# Patient Record
Sex: Male | Born: 1965 | Race: White | Hispanic: No | Marital: Single | Smoking: Current every day smoker
Health system: Southern US, Community
[De-identification: ages and names within clinical notes are randomized; demographics above are authoritative.]

## PROBLEM LIST (undated history)

## (undated) DIAGNOSIS — I619 Nontraumatic intracerebral hemorrhage, unspecified: Secondary | ICD-10-CM

## (undated) DIAGNOSIS — B1920 Unspecified viral hepatitis C without hepatic coma: Secondary | ICD-10-CM

## (undated) DIAGNOSIS — J45909 Unspecified asthma, uncomplicated: Secondary | ICD-10-CM

## (undated) DIAGNOSIS — Z21 Asymptomatic human immunodeficiency virus [HIV] infection status: Secondary | ICD-10-CM

## (undated) DIAGNOSIS — B20 Human immunodeficiency virus [HIV] disease: Secondary | ICD-10-CM

## (undated) DIAGNOSIS — D66 Hereditary factor VIII deficiency: Secondary | ICD-10-CM

## (undated) HISTORY — PX: HERNIA REPAIR: SHX51

## (undated) HISTORY — PX: OTHER SURGICAL HISTORY: SHX169

## (undated) HISTORY — PX: MANDIBLE SURGERY: SHX707

---

## 1983-12-05 DIAGNOSIS — B20 Human immunodeficiency virus [HIV] disease: Secondary | ICD-10-CM

## 1983-12-05 HISTORY — DX: Human immunodeficiency virus (HIV) disease: B20

## 1998-12-04 DIAGNOSIS — I619 Nontraumatic intracerebral hemorrhage, unspecified: Secondary | ICD-10-CM

## 1998-12-04 HISTORY — DX: Nontraumatic intracerebral hemorrhage, unspecified: I61.9

## 2013-07-06 ENCOUNTER — Emergency Department (HOSPITAL_COMMUNITY)
Admission: EM | Admit: 2013-07-06 | Discharge: 2013-07-06 | Disposition: A | Discharge status: Home / Self Care | Payer: Self-pay | Attending: Emergency Medicine | Admitting: Emergency Medicine

## 2013-07-06 ENCOUNTER — Encounter (HOSPITAL_COMMUNITY): Payer: Self-pay | Admitting: Emergency Medicine

## 2013-07-06 ENCOUNTER — Emergency Department (HOSPITAL_COMMUNITY): Payer: Self-pay

## 2013-07-06 DIAGNOSIS — Z760 Encounter for issue of repeat prescription: Secondary | ICD-10-CM | POA: Insufficient documentation

## 2013-07-06 DIAGNOSIS — F17200 Nicotine dependence, unspecified, uncomplicated: Secondary | ICD-10-CM | POA: Insufficient documentation

## 2013-07-06 DIAGNOSIS — S0990XA Unspecified injury of head, initial encounter: Secondary | ICD-10-CM | POA: Insufficient documentation

## 2013-07-06 HISTORY — DX: Unspecified viral hepatitis C without hepatic coma: B19.20

## 2013-07-06 HISTORY — DX: Nontraumatic intracerebral hemorrhage, unspecified: I61.9

## 2013-07-06 HISTORY — DX: Unspecified asthma, uncomplicated: J45.909

## 2013-07-06 HISTORY — DX: Human immunodeficiency virus (HIV) disease: B20

## 2013-07-06 HISTORY — DX: Asymptomatic human immunodeficiency virus (hiv) infection status: Z21

## 2013-07-06 MED ORDER — ALBUTEROL SULFATE HFA 108 (90 BASE) MCG/ACT IN AERS: 2.0000 | INHALATION_SPRAY | RESPIRATORY_TRACT | Status: DC | PRN

## 2013-07-06 MED ORDER — RITONAVIR 100 MG PO CAPS: 100.0000 mg | ORAL_CAPSULE | Freq: Two times a day (BID) | ORAL | Status: DC

## 2013-07-06 MED ORDER — TEMAZEPAM 7.5 MG PO CAPS: 15.0000 mg | ORAL_CAPSULE | Freq: Every evening | ORAL | Status: DC | PRN

## 2013-07-06 MED ORDER — CYCLOBENZAPRINE HCL 10 MG PO TABS: 5.0000 mg | ORAL_TABLET | Freq: Two times a day (BID) | ORAL | Status: DC | PRN

## 2013-07-06 MED ORDER — DARUNAVIR ETHANOLATE 400 MG PO TABS: 800.0000 mg | ORAL_TABLET | Freq: Every day | ORAL | Status: DC

## 2013-07-06 MED ORDER — EMTRICITABINE-TENOFOVIR DF 200-300 MG PO TABS
1.0000 | ORAL_TABLET | Freq: Every day | ORAL | Status: DC
Start: 2013-07-06 — End: 2013-07-10

## 2013-07-06 MED ORDER — SULFAMETHOXAZOLE-TRIMETHOPRIM 800-160 MG PO TABS: 1.0000 | ORAL_TABLET | Freq: Every day | ORAL | Status: DC

## 2013-07-06 MED ORDER — OXYCODONE-ACETAMINOPHEN 5-325 MG PO TABS: ORAL_TABLET | ORAL | Status: DC

## 2013-07-06 NOTE — ED Provider Notes (Signed)
Medical screening examination/treatment/procedure(s) were performed by non-physician practitioner and as supervising physician I was immediately available for consultation/collaboration.   Charles B. Sheldon, MD 07/06/13 1805 
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
## 2013-07-10 ENCOUNTER — Emergency Department (HOSPITAL_COMMUNITY)
Admission: EM | Admit: 2013-07-10 | Discharge: 2013-07-10 | Disposition: A | Discharge status: Home / Self Care | Payer: Self-pay | Attending: Emergency Medicine | Admitting: Emergency Medicine

## 2013-07-10 ENCOUNTER — Encounter (HOSPITAL_COMMUNITY): Payer: Self-pay | Admitting: Emergency Medicine

## 2013-07-10 DIAGNOSIS — J45909 Unspecified asthma, uncomplicated: Secondary | ICD-10-CM | POA: Insufficient documentation

## 2013-07-10 DIAGNOSIS — Z8619 Personal history of other infectious and parasitic diseases: Secondary | ICD-10-CM | POA: Insufficient documentation

## 2013-07-10 DIAGNOSIS — F411 Generalized anxiety disorder: Secondary | ICD-10-CM | POA: Insufficient documentation

## 2013-07-10 DIAGNOSIS — Z760 Encounter for issue of repeat prescription: Secondary | ICD-10-CM | POA: Insufficient documentation

## 2013-07-10 DIAGNOSIS — Z8679 Personal history of other diseases of the circulatory system: Secondary | ICD-10-CM | POA: Insufficient documentation

## 2013-07-10 DIAGNOSIS — F17200 Nicotine dependence, unspecified, uncomplicated: Secondary | ICD-10-CM | POA: Insufficient documentation

## 2013-07-10 DIAGNOSIS — R0602 Shortness of breath: Secondary | ICD-10-CM | POA: Insufficient documentation

## 2013-07-10 DIAGNOSIS — Z5900 Homelessness unspecified: Secondary | ICD-10-CM | POA: Insufficient documentation

## 2013-07-10 DIAGNOSIS — R4589 Other symptoms and signs involving emotional state: Secondary | ICD-10-CM

## 2013-07-10 DIAGNOSIS — B20 Human immunodeficiency virus [HIV] disease: Secondary | ICD-10-CM | POA: Insufficient documentation

## 2013-07-10 DIAGNOSIS — Z79899 Other long term (current) drug therapy: Secondary | ICD-10-CM | POA: Insufficient documentation

## 2013-07-10 MED ORDER — CYCLOBENZAPRINE HCL 10 MG PO TABS
5.0000 mg | ORAL_TABLET | Freq: Once | ORAL | Status: AC
  Administered 2013-07-10: 5 mg via ORAL
  Filled 2013-07-10: qty 1

## 2013-07-10 MED ORDER — OXYCODONE-ACETAMINOPHEN 5-325 MG PO TABS
2.0000 | ORAL_TABLET | Freq: Once | ORAL | Status: AC
  Administered 2013-07-10: 2 via ORAL
  Filled 2013-07-10: qty 2

## 2013-07-10 MED ORDER — ALBUTEROL SULFATE HFA 108 (90 BASE) MCG/ACT IN AERS
2.0000 | INHALATION_SPRAY | RESPIRATORY_TRACT | Status: DC | PRN
  Administered 2013-07-10: 2 via RESPIRATORY_TRACT
  Filled 2013-07-10: qty 6.7

## 2013-07-10 MED ORDER — SULFAMETHOXAZOLE-TMP DS 800-160 MG PO TABS
1.0000 | ORAL_TABLET | Freq: Once | ORAL | Status: AC
  Administered 2013-07-10: 1 via ORAL
  Filled 2013-07-10: qty 1
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
## 2013-07-11 NOTE — ED Provider Notes (Signed)
Medical screening examination/treatment/procedure(s) were performed by non-physician practitioner and as supervising physician I was immediately available for consultation/collaboration.   Shanna Cisco, MD 07/11/13 1135

## 2013-07-14 DIAGNOSIS — J45909 Unspecified asthma, uncomplicated: Secondary | ICD-10-CM | POA: Diagnosis present

## 2013-07-14 DIAGNOSIS — R7309 Other abnormal glucose: Secondary | ICD-10-CM | POA: Diagnosis present

## 2013-07-14 DIAGNOSIS — R42 Dizziness and giddiness: Secondary | ICD-10-CM | POA: Diagnosis present

## 2013-07-14 DIAGNOSIS — Z886 Allergy status to analgesic agent status: Secondary | ICD-10-CM

## 2013-07-14 DIAGNOSIS — B20 Human immunodeficiency virus [HIV] disease: Principal | ICD-10-CM | POA: Diagnosis present

## 2013-07-14 DIAGNOSIS — R05 Cough: Secondary | ICD-10-CM | POA: Diagnosis present

## 2013-07-14 DIAGNOSIS — Z8673 Personal history of transient ischemic attack (TIA), and cerebral infarction without residual deficits: Secondary | ICD-10-CM

## 2013-07-14 DIAGNOSIS — B1920 Unspecified viral hepatitis C without hepatic coma: Secondary | ICD-10-CM | POA: Diagnosis present

## 2013-07-14 DIAGNOSIS — R059 Cough, unspecified: Secondary | ICD-10-CM | POA: Diagnosis present

## 2013-07-14 DIAGNOSIS — Z8249 Family history of ischemic heart disease and other diseases of the circulatory system: Secondary | ICD-10-CM

## 2013-07-14 DIAGNOSIS — F17200 Nicotine dependence, unspecified, uncomplicated: Secondary | ICD-10-CM | POA: Diagnosis present

## 2013-07-14 DIAGNOSIS — IMO0002 Reserved for concepts with insufficient information to code with codable children: Secondary | ICD-10-CM

## 2013-07-14 DIAGNOSIS — R209 Unspecified disturbances of skin sensation: Secondary | ICD-10-CM | POA: Diagnosis present

## 2013-07-14 DIAGNOSIS — D61818 Other pancytopenia: Secondary | ICD-10-CM | POA: Diagnosis present

## 2013-07-15 ENCOUNTER — Emergency Department (HOSPITAL_COMMUNITY): Payer: Self-pay

## 2013-07-15 ENCOUNTER — Inpatient Hospital Stay (HOSPITAL_COMMUNITY)
Admission: EM | Admit: 2013-07-15 | Discharge: 2013-07-16 | DRG: 977 | Disposition: A | Discharge status: Home / Self Care | Payer: MEDICAID | Attending: Internal Medicine | Admitting: Internal Medicine

## 2013-07-15 ENCOUNTER — Observation Stay (HOSPITAL_COMMUNITY): Payer: Self-pay

## 2013-07-15 ENCOUNTER — Encounter (HOSPITAL_COMMUNITY): Payer: Self-pay | Admitting: Emergency Medicine

## 2013-07-15 DIAGNOSIS — R200 Anesthesia of skin: Secondary | ICD-10-CM | POA: Diagnosis present

## 2013-07-15 DIAGNOSIS — R209 Unspecified disturbances of skin sensation: Secondary | ICD-10-CM

## 2013-07-15 DIAGNOSIS — B20 Human immunodeficiency virus [HIV] disease: Secondary | ICD-10-CM

## 2013-07-15 DIAGNOSIS — Z21 Asymptomatic human immunodeficiency virus [HIV] infection status: Secondary | ICD-10-CM

## 2013-07-15 DIAGNOSIS — I6789 Other cerebrovascular disease: Secondary | ICD-10-CM

## 2013-07-15 LAB — LIPID PANEL
HDL: 30 mg/dL — ABNORMAL LOW (ref >39)
LDL Cholesterol: 44 mg/dL (ref 0–99)
Triglycerides: 288 mg/dL — ABNORMAL HIGH (ref <150)
VLDL: 58 mg/dL — ABNORMAL HIGH (ref 0–40)

## 2013-07-15 LAB — CBC
HCT: 33.4 % — ABNORMAL LOW (ref 39.0–52.0)
MCV: 94.6 fL (ref 78.0–100.0)
RBC: 3.53 MIL/uL — ABNORMAL LOW (ref 4.22–5.81)
WBC: 2.6 10*3/uL — ABNORMAL LOW (ref 4.0–10.5)

## 2013-07-15 LAB — COMPREHENSIVE METABOLIC PANEL
AST: 206 U/L — ABNORMAL HIGH (ref 0–37)
Albumin: 3.1 g/dL — ABNORMAL LOW (ref 3.5–5.2)
Calcium: 8.2 mg/dL — ABNORMAL LOW (ref 8.4–10.5)
Chloride: 100 mEq/L (ref 96–112)
Creatinine, Ser: 0.77 mg/dL (ref 0.50–1.35)
Total Bilirubin: 1.2 mg/dL (ref 0.3–1.2)

## 2013-07-15 LAB — CBC WITH DIFFERENTIAL/PLATELET
Basophils Relative: 3 % — ABNORMAL HIGH (ref 0–1)
Eosinophils Relative: 10 % — ABNORMAL HIGH (ref 0–5)
Hemoglobin: 12.6 g/dL — ABNORMAL LOW (ref 13.0–17.0)
Lymphs Abs: 0.7 10*3/uL (ref 0.7–4.0)
MCH: 34.8 pg — ABNORMAL HIGH (ref 26.0–34.0)
MCV: 95.9 fL (ref 78.0–100.0)
Monocytes Absolute: 0.4 10*3/uL (ref 0.1–1.0)
Neutro Abs: 0.6 10*3/uL — ABNORMAL LOW (ref 1.7–7.7)
RBC: 3.62 MIL/uL — ABNORMAL LOW (ref 4.22–5.81)

## 2013-07-15 LAB — POCT I-STAT, CHEM 8
BUN: 12 mg/dL (ref 6–23)
Calcium, Ion: 1.25 mmol/L — ABNORMAL HIGH (ref 1.12–1.23)
Chloride: 101 mEq/L (ref 96–112)
Glucose, Bld: 208 mg/dL — ABNORMAL HIGH (ref 70–99)
Potassium: 3.6 mEq/L (ref 3.5–5.1)

## 2013-07-15 MED ORDER — ENOXAPARIN SODIUM 40 MG/0.4ML ~~LOC~~ SOLN
40.0000 mg | Freq: Every day | SUBCUTANEOUS | Status: DC
  Administered 2013-07-15: 40 mg via SUBCUTANEOUS
  Filled 2013-07-15 (×2): qty 0.4

## 2013-07-15 MED ORDER — ADULT MULTIVITAMIN W/MINERALS CH
2.0000 | ORAL_TABLET | Freq: Every day | ORAL | Status: DC
  Administered 2013-07-15 – 2013-07-16 (×2): 2 via ORAL
  Filled 2013-07-15 (×2): qty 2

## 2013-07-15 MED ORDER — SULFAMETHOXAZOLE-TMP DS 800-160 MG PO TABS
1.0000 | ORAL_TABLET | Freq: Every day | ORAL | Status: DC
  Administered 2013-07-15 – 2013-07-16 (×2): 1 via ORAL
  Filled 2013-07-15 (×2): qty 1

## 2013-07-15 MED ORDER — HYPROMELLOSE (GONIOSCOPIC) 2.5 % OP SOLN: 1.0000 [drp] | OPHTHALMIC | Status: DC | PRN

## 2013-07-15 MED ORDER — SODIUM CHLORIDE 0.9 % IV SOLN
INTRAVENOUS | Status: AC
  Administered 2013-07-15: 05:00:00 via INTRAVENOUS

## 2013-07-15 MED ORDER — TEMAZEPAM 15 MG PO CAPS
15.0000 mg | ORAL_CAPSULE | Freq: Every evening | ORAL | Status: DC | PRN
  Administered 2013-07-15: 15 mg via ORAL
  Filled 2013-07-15: qty 1

## 2013-07-15 MED ORDER — SODIUM CHLORIDE 0.9 % IV SOLN
INTRAVENOUS | Status: DC
  Administered 2013-07-15: 02:00:00 via INTRAVENOUS

## 2013-07-15 MED ORDER — ALBUTEROL SULFATE HFA 108 (90 BASE) MCG/ACT IN AERS
2.0000 | INHALATION_SPRAY | RESPIRATORY_TRACT | Status: DC | PRN
Start: 2013-07-15 — End: 2013-07-16
  Administered 2013-07-16: 2 via RESPIRATORY_TRACT
  Filled 2013-07-15: qty 6.7

## 2013-07-15 MED ORDER — OXYCODONE-ACETAMINOPHEN 5-325 MG PO TABS
2.0000 | ORAL_TABLET | Freq: Four times a day (QID) | ORAL | Status: DC | PRN
  Administered 2013-07-15 – 2013-07-16 (×5): 2 via ORAL
  Filled 2013-07-15 (×5): qty 2

## 2013-07-15 MED ORDER — SENNOSIDES-DOCUSATE SODIUM 8.6-50 MG PO TABS
1.0000 | ORAL_TABLET | Freq: Every evening | ORAL | Status: DC | PRN
  Filled 2013-07-15: qty 1

## 2013-07-15 MED ORDER — SODIUM CHLORIDE 0.9 % IV SOLN: INTRAVENOUS | Status: DC

## 2013-07-15 MED ORDER — RITONAVIR 100 MG PO CAPS
100.0000 mg | ORAL_CAPSULE | Freq: Every day | ORAL | Status: DC
  Administered 2013-07-15 – 2013-07-16 (×2): 100 mg via ORAL
  Filled 2013-07-15 (×3): qty 1

## 2013-07-15 MED ORDER — DARUNAVIR ETHANOLATE 800 MG PO TABS
800.0000 mg | ORAL_TABLET | Freq: Every day | ORAL | Status: DC
  Administered 2013-07-15 – 2013-07-16 (×2): 800 mg via ORAL
  Filled 2013-07-15 (×3): qty 1

## 2013-07-15 MED ORDER — CYCLOBENZAPRINE HCL 5 MG PO TABS
5.0000 mg | ORAL_TABLET | Freq: Two times a day (BID) | ORAL | Status: DC | PRN
  Administered 2013-07-15 – 2013-07-16 (×3): 5 mg via ORAL
  Filled 2013-07-15 (×7): qty 1

## 2013-07-15 MED ORDER — EMTRICITABINE-TENOFOVIR DF 200-300 MG PO TABS
1.0000 | ORAL_TABLET | Freq: Every day | ORAL | Status: DC
  Administered 2013-07-15 – 2013-07-16 (×2): 1 via ORAL
  Filled 2013-07-15 (×2): qty 1
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
## 2013-07-15 NOTE — Consult Note (Signed)
Reason for Consult: Recurrent episodes of electrical shocklike sensation involving his left side.  HPI:                                                                                                                                          Steven [name] is an 47 y.o. male with HIV infection, hepatitis C, asthma and history of cerebral hemorrhage, presenting with recurrent spells of brief electric-like shocks involving his left side for about one week. Symptoms involved arm and leg initially but now also involved left side of his face. Duration is one second or less. He's had one episode of feeling dizzy prior to onset of electric shocklike sensation. CT scan of his head showed signs of mild cortical atrophy and white matter ischemic changes. No acute findings were noted. Patient has not been on antiplatelet therapy and is allergic to aspirin.  Past Medical History  Diagnosis Date  . HIV (human immunodeficiency virus infection)   . Hepatitis C   . Asthma   . AIDS   . Cerebral hemorrhage     Past Surgical History  Procedure Laterality Date  . Hernia repair    . Mandible surgery    . Laporotomy      Family History  Problem Relation Age of Onset  . CAD Mother   . Stroke Mother     Social History:  reports that he has been smoking.  He does not have any smokeless tobacco history on file. He reports that he does not drink alcohol or use illicit drugs.  Allergies  Allergen Reactions  . Aspirin     MEDICATIONS:                                                                                                                     I have reviewed the patient's current medications.   ROS:  History obtained from the patient  General ROS: negative for - chills, fatigue, fever, night sweats; positive for weight loss Psychological ROS: negative for -  behavioral disorder, hallucinations, memory difficulties, mood swings or suicidal ideation Ophthalmic ROS: negative for - blurry vision, double vision, eye pain or loss of vision ENT ROS: negative for - epistaxis, nasal discharge, oral lesions, sore throat, tinnitus or vertigo Allergy and Immunology ROS: negative for - hives or itchy/watery eyes Hematological and Lymphatic ROS: negative for - bleeding problems, bruising or swollen lymph nodes Endocrine ROS: negative for - galactorrhea, hair pattern changes, polydipsia/polyuria or temperature intolerance Respiratory ROS: negative for - cough, hemoptysis, shortness of breath or wheezing Cardiovascular ROS: negative for - chest pain, dyspnea on exertion, edema or irregular heartbeat Gastrointestinal ROS: negative for - abdominal pain, diarrhea, hematemesis, nausea/vomiting or stool incontinence Genito-Urinary ROS: negative for - dysuria, hematuria, incontinence or urinary frequency/urgency Musculoskeletal ROS: negative for - joint swelling or muscular weakness Neurological ROS: as noted in HPI Dermatological ROS: negative for rash and skin lesion changes   Blood pressure 124/80, pulse 67, temperature 97.6 F (36.4 C), temperature source Oral, resp. rate 18, height 5\' 11"  (1.803 m), weight 77.52 kg (170 lb 14.4 oz), SpO2 100.00%.   Neurologic Examination:                                                                                                      Mental Status: Alert, oriented, thought content appropriate.  Speech fluent without evidence of aphasia. Able to follow commands without difficulty. Cranial Nerves: II-Visual fields were normal. III/IV/VI-Pupils were equal and reacted. Extraocular movements were full and conjugate.    V/VII-minimal left lower facial numbness; no facial weakness. VIII-normal. X-normal speech and symmetrical palatal movement. Motor: 5/5 bilaterally with normal tone and bulk Sensory: Normal throughout. Deep  Tendon Reflexes: Trace only and symmetric. Plantars: Flexor bilaterally Cerebellar: Normal finger-to-nose testing.  No results found for this basename: cbc, bmp, coags, chol, tri, ldl, hga1c    Results for orders placed during the hospital encounter of 07/15/13 (from the past 48 hour(s))  POCT I-STAT, CHEM 8     Status: Abnormal   Collection Time    07/15/13  1:41 AM      Result Value Range   Sodium 140  135 - 145 mEq/L   Potassium 3.6  3.5 - 5.1 mEq/L   Chloride 101  96 - 112 mEq/L   BUN 12  6 - 23 mg/dL   Creatinine, Ser 4.09  0.50 - 1.35 mg/dL   Glucose, Bld 811 (*) 70 - 99 mg/dL   Calcium, Ion 9.14 (*) 1.12 - 1.23 mmol/L   TCO2 25  0 - 100 mmol/L   Hemoglobin 13.3  13.0 - 17.0 g/dL   HCT 78.2  95.6 - 21.3 %  CBC     Status: Abnormal   Collection Time    07/15/13  1:42 AM      Result Value Range   WBC 2.6 (*) 4.0 - 10.5 K/uL   RBC 3.53 (*) 4.22 - 5.81 MIL/uL   Hemoglobin  12.3 (*) 13.0 - 17.0 g/dL   HCT 29.5 (*) 28.4 - 13.2 %   MCV 94.6  78.0 - 100.0 fL   MCH 34.8 (*) 26.0 - 34.0 pg   MCHC 36.8 (*) 30.0 - 36.0 g/dL   RDW 44.0  10.2 - 72.5 %   Platelets 139 (*) 150 - 400 K/uL    Dg Chest 2 View  07/15/2013   *RADIOLOGY REPORT*  Clinical Data: Cough.  CHEST - 2 VIEW  Comparison: No priors.  Findings: Lung volumes are normal.  No consolidative airspace disease.  No pleural effusions.  No pneumothorax.  No pulmonary nodule or mass noted.  Pulmonary vasculature and the cardiomediastinal silhouette are within normal limits.  IMPRESSION: 1. No radiographic evidence of acute cardiopulmonary disease.   Original Report Authenticated By: Trudie Reed, M.D.   Ct Head Wo Contrast  07/15/2013   *RADIOLOGY REPORT*  Clinical Data: Unsteady gait, altered mental status  CT HEAD WITHOUT CONTRAST  Technique:  Contiguous axial images were obtained from the base of the skull through the vertex without contrast.  Comparison: 07/06/2013  Findings: Cortical volume loss noted with proportional  ventricular prominence.  Periventricular white matter hypodensity likely indicates small vessel ischemic change.  No acute hemorrhage, acute infarction, or mass lesion is identified.  No midline shift. Orbits and paranasal sinuses are intact.  IMPRESSION: No acute intracranial finding.  Chronic findings as above.   Original Report Authenticated By: Christiana Pellant, M.D.     Assessment/Plan: Etiology for presenting symptoms of brief positive sensory symptoms is unclear. New-onset focal seizure disorder will need . As well, recurrent TIAs as well as possible small right subcortical ischemic infarction will need to be ruled out.  Recommendations: 1. MRI of the brain to rule out possible acute stroke as well as indications of possible acute CNS infection. 2. EEG routine adult 3. Stroke workup if acute cerebral infarction is demonstrated on MRI 4. No anticonvulsant medication unless EEG shows indications of focal seizure activity, or increased risk for recurrent strokes.  C.R. Roseanne Reno, MD [facility] [phone]  07/15/2013, 5:55 AM
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
## 2013-07-15 NOTE — Progress Notes (Signed)
*  PRELIMINARY RESULTS* Vascular Ultrasound Carotid Duplex (Doppler) has been completed. Findings suggest 1-39% internal carotid artery stenosis bilaterally. Vertebral arteries are patent with antegrade flow.  07/15/2013 3:11 PM Gertie Fey, RVT, RDCS, RDMS

## 2013-07-15 NOTE — H&P (Signed)
[facility] History and Physical  [name] OZD:[id number] DOB: 1966/01/12 DOA: 07/15/2013  Referring physician: ER physician. PCP: No primary provider on file.  Specialists: Follows up with infectious disease clinic at [facility]  Chief Complaint: Left-sided numbness.  HPI: Steven [name] is a 47 y.o. male this history of HIV, hepatitis C and asthma presented to the ER because of left-sided numbness. Patient has been having recent facial numbness with tingling and numbness sensation in the left upper and lower extremities for last 4 days. Patient states that at times he has weakness of the extremities on the left side. Denies any headache visual changes difficulty speaking or swallowing. Patient states that he has had intracranial bleed in 2010. In the ER CT head was negative for any acute. Patient has been admitted for further management. Patient otherwise denies any chest pain shortness of breath nausea vomiting abdominal pain fever chills. Patient states that his last CD4 count done last month was less than 50.  Review of Systems: As presented in the history of presenting illness, rest negative.  Past Medical History  Diagnosis Date  . HIV (human immunodeficiency virus infection)   . Hepatitis C   . Asthma   . AIDS   . Cerebral hemorrhage    Past Surgical History  Procedure Laterality Date  . Hernia repair    . Mandible surgery    . Laporotomy     Social History:  reports that he has been smoking.  He does not have any smokeless tobacco history on file. He reports that he does not drink alcohol or use illicit drugs. Homeless. where does patient live-- Can do ADLs. Can patient participate in ADLs?  Allergies  Allergen Reactions  . Aspirin     Family History  Problem Relation Age of Onset  . CAD Mother   . Stroke Mother       Prior to Admission medications   Medication Sig Start Date End Date Taking? Authorizing Provider  albuterol (PROVENTIL  HFA;VENTOLIN HFA) 108 (90 BASE) MCG/ACT inhaler Inhale 2 puffs into the lungs every 2 (two) hours as needed for wheezing or shortness of breath (cough). 07/06/13  Yes Nicole Pisciotta, PA-C  cyclobenzaprine (FLEXERIL) 10 MG tablet Take 0.5 tablets (5 mg total) by mouth 2 (two) times daily as needed for muscle spasms. 07/06/13  Yes Nicole Pisciotta, PA-C  darunavir (PREZISTA) 400 MG tablet Take 2 tablets (800 mg total) by mouth daily with breakfast. 07/06/13  Yes Nicole Pisciotta, PA-C  emtricitabine-tenofovir (TRUVADA) 200-300 MG per tablet Take 1 tablet by mouth daily.   Yes Historical Provider, MD  hydroxypropyl methylcellulose (ISOPTO TEARS) 2.5 % ophthalmic solution Place 1 drop into both eyes as needed (for dryness).   Yes Historical Provider, MD  Multiple Vitamin (MULTIVITAMIN WITH MINERALS) TABS Take 2 tablets by mouth daily.   Yes Historical Provider, MD  oxyCODONE (OXY IR/ROXICODONE) 5 MG immediate release tablet Take 5 mg by mouth every 8 (eight) hours as needed for pain.   Yes Historical Provider, MD  oxyCODONE-acetaminophen (PERCOCET/ROXICET) 5-325 MG per tablet Take 2 tablets by mouth every 6 (six) hours as needed for pain.   Yes Historical Provider, MD  ritonavir (NORVIR) 100 MG capsule Take 100 mg by mouth daily.   Yes Historical Provider, MD  sulfamethoxazole-trimethoprim (SEPTRA DS) 800-160 MG per tablet Take 1 tablet by mouth daily. 07/06/13  Yes Nicole Pisciotta, PA-C  temazepam (RESTORIL) 7.5 MG capsule Take 2 capsules (15 mg total) by mouth at bedtime as needed for  sleep. 07/06/13  Yes Wynetta Emery, PA-C   Physical Exam: Filed Vitals:   07/15/13 0236 07/15/13 0300 07/15/13 0330 07/15/13 0345  BP:  120/89 131/80 145/86  Pulse:  59 53 69  Temp: 98.1 F (36.7 C)     TempSrc:      Resp:      SpO2:  98% 99% 98%     General:  Well-developed well-nourished.  Eyes: Anicteric no pallor.  ENT: No discharge from ears eyes nose mouth. Mild thrush seen.  Neck: No mass felt. No neck  rigidity.  Cardiovascular: S1-S2 heard.  Respiratory: No rhonchi or crepitations.  Abdomen: Soft nontender bowel sounds present.  Skin: No rash.  Musculoskeletal: No edema.  Psychiatric: Appears normal.  Neurologic: Alert awake oriented to time place and person. Moves all extremities.  Labs on Admission:  Basic Metabolic Panel:  Recent Labs Lab 07/15/13 0141  NA 140  K 3.6  CL 101  GLUCOSE 208*  BUN 12  CREATININE 0.80   Liver Function Tests: No results found for this basename: AST, ALT, ALKPHOS, BILITOT, PROT, ALBUMIN,  in the last 168 hours No results found for this basename: LIPASE, AMYLASE,  in the last 168 hours No results found for this basename: AMMONIA,  in the last 168 hours CBC:  Recent Labs Lab 07/15/13 0141 07/15/13 0142  WBC  --  2.6*  HGB 13.3 12.3*  HCT 39.0 33.4*  MCV  --  94.6  PLT  --  139*   Cardiac Enzymes: No results found for this basename: CKTOTAL, CKMB, CKMBINDEX, TROPONINI,  in the last 168 hours  BNP (last 3 results) No results found for this basename: PROBNP,  in the last 8760 hours CBG: No results found for this basename: GLUCAP,  in the last 168 hours  Radiological Exams on Admission: Dg Chest 2 View  07/15/2013   *RADIOLOGY REPORT*  Clinical Data: Cough.  CHEST - 2 VIEW  Comparison: No priors.  Findings: Lung volumes are normal.  No consolidative airspace disease.  No pleural effusions.  No pneumothorax.  No pulmonary nodule or mass noted.  Pulmonary vasculature and the cardiomediastinal silhouette are within normal limits.  IMPRESSION: 1. No radiographic evidence of acute cardiopulmonary disease.   Original Report Authenticated By: Trudie Reed, M.D.   Ct Head Wo Contrast  07/15/2013   *RADIOLOGY REPORT*  Clinical Data: Unsteady gait, altered mental status  CT HEAD WITHOUT CONTRAST  Technique:  Contiguous axial images were obtained from the base of the skull through the vertex without contrast.  Comparison: 07/06/2013   Findings: Cortical volume loss noted with proportional ventricular prominence.  Periventricular white matter hypodensity likely indicates small vessel ischemic change.  No acute hemorrhage, acute infarction, or mass lesion is identified.  No midline shift. Orbits and paranasal sinuses are intact.  IMPRESSION: No acute intracranial finding.  Chronic findings as above.   Original Report Authenticated By: Christiana Pellant, M.D.     Assessment/Plan Principal Problem:   Numbness on left side Active Problems:   HIV (human immunodeficiency virus infection)   1. Left-sided numbness - at this time patient has been admitted for ruling out any CVA or TIA and other neurological causes given the patient's history of HIV. Patient will be placed on neurochecks and swallow evaluation. MRI brain has been ordered. 2-D echo and carotid Doppler. Patient is allergic to aspirin. Neurology has been consulted we'll follow their recommendations. 2. History of HIV - per patient last CD4 count was less than 50. Continue present  anti-retroviral therapy. 3. Pancytopenia - probably secondary to HIV and also patient drinks alcohol. Patient states that he drinks alcohol only once or twice a week. 4. Tobacco abuse - advised to quit smoking. 5. History of asthma - presently not wheezing. 6. Hyperglycemia - check hemoglobin A1c.    Code Status: Full code.  Family Communication: None.  Disposition Plan: Admit to inpatient.    Knoah Nedeau N. [facility] Pager [phone].  If 7PM-7AM, please contact night-coverage [email or web address] Password [facility] 07/15/2013, 4:24 AM

## 2013-07-15 NOTE — Progress Notes (Signed)
Same day note  H/P from this AM reviewed. I agree with assessment and plan.  Pt presented with L sided weakness. Neurology consulted.  L sided weakness: - MRI pending - EEG per Neurology - carotids, 2D echo pending - PT/OT/SLP HIV: - Cont HIV meds Pancytopenia:  - Monitor for now Tobacco abuse Hx asthma

## 2013-07-15 NOTE — Progress Notes (Signed)
Chaplain Note:  Chaplain visited with pt who was resting in bed, awake, oriented, reading.  At pt's request, chaplain provided pt with inspirational literature and provided spiritual comfort, support, and prayer.  Pt expressed appreciation for chaplain support.  Chaplain will follow up as needed.  07/15/13 1000  Clinical Encounter Type  Visited With Patient  Visit Type Spiritual support  Referral From Patient  Spiritual Encounters  Spiritual Needs Literature;Prayer;Emotional  Stress Factors  Patient Stress Factors Health changes;Lack of caregivers;Major life changes  Family Stress Factors Not reviewed (No family present)  Verdie Shire, Chaplain [phone]
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
## 2013-07-15 NOTE — Progress Notes (Signed)
Echo Lab  2D Echocardiogram completed.  Louden Houseworth L Malaiya Paczkowski, RDCS 07/15/2013 2:49 PM

## 2013-07-15 NOTE — Progress Notes (Signed)
Utilization review completed.  

## 2013-07-16 ENCOUNTER — Ambulatory Visit (HOSPITAL_COMMUNITY): Payer: Self-pay

## 2013-07-16 MED ORDER — GABAPENTIN 100 MG PO CAPS: 100.0000 mg | ORAL_CAPSULE | Freq: Three times a day (TID) | ORAL | Status: DC

## 2013-07-16 MED ORDER — TEMAZEPAM 15 MG PO CAPS: 15.0000 mg | ORAL_CAPSULE | Freq: Every evening | ORAL | Status: DC | PRN
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
## 2013-07-16 NOTE — Progress Notes (Signed)
PT Cancellation Note  Patient Details Name: Steven [name] MRN: [id number] DOB: [birth year]   Cancelled Treatment:    Reason Eval/Treat Not Completed: PT screened, no needs identified, will sign off -- pt up in room no difficulties with balance or gait, no problems or concerns about mobility.  Dressed and ready to leave.   Narda Amber [facility] 07/16/2013, 11:38 AM

## 2013-07-16 NOTE — Progress Notes (Signed)
NEURO HOSPITALIST PROGRESS NOTE   SUBJECTIVE:                                                                                                                        Patient remains symptom free, would like to leave.   OBJECTIVE:                                                                                                                           Vital signs in last 24 hours: Temp:  [97.8 F (36.6 C)-97.9 F (36.6 C)] 97.8 F (36.6 C) (08/13 0400) Pulse Rate:  [50-57] 53 (08/13 0400) Resp:  [18] 18 (08/13 0400) BP: (145-156)/(80-94) 145/88 mmHg (08/13 0400) SpO2:  [99 %-100 %] 100 % (08/13 0400)  Intake/Output from previous day: 08/12 0701 - 08/13 0700 In: -  Out: 800 [Urine:800] Intake/Output this shift:   Nutritional status: Cardiac  Past Medical History  Diagnosis Date  . HIV (human immunodeficiency virus infection)   . Hepatitis C   . Asthma   . AIDS   . Cerebral hemorrhage     Neurologic Exam:  Mental Status: Alert, oriented, thought content appropriate.  Speech fluent without evidence of aphasia.  Able to follow 3 step commands without difficulty. Cranial Nerves: II: Discs flat bilaterally; Visual fields grossly normal, pupils equal, round, reactive to light and accommodation III,IV, VI: ptosis not present, extra-ocular motions intact bilaterally V,VII: smile symmetric, facial light touch sensation normal bilaterally VIII: hearing normal bilaterally IX,X: gag reflex present XI: bilateral shoulder shrug XII: midline tongue extension Motor: Right : Upper extremity   5/5    Left:     Upper extremity   5/5  Lower extremity   5/5     Lower extremity   5/5 Tone and bulk:normal tone throughout; no atrophy noted Sensory: Pinprick and light touch intact throughout, bilaterally Deep Tendon Reflexes:  1+ symmetric Plantars: Right: downgoing   Left: downgoing Cerebellar: normal finger-to-nose,  normal heel-to-shin test Gait:  normal CV: pulses palpable throughout    Lab Results: Lab Results  Component Value Date/Time   CHOL 132 07/15/2013  6:55 AM   Lipid Panel  Recent Labs  07/15/13 0655  CHOL 132  TRIG 288*  HDL 30*  CHOLHDL 4.4  VLDL 58*  LDLCALC 44    Studies/Results: Dg Chest 2 View  07/15/2013   *RADIOLOGY REPORT*  Clinical Data: Cough.  CHEST - 2 VIEW  Comparison: No priors.  Findings: Lung volumes are normal.  No consolidative airspace disease.  No pleural effusions.  No pneumothorax.  No pulmonary nodule or mass noted.  Pulmonary vasculature and the cardiomediastinal silhouette are within normal limits.  IMPRESSION: 1. No radiographic evidence of acute cardiopulmonary disease.   Original Report Authenticated By: Trudie Reed, M.D.   Ct Head Wo Contrast  07/15/2013   *RADIOLOGY REPORT*  Clinical Data: Unsteady gait, altered mental status  CT HEAD WITHOUT CONTRAST  Technique:  Contiguous axial images were obtained from the base of the skull through the vertex without contrast.  Comparison: 07/06/2013  Findings: Cortical volume loss noted with proportional ventricular prominence.  Periventricular white matter hypodensity likely indicates small vessel ischemic change.  No acute hemorrhage, acute infarction, or mass lesion is identified.  No midline shift. Orbits and paranasal sinuses are intact.  IMPRESSION: No acute intracranial finding.  Chronic findings as above.   Original Report Authenticated By: Christiana Pellant, M.D.   Mr Brain Wo Contrast  07/15/2013   *RADIOLOGY REPORT*  Clinical Data:  47 year old male with left side numbness.  HIV positive, hepatitis see.  Comparison: Head CTs without contrast 07/15/2013 and earlier.  MRI HEAD WITHOUT CONTRAST  Technique: Multiplanar, multiecho pulse sequences of the brain and surrounding structures were obtained according to standard protocol without intravenous contrast.  Findings: No restricted diffusion to suggest acute infarction.  No midline shift, mass  effect, evidence of mass lesion, ventriculomegaly, extra-axial collection or acute intracranial hemorrhage.  Cervicomedullary junction and pituitary are within normal limits.  Negative visualized cervical spine. Major intracranial vascular flow voids are preserved.  Generalized cerebral volume loss, age advanced and stable from comparison.  There is focal cortical encephalomalacia and associate abnormal white matter (mostly subcortical) T2 and FLAIR hyperintensity in the left temporal lobe (left inferior and superior temporal gyri), and inferior frontal lobes.  The latter is associated with chronic hemosiderin.  This is a subtle on the comparisons.  No other focal abnormal gray or white matter signal identified.  Normal bone marrow signal. Visualized orbit soft tissues are within normal limits.  Visualized paranasal sinuses and mastoids are clear.  Grossly normal visualized internal auditory structures. Negative scalp soft tissues.  IMPRESSION: 1. No acute intracranial abnormality. 2.  Age advanced generalized cerebral volume loss, favor HIV related. 3. Left temporal lobe and the inferior bifrontal encephalomalacia, the latter with hemosiderin.  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 4.  MRA findings are below.  MRA HEAD WITHOUT CONTRAST  Technique: Angiographic images of the Circle of Willis were obtained using MRA technique without  intravenous contrast.  Findings: Study is intermittently degraded by motion artifact despite repeated imaging attempts.  Antegrade flow in codominant appearing distal vertebral arteries. Patent vertebrobasilar junction.  PICA origins appear patent.  No basilar artery stenosis.  Incidental duplicated right SCA.  SCA and PCA origins are within normal limits.  Posterior communicating arteries are diminutive or absent.  Bilateral PCA branches are within normal limits.  Antegrade flow in both ICA siphons.  No definite ICA stenosis. Ophthalmic artery origins are within  normal limits.  Normal carotid termini, MCA and ACA origins.  Diminutive anterior communicating artery.  Visualized ACA branches are within normal limits.  Visualized bilateral MCA branches are within normal limits.  IMPRESSION: Negative intracranial  MRA, intermittently degraded by motion.   Original Report Authenticated By: Erskine Speed, M.D.   Mr Mra Head/brain Wo Cm  07/15/2013   *RADIOLOGY REPORT*  Clinical Data:  47 year old male with left side numbness.  HIV positive, hepatitis see.  Comparison: Head CTs without contrast 07/15/2013 and earlier.  MRI HEAD WITHOUT CONTRAST  Technique: Multiplanar, multiecho pulse sequences of the brain and surrounding structures were obtained according to standard protocol without intravenous contrast.  Findings: No restricted diffusion to suggest acute infarction.  No midline shift, mass effect, evidence of mass lesion, ventriculomegaly, extra-axial collection or acute intracranial hemorrhage.  Cervicomedullary junction and pituitary are within normal limits.  Negative visualized cervical spine. Major intracranial vascular flow voids are preserved.  Generalized cerebral volume loss, age advanced and stable from comparison.  There is focal cortical encephalomalacia and associate abnormal white matter (mostly subcortical) T2 and FLAIR hyperintensity in the left temporal lobe (left inferior and superior temporal gyri), and inferior frontal lobes.  The latter is associated with chronic hemosiderin.  This is a subtle on the comparisons.  No other focal abnormal gray or white matter signal identified.  Normal bone marrow signal. Visualized orbit soft tissues are within normal limits.  Visualized paranasal sinuses and mastoids are clear.  Grossly normal visualized internal auditory structures. Negative scalp soft tissues.  IMPRESSION: 1. No acute intracranial abnormality. 2.  Age advanced generalized cerebral volume loss, favor HIV related. 3. Left temporal lobe and the inferior  bifrontal encephalomalacia, the latter with hemosiderin.  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 4.  MRA findings are below.  MRA HEAD WITHOUT CONTRAST  Technique: Angiographic images of the Circle of Willis were obtained using MRA technique without  intravenous contrast.  Findings: Study is intermittently degraded by motion artifact despite repeated imaging attempts.  Antegrade flow in codominant appearing distal vertebral arteries. Patent vertebrobasilar junction.  PICA origins appear patent.  No basilar artery stenosis.  Incidental duplicated right SCA.  SCA and PCA origins are within normal limits.  Posterior communicating arteries are diminutive or absent.  Bilateral PCA branches are within normal limits.  Antegrade flow in both ICA siphons.  No definite ICA stenosis. Ophthalmic artery origins are within normal limits.  Normal carotid termini, MCA and ACA origins.  Diminutive anterior communicating artery.  Visualized ACA branches are within normal limits.  Visualized bilateral MCA branches are within normal limits.  IMPRESSION: Negative intracranial MRA, intermittently degraded by motion.   Original Report Authenticated By: Erskine Speed, M.D.   Vascular Ultrasound  Carotid Duplex (Doppler) has been completed.  Findings suggest 1-39% internal carotid artery stenosis bilaterally. Vertebral arteries are patent with antegrade flow.  2 d echo: Study Conclusions  Left ventricle: The cavity size was normal. Systolic function was normal. Wall motion was normal; there were no regional wall motion abnormalities. Transthoracic echocardiography. M-mode, complete 2D,     MEDICATIONS  Scheduled: . darunavir  800 mg Oral Q breakfast  . emtricitabine-tenofovir  1 tablet Oral Daily  . enoxaparin (LOVENOX) injection  40 mg Subcutaneous Daily  . multivitamin with minerals   2 tablet Oral Daily  . ritonavir  100 mg Oral Q breakfast  . sulfamethoxazole-trimethoprim  1 tablet Oral Daily    ASSESSMENT/PLAN:                                                                                                              Etiology for presenting symptoms of brief positive sensory symptoms is unclear. MRI and MRA normal, EEG pending.  Patient has had no further episodes.  If EEG normal no further recommendations.     Assessment and plan discussed with with attending physician and they are in agreement.    Felicie Morn PA-C [facility] [phone]  07/16/2013, 9:45 AM

## 2013-07-17 ENCOUNTER — Encounter (HOSPITAL_COMMUNITY): Payer: Self-pay | Admitting: Emergency Medicine

## 2013-07-17 DIAGNOSIS — Z79899 Other long term (current) drug therapy: Secondary | ICD-10-CM | POA: Insufficient documentation

## 2013-07-17 DIAGNOSIS — R52 Pain, unspecified: Secondary | ICD-10-CM | POA: Insufficient documentation

## 2013-07-17 DIAGNOSIS — Z8619 Personal history of other infectious and parasitic diseases: Secondary | ICD-10-CM | POA: Insufficient documentation

## 2013-07-17 DIAGNOSIS — Z23 Encounter for immunization: Secondary | ICD-10-CM | POA: Insufficient documentation

## 2013-07-17 DIAGNOSIS — B20 Human immunodeficiency virus [HIV] disease: Secondary | ICD-10-CM | POA: Insufficient documentation

## 2013-07-17 DIAGNOSIS — F1010 Alcohol abuse, uncomplicated: Secondary | ICD-10-CM | POA: Insufficient documentation

## 2013-07-17 DIAGNOSIS — R55 Syncope and collapse: Secondary | ICD-10-CM | POA: Insufficient documentation

## 2013-07-17 DIAGNOSIS — Z8679 Personal history of other diseases of the circulatory system: Secondary | ICD-10-CM | POA: Insufficient documentation

## 2013-07-17 DIAGNOSIS — F17200 Nicotine dependence, unspecified, uncomplicated: Secondary | ICD-10-CM | POA: Insufficient documentation

## 2013-07-17 DIAGNOSIS — J45909 Unspecified asthma, uncomplicated: Secondary | ICD-10-CM | POA: Insufficient documentation

## 2013-07-17 LAB — COMPREHENSIVE METABOLIC PANEL
AST: 237 U/L — ABNORMAL HIGH (ref 0–37)
Albumin: 3.4 g/dL — ABNORMAL LOW (ref 3.5–5.2)
BUN: 10 mg/dL (ref 6–23)
CO2: 23 mEq/L (ref 19–32)
Calcium: 8.3 mg/dL — ABNORMAL LOW (ref 8.4–10.5)
Chloride: 92 mEq/L — ABNORMAL LOW (ref 96–112)
Creatinine, Ser: 0.86 mg/dL (ref 0.50–1.35)
GFR calc non Af Amer: >90 mL/min (ref >90)
Total Bilirubin: 1.6 mg/dL — ABNORMAL HIGH (ref 0.3–1.2)

## 2013-07-17 LAB — CBC WITH DIFFERENTIAL/PLATELET
Basophils Absolute: 0 10*3/uL (ref 0.0–0.1)
Basophils Relative: 0 % (ref 0–1)
Eosinophils Relative: 4 % (ref 0–5)
HCT: 34.5 % — ABNORMAL LOW (ref 39.0–52.0)
Hemoglobin: 12.4 g/dL — ABNORMAL LOW (ref 13.0–17.0)
MCHC: 35.9 g/dL (ref 30.0–36.0)
MCV: 95.6 fL (ref 78.0–100.0)
Monocytes Absolute: 0.4 10*3/uL (ref 0.1–1.0)
Monocytes Relative: 11 % (ref 3–12)
Neutro Abs: 1.9 10*3/uL (ref 1.7–7.7)
RDW: 13.2 % (ref 11.5–15.5)

## 2013-07-17 LAB — POCT I-STAT TROPONIN I: Troponin i, poc: 0 ng/mL (ref 0.00–0.08)

## 2013-07-17 NOTE — ED Notes (Signed)
PT. REPORTS SYNCOPAL EPISODE " PASSED OUT" THIS EVENING WITH CHEST PAIN AND GENERALIZED BODY ACHES , ETOH INTAKE THIS EVENING  , PT. STATED HE IS HOMELESS .

## 2013-07-18 ENCOUNTER — Emergency Department (HOSPITAL_COMMUNITY): Payer: Self-pay

## 2013-07-18 ENCOUNTER — Encounter (HOSPITAL_COMMUNITY): Payer: Self-pay | Admitting: Radiology

## 2013-07-18 ENCOUNTER — Emergency Department (HOSPITAL_COMMUNITY)
Admission: EM | Admit: 2013-07-18 | Discharge: 2013-07-18 | Disposition: A | Discharge status: Home / Self Care | Payer: Self-pay | Attending: Emergency Medicine | Admitting: Emergency Medicine

## 2013-07-18 DIAGNOSIS — R200 Anesthesia of skin: Secondary | ICD-10-CM

## 2013-07-18 DIAGNOSIS — F10929 Alcohol use, unspecified with intoxication, unspecified: Secondary | ICD-10-CM

## 2013-07-18 LAB — TROPONIN I: Troponin I: <0.3 ng/mL (ref <0.30)

## 2013-07-18 MED ORDER — TETANUS-DIPHTH-ACELL PERTUSSIS 5-2.5-18.5 LF-MCG/0.5 IM SUSP
0.5000 mL | Freq: Once | INTRAMUSCULAR | Status: AC
  Administered 2013-07-18: 0.5 mL via INTRAMUSCULAR
  Filled 2013-07-18: qty 0.5

## 2013-07-18 NOTE — ED Notes (Signed)
Pt given fluids.  

## 2013-07-18 NOTE — ED Notes (Signed)
NURSE FIRST ROUNDS : NURSE EXPLAINED DELAY / WAIT TIME AND PROCESS TO PT. ALERT AND ORIENTED . DENIES PAIN .

## 2013-07-18 NOTE — ED Notes (Signed)
Pt is unable to perform orthostatic pulse and blood pressure. The tech report to RN in charge.

## 2013-07-18 NOTE — ED Notes (Signed)
Pt given bus pass to return home.

## 2013-07-18 NOTE — ED Notes (Signed)
Attempted to ambulate pt. Pt unsteady on feet when standing and taking a few steps.

## 2013-07-18 NOTE — ED Provider Notes (Signed)
CSN: [id number]     Arrival date & time 07/17/13  2231 History     First MD Initiated Contact with Patient 07/18/13 [phone]     Chief Complaint  Patient presents with  . Loss of Consciousness   (Consider location/radiation/quality/duration/timing/severity/associated sxs/prior Treatment) HPI Comments: Intoxicated male brought in by EMS complaining of pain everywhere after "passing out" today. He is an extremely poor historian. He smells of alcohol. He admits to drinking. He states he has "full-blown AIDS". States he hurts all over the place. Record Review shows he was discharged yesterday after being admitted for dizziness and facial numbness.  The history is provided by the patient and the EMS personnel. The history is limited by the condition of the patient.    Past Medical History  Diagnosis Date  . HIV (human immunodeficiency virus infection)   . Hepatitis C   . Asthma   . AIDS   . Cerebral hemorrhage    Past Surgical History  Procedure Laterality Date  . Hernia repair    . Mandible surgery    . Laporotomy     Family History  Problem Relation Age of Onset  . CAD Mother   . Stroke Mother    History  Substance Use Topics  . Smoking status: Current Every Day Smoker -- 2.00 packs/day for 38 years    Types: Cigarettes  . Smokeless tobacco: Never Used  . Alcohol Use: Yes     Comment: occasional    Review of Systems  Unable to perform ROS: Mental status change  Cardiovascular: Positive for syncope.    Allergies  Aspirin  Home Medications   Current Outpatient Rx  Name  Route  Sig  Dispense  Refill  . albuterol (PROVENTIL HFA;VENTOLIN HFA) 108 (90 BASE) MCG/ACT inhaler   Inhalation   Inhale 2 puffs into the lungs every 2 (two) hours as needed for wheezing or shortness of breath (cough).   1 Inhaler   0   . cyclobenzaprine (FLEXERIL) 10 MG tablet   Oral   Take 0.5 tablets (5 mg total) by mouth 2 (two) times daily as needed for muscle spasms.   15 tablet   0    . darunavir (PREZISTA) 400 MG tablet   Oral   Take 2 tablets (800 mg total) by mouth daily with breakfast.   30 tablet   0   . emtricitabine-tenofovir (TRUVADA) 200-300 MG per tablet   Oral   Take 1 tablet by mouth daily.         Marland Kitchen gabapentin (NEURONTIN) 100 MG capsule   Oral   Take 1 capsule (100 mg total) by mouth 3 (three) times daily.   10 capsule   0   . hydroxypropyl methylcellulose (ISOPTO TEARS) 2.5 % ophthalmic solution   Both Eyes   Place 1 drop into both eyes as needed (for dryness).         . Multiple Vitamin (MULTIVITAMIN WITH MINERALS) TABS   Oral   Take 2 tablets by mouth daily.         . ritonavir (NORVIR) 100 MG capsule   Oral   Take 100 mg by mouth daily.         Marland Kitchen sulfamethoxazole-trimethoprim (SEPTRA DS) 800-160 MG per tablet   Oral   Take 1 tablet by mouth daily.   30 tablet   0   . temazepam (RESTORIL) 15 MG capsule   Oral   Take 1 capsule (15 mg total) by mouth at bedtime as needed  for sleep.   3 capsule   0   . temazepam (RESTORIL) 7.5 MG capsule   Oral   Take 2 capsules (15 mg total) by mouth at bedtime as needed for sleep.   8 capsule   0    BP 135/85  Pulse 61  Temp(Src) 98.1 F (36.7 C) (Oral)  Resp 14  SpO2 98% Physical Exam  Constitutional: He appears well-developed and well-nourished. No distress.  Not cooperative with exam  HENT:  Head: Normocephalic and atraumatic.  Mouth/Throat: Oropharynx is clear and moist. No oropharyngeal exudate.  Eyes: Conjunctivae and EOM are normal. Pupils are equal, round, and reactive to light.  Neck: Normal range of motion. Neck supple.  Cardiovascular: Normal rate, regular rhythm and normal heart sounds.   No murmur heard. Pulmonary/Chest: Effort normal and breath sounds normal. No respiratory distress.  Abdominal: Soft. There is no tenderness. There is no rebound and no guarding.  Musculoskeletal:  Abrasion right forearm, abrasion lumbar spine 5/5 strength in bilateral lower  extremities. Ankle plantar and dorsiflexion intact. Great toe extension intact bilaterally. +2 DP and PT pulses. +2 patellar reflexes bilaterally. Normal gait.   Neurological: He is alert. No cranial nerve deficit.  Skin: Skin is warm.    ED Course   Procedures (including critical care time)  Labs Reviewed  ETHANOL - Abnormal; Notable for the following:    Alcohol, Ethyl (B) 175 (*)    All other components within normal limits  CBC WITH DIFFERENTIAL - Abnormal; Notable for the following:    WBC 3.7 (*)    RBC 3.61 (*)    Hemoglobin 12.4 (*)    HCT 34.5 (*)    MCH 34.3 (*)    Platelets 136 (*)    All other components within normal limits  COMPREHENSIVE METABOLIC PANEL - Abnormal; Notable for the following:    Sodium 128 (*)    Chloride 92 (*)    Glucose, Bld 113 (*)    Calcium 8.3 (*)    Albumin 3.4 (*)    AST 237 (*)    ALT 111 (*)    Total Bilirubin 1.6 (*)    All other components within normal limits  TROPONIN I  POCT I-STAT TROPONIN I   Dg Chest 2 View  07/18/2013   *RADIOLOGY REPORT*  Clinical Data: Fall.  CHEST - 2 VIEW  Comparison: 07/15/2013.  Findings: No significant osseous abnormality.  Lungs are clear. No effusion or pneumothorax.  Cardiomediastinal size and contour are within normal limits.  The upper abdomen is unremarkable.  IMPRESSION: No evidence of acute cardiopulmonary disease.   Original Report Authenticated By: Tiburcio Pea   Dg Lumbar Spine Complete  07/18/2013   *RADIOLOGY REPORT*  Clinical Data: Loss of consciousness.  LUMBAR SPINE - COMPLETE 4+ VIEW  Comparison: None.  Findings: Anterior wedging of T12, 10-20% maximum.  No acute fracture line detected.  Diffuse, fairly symmetric degenerative disc disease throughout the lumbar spine, with levoscoliotic curvature. There is facet sclerosis and overgrowth in the lower lumbar regions.  IMPRESSION: 1.  Remote appearing T12 compression fracture with mild height loss.  2.  No evidence of lumbar spine fracture.  3.  Lumbar degenerative disc and facet disease.   Original Report Authenticated By: Tiburcio Pea   Ct Head Wo Contrast  07/18/2013   *RADIOLOGY REPORT*  Clinical Data:  Loss of consciousness.  CT HEAD WITHOUT CONTRAST CT CERVICAL SPINE WITHOUT CONTRAST  Technique:  Multidetector CT imaging of the head and cervical spine was  performed following the standard protocol without intravenous contrast.  Multiplanar CT image reconstructions of the cervical spine were also generated.  Comparison:  Head CT 07/15/2013.  CT HEAD  Findings: Mild cerebral and cerebellar atrophy.  Mild patchy and confluent areas of decreased attenuation throughout the deep and periventricular white matter of the cerebral hemispheres bilaterally, compatible with chronic microvascular ischemic disease. No acute intracranial abnormalities.  Specifically, no evidence of acute intracranial hemorrhage, no definite findings of acute/subacute cerebral ischemia, no mass, mass effect, hydrocephalus or abnormal intra or extra-axial fluid collections. Visualized paranasal sinuses and mastoids are well pneumatized.  No acute displaced skull fractures are identified.  IMPRESSION: 1.  No acute intracranial abnormalities. 2.  Mild cerebral and cerebellar atrophy with mild chronic microvascular ischemic changes in the cerebral white matter.  CT CERVICAL SPINE  Findings: No acute displaced fractures of the cervical spine. Alignment is anatomic.  Prevertebral soft tissues are normal. Visualized portions of the upper thorax are unremarkable.  IMPRESSION: No evidence of significant acute traumatic injury to the cervical spine.   Original Report Authenticated By: Trudie Reed, M.D.   Ct Cervical Spine Wo Contrast  07/18/2013   *RADIOLOGY REPORT*  Clinical Data:  Loss of consciousness.  CT HEAD WITHOUT CONTRAST CT CERVICAL SPINE WITHOUT CONTRAST  Technique:  Multidetector CT imaging of the head and cervical spine was performed following the standard protocol  without intravenous contrast.  Multiplanar CT image reconstructions of the cervical spine were also generated.  Comparison:  Head CT 07/15/2013.  CT HEAD  Findings: Mild cerebral and cerebellar atrophy.  Mild patchy and confluent areas of decreased attenuation throughout the deep and periventricular white matter of the cerebral hemispheres bilaterally, compatible with chronic microvascular ischemic disease. No acute intracranial abnormalities.  Specifically, no evidence of acute intracranial hemorrhage, no definite findings of acute/subacute cerebral ischemia, no mass, mass effect, hydrocephalus or abnormal intra or extra-axial fluid collections. Visualized paranasal sinuses and mastoids are well pneumatized.  No acute displaced skull fractures are identified.  IMPRESSION: 1.  No acute intracranial abnormalities. 2.  Mild cerebral and cerebellar atrophy with mild chronic microvascular ischemic changes in the cerebral white matter.  CT CERVICAL SPINE  Findings: No acute displaced fractures of the cervical spine. Alignment is anatomic.  Prevertebral soft tissues are normal. Visualized portions of the upper thorax are unremarkable.  IMPRESSION: No evidence of significant acute traumatic injury to the cervical spine.   Original Report Authenticated By: Trudie Reed, M.D.   1. Alcohol intoxication     MDM  Intoxicated male with questionable syncopal episode today. Uncooperative with history taking. Complains of diffuse pain.  Will need reassessment after sobriety.  Recent admission records reviewed. Patient had negative echocardiogram, MRI, MRA. She left before EEG was performed.  CT head and C-spine negative. Patient remains somnolent but arousable. He is unable to give any meaningful history. LFT elevation and labs consistent with alcohol abuse. Care transferred to Dr. Juleen China at sign out.   Date: 07/18/2013  Rate: 88  Rhythm: normal sinus rhythm  QRS Axis: normal  Intervals: normal  ST/T Wave  abnormalities: normal  Conduction Disutrbances:none  Narrative Interpretation:   Old EKG Reviewed: unchanged    Glynn Octave, MD 07/18/13 [phone]

## 2013-07-18 NOTE — ED Notes (Signed)
NURSE FIRST ROUNDS : UNABLE TO LOCATE PT. AT TRIAGE AND WAITING AREA SEVERAL TIMES.  

## 2013-07-20 ENCOUNTER — Emergency Department (HOSPITAL_COMMUNITY)
Admission: EM | Admit: 2013-07-20 | Discharge: 2013-07-21 | Disposition: A | Discharge status: Home / Self Care | Payer: Self-pay | Attending: Emergency Medicine | Admitting: Emergency Medicine

## 2013-07-20 ENCOUNTER — Encounter (HOSPITAL_COMMUNITY): Payer: Self-pay | Admitting: *Deleted

## 2013-07-20 DIAGNOSIS — M791 Myalgia, unspecified site: Secondary | ICD-10-CM

## 2013-07-20 DIAGNOSIS — G8929 Other chronic pain: Secondary | ICD-10-CM | POA: Insufficient documentation

## 2013-07-20 DIAGNOSIS — J45909 Unspecified asthma, uncomplicated: Secondary | ICD-10-CM | POA: Insufficient documentation

## 2013-07-20 DIAGNOSIS — Z79899 Other long term (current) drug therapy: Secondary | ICD-10-CM | POA: Insufficient documentation

## 2013-07-20 DIAGNOSIS — M129 Arthropathy, unspecified: Secondary | ICD-10-CM | POA: Insufficient documentation

## 2013-07-20 DIAGNOSIS — IMO0001 Reserved for inherently not codable concepts without codable children: Secondary | ICD-10-CM | POA: Insufficient documentation

## 2013-07-20 DIAGNOSIS — F17200 Nicotine dependence, unspecified, uncomplicated: Secondary | ICD-10-CM | POA: Insufficient documentation

## 2013-07-20 DIAGNOSIS — Z8619 Personal history of other infectious and parasitic diseases: Secondary | ICD-10-CM | POA: Insufficient documentation

## 2013-07-20 DIAGNOSIS — Z21 Asymptomatic human immunodeficiency virus [HIV] infection status: Secondary | ICD-10-CM | POA: Insufficient documentation

## 2013-07-20 DIAGNOSIS — Z8679 Personal history of other diseases of the circulatory system: Secondary | ICD-10-CM | POA: Insufficient documentation

## 2013-07-20 NOTE — ED Notes (Signed)
Pt requesting one dose of pain meds to get him through the night

## 2013-07-21 MED ORDER — OXYCODONE-ACETAMINOPHEN 5-325 MG PO TABS
2.0000 | ORAL_TABLET | Freq: Once | ORAL | Status: AC
  Administered 2013-07-21: 2 via ORAL
  Filled 2013-07-21: qty 2

## 2013-07-21 NOTE — ED Provider Notes (Signed)
CSN: [id number]     Arrival date & time 07/20/13  2341 History     First MD Initiated Contact with Patient 07/21/13 0000     Chief Complaint  Patient presents with  . generalized pain    (Consider location/radiation/quality/duration/timing/severity/associated sxs/prior Treatment) HPI  47 year old male with history of HIV/AIDS, hepatitis C, alcohol abuse presents complaining of generalized body aches. Patient states he has generalized body aches for many years. This symptom has been persistent. He was prescribed pain medication for it but unable to refill due to lack of money. He is scheduled to start his new job tomorrow but currently the pain is unbearable for him. Pain is throughout all body, chronic in nature. No associated fever, headache, cough, numbness, weakness, or rash. No specific treatment tried. He request for pain medication here only. Otherwise no other complaints. Patient does admits to drinking 2 beers today to help with the pain but it did not help.  Past Medical History  Diagnosis Date  . HIV (human immunodeficiency virus infection)   . Hepatitis C   . Asthma   . AIDS   . Cerebral hemorrhage    Past Surgical History  Procedure Laterality Date  . Hernia repair    . Mandible surgery    . Laporotomy     Family History  Problem Relation Age of Onset  . CAD Mother   . Stroke Mother    History  Substance Use Topics  . Smoking status: Current Every Day Smoker -- 2.00 packs/day for 38 years    Types: Cigarettes  . Smokeless tobacco: Never Used  . Alcohol Use: Yes     Comment: occasional    Review of Systems  Constitutional: Negative for fever.  Musculoskeletal: Positive for arthralgias.  Skin: Negative for rash.  Neurological: Negative for headaches.    Allergies  Aspirin  Home Medications   Current Outpatient Rx  Name  Route  Sig  Dispense  Refill  . albuterol (PROVENTIL HFA;VENTOLIN HFA) 108 (90 BASE) MCG/ACT inhaler   Inhalation   Inhale 2 puffs  into the lungs every 2 (two) hours as needed for wheezing or shortness of breath (cough).   1 Inhaler   0   . darunavir (PREZISTA) 400 MG tablet   Oral   Take 2 tablets (800 mg total) by mouth daily with breakfast.   30 tablet   0   . emtricitabine-tenofovir (TRUVADA) 200-300 MG per tablet   Oral   Take 1 tablet by mouth daily.         Marland Kitchen gabapentin (NEURONTIN) 100 MG capsule   Oral   Take 1 capsule (100 mg total) by mouth 3 (three) times daily.   10 capsule   0   . ritonavir (NORVIR) 100 MG capsule   Oral   Take 100 mg by mouth daily.         . temazepam (RESTORIL) 15 MG capsule   Oral   Take 1 capsule (15 mg total) by mouth at bedtime as needed for sleep.   3 capsule   0   . temazepam (RESTORIL) 7.5 MG capsule   Oral   Take 2 capsules (15 mg total) by mouth at bedtime as needed for sleep.   8 capsule   0    BP 145/110  Pulse 84  Temp(Src) 99 F (37.2 C) (Oral)  Resp 18  Wt 175 lb (79.379 kg)  BMI 24.42 kg/m2  SpO2 98% Physical Exam  Nursing note and vitals reviewed. Constitutional:  He is oriented to person, place, and time. He appears well-developed and well-nourished. He appears distressed (Tearful but nontoxic in appearance).  HENT:  Head: Normocephalic and atraumatic.  Mouth/Throat: Oropharynx is clear and moist.  Eyes: Conjunctivae are normal.  Neck: Neck supple.  Cardiovascular: Normal rate and regular rhythm.   Pulmonary/Chest: Effort normal and breath sounds normal.  Abdominal: Soft.  Musculoskeletal: He exhibits tenderness (Patient with global tenderness on palpation to all 4 extremities including chest and abdomen. No focal point tenderness.).  Neurological: He is alert and oriented to person, place, and time.  Skin: Skin is warm. No rash noted.  Psychiatric: He has a normal mood and affect.    ED Course   Procedures (including critical care time)   patient with chronic generalized tenderness requesting for one dose of pain medication  here. Patient acknowledged that he walks to the ER, and plan to stay in the waiting room Until tomorrow when he can start his new job. He has no emergent medical problem. I agree to give him 1 dose of pain medication here. Patient will refill his chronic pain medication once he can afford it.    Labs Reviewed - No data to display No results found. 1. Myalgia     MDM  BP 145/110  Pulse 84  Temp(Src) 99 F (37.2 C) (Oral)  Resp 18  Wt 175 lb (79.379 kg)  BMI 24.42 kg/m2  SpO2 98%   Fayrene Helper, PA-C 07/21/13 0040

## 2013-07-21 NOTE — ED Provider Notes (Signed)
Medical screening examination/treatment/procedure(s) were performed by non-physician practitioner and as supervising physician I was immediately available for consultation/collaboration.   Tyris Eliot, MD 07/21/13 0046 

## 2013-07-24 DIAGNOSIS — Z79899 Other long term (current) drug therapy: Secondary | ICD-10-CM | POA: Insufficient documentation

## 2013-07-24 DIAGNOSIS — R52 Pain, unspecified: Secondary | ICD-10-CM | POA: Insufficient documentation

## 2013-07-24 DIAGNOSIS — Z5900 Homelessness unspecified: Secondary | ICD-10-CM | POA: Insufficient documentation

## 2013-07-24 DIAGNOSIS — Z8619 Personal history of other infectious and parasitic diseases: Secondary | ICD-10-CM | POA: Insufficient documentation

## 2013-07-24 DIAGNOSIS — F17200 Nicotine dependence, unspecified, uncomplicated: Secondary | ICD-10-CM | POA: Insufficient documentation

## 2013-07-24 DIAGNOSIS — G8929 Other chronic pain: Secondary | ICD-10-CM | POA: Insufficient documentation

## 2013-07-24 DIAGNOSIS — L988 Other specified disorders of the skin and subcutaneous tissue: Secondary | ICD-10-CM | POA: Insufficient documentation

## 2013-07-24 DIAGNOSIS — Z8669 Personal history of other diseases of the nervous system and sense organs: Secondary | ICD-10-CM | POA: Insufficient documentation

## 2013-07-24 DIAGNOSIS — R10817 Generalized abdominal tenderness: Secondary | ICD-10-CM | POA: Insufficient documentation

## 2013-07-24 DIAGNOSIS — Z9889 Other specified postprocedural states: Secondary | ICD-10-CM | POA: Insufficient documentation

## 2013-07-24 DIAGNOSIS — J45909 Unspecified asthma, uncomplicated: Secondary | ICD-10-CM | POA: Insufficient documentation

## 2013-07-24 DIAGNOSIS — B20 Human immunodeficiency virus [HIV] disease: Secondary | ICD-10-CM | POA: Insufficient documentation

## 2013-07-25 ENCOUNTER — Emergency Department (HOSPITAL_COMMUNITY)
Admission: EM | Admit: 2013-07-25 | Discharge: 2013-07-25 | Disposition: A | Discharge status: Home / Self Care | Payer: Self-pay | Attending: Emergency Medicine | Admitting: Emergency Medicine

## 2013-07-25 ENCOUNTER — Emergency Department (HOSPITAL_COMMUNITY)
Admission: EM | Admit: 2013-07-25 | Discharge: 2013-07-26 | Disposition: A | Discharge status: Home / Self Care | Payer: Self-pay | Attending: Emergency Medicine | Admitting: Emergency Medicine

## 2013-07-25 ENCOUNTER — Encounter (HOSPITAL_COMMUNITY): Payer: Self-pay

## 2013-07-25 ENCOUNTER — Encounter (HOSPITAL_COMMUNITY): Payer: Self-pay | Admitting: *Deleted

## 2013-07-25 DIAGNOSIS — Z79899 Other long term (current) drug therapy: Secondary | ICD-10-CM | POA: Insufficient documentation

## 2013-07-25 DIAGNOSIS — F17200 Nicotine dependence, unspecified, uncomplicated: Secondary | ICD-10-CM | POA: Insufficient documentation

## 2013-07-25 DIAGNOSIS — G8929 Other chronic pain: Secondary | ICD-10-CM | POA: Insufficient documentation

## 2013-07-25 DIAGNOSIS — Z21 Asymptomatic human immunodeficiency virus [HIV] infection status: Secondary | ICD-10-CM | POA: Insufficient documentation

## 2013-07-25 DIAGNOSIS — Z8679 Personal history of other diseases of the circulatory system: Secondary | ICD-10-CM | POA: Insufficient documentation

## 2013-07-25 DIAGNOSIS — Z8619 Personal history of other infectious and parasitic diseases: Secondary | ICD-10-CM | POA: Insufficient documentation

## 2013-07-25 DIAGNOSIS — IMO0001 Reserved for inherently not codable concepts without codable children: Secondary | ICD-10-CM | POA: Insufficient documentation

## 2013-07-25 DIAGNOSIS — J45909 Unspecified asthma, uncomplicated: Secondary | ICD-10-CM | POA: Insufficient documentation

## 2013-07-25 MED ORDER — OXYCODONE HCL 5 MG PO TABS
10.0000 mg | ORAL_TABLET | Freq: Once | ORAL | Status: AC
  Administered 2013-07-25: 10 mg via ORAL
  Filled 2013-07-25: qty 2

## 2013-07-25 MED ORDER — OXYCODONE-ACETAMINOPHEN 5-325 MG PO TABS
2.0000 | ORAL_TABLET | Freq: Once | ORAL | Status: AC
  Administered 2013-07-26: 2 via ORAL
  Filled 2013-07-25: qty 2
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
## 2013-07-25 NOTE — ED Notes (Signed)
Pt also complains of a rash mainly on his upper left arm and some on his legs

## 2013-07-25 NOTE — ED Notes (Signed)
Pt also states that he has a job that he needs to go to in the morning and doesn't want to stay here
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
## 2013-07-25 NOTE — ED Provider Notes (Signed)
CSN: [id number]     Arrival date & time 07/25/13  2254 History  This chart was scribed for non-physician practitioner Earley Favor, NP working with Enid Skeens, MD by Dorothey Baseman, ED Scribe. This patient was seen in room WTR7/WTR7 and the patient's care was started at 11:49 PM.     Chief Complaint  Patient presents with  . Generalized Body Aches    The history is provided by the patient. No language interpreter was used.   HPI Comments: Steven [name] is a 47 y.o. male who presents to the Emergency Department complaining of chronic myalgia and requesting his chronic pain medication that he states he has run out of. He reports that he has an appointment with his PCP on Monday 08/25 at [facility]. Patient was rude, aggravated, and verbally aggressive during visit. Patient has a history of HIV/AIDS and Hepatitis C.  Past Medical History  Diagnosis Date  . HIV (human immunodeficiency virus infection)   . Hepatitis C   . Asthma   . AIDS   . Cerebral hemorrhage    Past Surgical History  Procedure Laterality Date  . Hernia repair    . Mandible surgery    . Laporotomy     Family History  Problem Relation Age of Onset  . CAD Mother   . Stroke Mother    History  Substance Use Topics  . Smoking status: Current Every Day Smoker -- 2.00 packs/day for 38 years    Types: Cigarettes  . Smokeless tobacco: Never Used  . Alcohol Use: Yes     Comment: occasional    Review of Systems  Constitutional: Negative for fatigue.  Gastrointestinal: Negative for nausea and vomiting.  Musculoskeletal: Positive for myalgias.  All other systems reviewed and are negative.    Allergies  Aspirin  Home Medications   Current Outpatient Rx  Name  Route  Sig  Dispense  Refill  . albuterol (PROVENTIL HFA;VENTOLIN HFA) 108 (90 BASE) MCG/ACT inhaler   Inhalation   Inhale 2 puffs into the lungs every 2 (two) hours as needed for wheezing or shortness of breath (cough).   1 Inhaler   0    . cyclobenzaprine (FLEXERIL) 5 MG tablet   Oral   Take 5 mg by mouth 3 (three) times daily as needed for muscle spasms (muscle spams).         . darunavir (PREZISTA) 400 MG tablet   Oral   Take 2 tablets (800 mg total) by mouth daily with breakfast.   30 tablet   0   . emtricitabine-tenofovir (TRUVADA) 200-300 MG per tablet   Oral   Take 1 tablet by mouth daily.         Marland Kitchen gabapentin (NEURONTIN) 100 MG capsule   Oral   Take 1 capsule (100 mg total) by mouth 3 (three) times daily.   10 capsule   0   . hydroxypropyl methylcellulose (ISOPTO TEARS) 2.5 % ophthalmic solution   Both Eyes   Place 1 drop into both eyes as needed (dry eyes).         . Multiple Vitamin (MULTIVITAMIN) tablet   Oral   Take 1 tablet by mouth daily.         Marland Kitchen oxyCODONE-acetaminophen (PERCOCET/ROXICET) 5-325 MG per tablet   Oral   Take 2 tablets by mouth every 6 (six) hours as needed for pain (pain).          . ritonavir (NORVIR) 100 MG capsule   Oral  Take 100 mg by mouth daily.         Marland Kitchen sulfamethoxazole-trimethoprim (BACTRIM DS) 800-160 MG per tablet   Oral   Take 1 tablet by mouth 2 (two) times daily.         . temazepam (RESTORIL) 15 MG capsule   Oral   Take 1 capsule (15 mg total) by mouth at bedtime as needed for sleep.   3 capsule   0    Triage Vitals: BP 134/74  Pulse 72  Temp(Src) 98.3 F (36.8 C) (Oral)  Resp 20  SpO2 96%  Physical Exam  Nursing note and vitals reviewed. Constitutional: He is oriented to person, place, and time. He appears well-developed and well-nourished. No distress.  HENT:  Head: Normocephalic and atraumatic.  Eyes: EOM are normal.  Neck: Neck supple. No tracheal deviation present.  Cardiovascular: Normal rate.   Pulmonary/Chest: Effort normal. No respiratory distress.  Musculoskeletal: Normal range of motion.  Neurological: He is alert and oriented to person, place, and time.  Skin: Skin is warm and dry.  Psychiatric: He has a normal  mood and affect. His behavior is normal.    ED Course   Medications  oxyCODONE-acetaminophen (PERCOCET/ROXICET) 5-325 MG per tablet 2 tablet (not administered)   DIAGNOSTIC STUDIES: Oxygen Saturation is 96% on room air, normal by my interpretation.    COORDINATION OF CARE: 11:53PM- Ordered one dose of chronic pain medication. Patient states he will follow up with his PCP. Patient agreed to plan.  Procedures (including critical care time)  Labs Reviewed - No data to display  No results found.  1. Chronic pain     MDM   Will give one dose Percocet in ED recommend FU as scheduled   I personally performed the services described in this documentation, which was scribed in my presence. The recorded information has been reviewed and is accurate.   Arman Filter, NP 07/26/13 [phone]

## 2013-07-25 NOTE — ED Notes (Signed)
Pt reports chronic generalized pain d/t AIDS, pt requesting one dose of pain medication and no prescriptions, has an appointment w/ PCP on Monday at [facility].

## 2013-07-26 NOTE — ED Provider Notes (Signed)
Medical screening examination/treatment/procedure(s) were performed by non-physician practitioner and as supervising physician I was immediately available for consultation/collaboration.   Enid Skeens, MD 07/26/13 [phone]

## 2013-07-27 ENCOUNTER — Encounter (HOSPITAL_COMMUNITY): Payer: Self-pay

## 2013-07-27 ENCOUNTER — Emergency Department (HOSPITAL_COMMUNITY)
Admission: EM | Admit: 2013-07-27 | Discharge: 2013-07-27 | Disposition: A | Discharge status: Home / Self Care | Payer: MEDICAID | Attending: Emergency Medicine | Admitting: Emergency Medicine

## 2013-07-27 DIAGNOSIS — Z8619 Personal history of other infectious and parasitic diseases: Secondary | ICD-10-CM | POA: Insufficient documentation

## 2013-07-27 DIAGNOSIS — Z79899 Other long term (current) drug therapy: Secondary | ICD-10-CM | POA: Insufficient documentation

## 2013-07-27 DIAGNOSIS — Z5900 Homelessness unspecified: Secondary | ICD-10-CM | POA: Insufficient documentation

## 2013-07-27 DIAGNOSIS — IMO0001 Reserved for inherently not codable concepts without codable children: Secondary | ICD-10-CM | POA: Insufficient documentation

## 2013-07-27 DIAGNOSIS — B20 Human immunodeficiency virus [HIV] disease: Secondary | ICD-10-CM | POA: Insufficient documentation

## 2013-07-27 DIAGNOSIS — Z8679 Personal history of other diseases of the circulatory system: Secondary | ICD-10-CM | POA: Insufficient documentation

## 2013-07-27 DIAGNOSIS — G8929 Other chronic pain: Secondary | ICD-10-CM | POA: Insufficient documentation

## 2013-07-27 DIAGNOSIS — F17200 Nicotine dependence, unspecified, uncomplicated: Secondary | ICD-10-CM | POA: Insufficient documentation

## 2013-07-27 DIAGNOSIS — M2550 Pain in unspecified joint: Secondary | ICD-10-CM | POA: Insufficient documentation

## 2013-07-27 DIAGNOSIS — J45909 Unspecified asthma, uncomplicated: Secondary | ICD-10-CM | POA: Insufficient documentation

## 2013-07-27 MED ORDER — OXYCODONE-ACETAMINOPHEN 5-325 MG PO TABS
2.0000 | ORAL_TABLET | Freq: Once | ORAL | Status: AC
  Administered 2013-07-27: 2 via ORAL
  Filled 2013-07-27 (×2): qty 1

## 2013-07-27 NOTE — ED Notes (Signed)
Pt c/o of pain all in his joints. Not able to see dr until Monday at 0930 at infectious disease clinic and 1030 with hepatitis dr. Pt points out a reddened are to upper L arm.

## 2013-07-27 NOTE — ED Provider Notes (Signed)
CSN: [id number]     Arrival date & time 07/27/13  0119 History     First MD Initiated Contact with Patient 07/27/13 0209     Chief Complaint  Patient presents with  . Generalized Body Aches   (Consider location/radiation/quality/duration/timing/severity/associated sxs/prior Treatment) HPI Comments: 47 yo male presents to the ER complaining of diffuse body pain from his AIDs and Hepatitis C.  He reports he has f/u with his ID clinic at [facility] on Monday where he is expecting to have all of his medications refilled, along with a possible new medication regimen.  Pt states he has been dealing with homelessness and a recent move to [facility] and has yet to be established in the area to have his pain managed prior to Monday.  He denies any fever, chills, n/v/d. Pt has several other ED visits for similar complaints   Past Medical History  Diagnosis Date  . HIV (human immunodeficiency virus infection)   . Hepatitis C   . Asthma   . AIDS   . Cerebral hemorrhage    Past Surgical History  Procedure Laterality Date  . Hernia repair    . Mandible surgery    . Laporotomy     Family History  Problem Relation Age of Onset  . CAD Mother   . Stroke Mother    History  Substance Use Topics  . Smoking status: Current Every Day Smoker -- 2.00 packs/day for 38 years    Types: Cigarettes  . Smokeless tobacco: Never Used  . Alcohol Use: Yes     Comment: occasional    Review of Systems  Constitutional: Negative for fever and chills.  Musculoskeletal: Positive for myalgias and arthralgias.    Allergies  Aspirin  Home Medications   Current Outpatient Rx  Name  Route  Sig  Dispense  Refill  . albuterol (PROVENTIL HFA;VENTOLIN HFA) 108 (90 BASE) MCG/ACT inhaler   Inhalation   Inhale 2 puffs into the lungs every 2 (two) hours as needed for wheezing or shortness of breath (cough).   1 Inhaler   0   . cyclobenzaprine (FLEXERIL) 5 MG tablet   Oral   Take 5 mg by mouth 3 (three) times  daily as needed for muscle spasms (muscle spams).         . darunavir (PREZISTA) 400 MG tablet   Oral   Take 2 tablets (800 mg total) by mouth daily with breakfast.   30 tablet   0   . emtricitabine-tenofovir (TRUVADA) 200-300 MG per tablet   Oral   Take 1 tablet by mouth daily.         Marland Kitchen gabapentin (NEURONTIN) 100 MG capsule   Oral   Take 1 capsule (100 mg total) by mouth 3 (three) times daily.   10 capsule   0   . hydroxypropyl methylcellulose (ISOPTO TEARS) 2.5 % ophthalmic solution   Both Eyes   Place 1 drop into both eyes as needed (dry eyes).         . Multiple Vitamin (MULTIVITAMIN) tablet   Oral   Take 1 tablet by mouth daily.         Marland Kitchen oxyCODONE-acetaminophen (PERCOCET/ROXICET) 5-325 MG per tablet   Oral   Take 2 tablets by mouth every 6 (six) hours as needed for pain (pain).          . ritonavir (NORVIR) 100 MG capsule   Oral   Take 100 mg by mouth daily.         Marland Kitchen  sulfamethoxazole-trimethoprim (BACTRIM DS) 800-160 MG per tablet   Oral   Take 1 tablet by mouth 2 (two) times daily.         . temazepam (RESTORIL) 15 MG capsule   Oral   Take 1 capsule (15 mg total) by mouth at bedtime as needed for sleep.   3 capsule   0    BP 151/93  Pulse 59  Temp(Src) 98.4 F (36.9 C) (Oral)  Resp 20  SpO2 98% Physical Exam  Constitutional: He is oriented to person, place, and time. He appears well-developed and well-nourished. No distress.  HENT:  Head: Normocephalic and atraumatic.  Right Ear: External ear normal.  Left Ear: External ear normal.  Nose: Nose normal.  Eyes: Conjunctivae are normal.  Neck: Neck supple.  Cardiovascular: Normal rate, regular rhythm and normal heart sounds.   Pulmonary/Chest: Effort normal. No respiratory distress.  Abdominal: Soft. Bowel sounds are normal. There is no tenderness.  Musculoskeletal: Normal range of motion.  Neurological: He is alert and oriented to person, place, and time.  Skin: Skin is warm and  dry. He is not diaphoretic.  Psychiatric: He has a normal mood and affect.    ED Course   Procedures (including critical care time)  Labs Reviewed - No data to display No results found. 1. Chronic pain     MDM  46 yo male with chronic pain, requesting pain medication. Informed patient I was not going to write him prescription, but would give him 2 oxycodone tabs here. Pt to f/u with ID and or PCM for prescriptions. Patient is stable at time of discharge.    Jeannetta Ellis, PA-C 07/27/13 0404

## 2013-07-28 NOTE — ED Provider Notes (Signed)
  Medical screening examination/treatment/procedure(s) were performed by non-physician practitioner and as supervising physician I was immediately available for consultation/collaboration.    Gerhard Munch, MD 07/28/13 [phone]

## 2013-07-29 ENCOUNTER — Encounter (HOSPITAL_COMMUNITY): Payer: Self-pay | Admitting: *Deleted

## 2013-07-29 ENCOUNTER — Emergency Department (HOSPITAL_COMMUNITY)
Admission: EM | Admit: 2013-07-29 | Discharge: 2013-07-29 | Disposition: A | Discharge status: Home / Self Care | Payer: Self-pay | Attending: Emergency Medicine | Admitting: Emergency Medicine

## 2013-07-29 DIAGNOSIS — G8929 Other chronic pain: Secondary | ICD-10-CM | POA: Insufficient documentation

## 2013-07-29 DIAGNOSIS — F17200 Nicotine dependence, unspecified, uncomplicated: Secondary | ICD-10-CM | POA: Insufficient documentation

## 2013-07-29 DIAGNOSIS — Z8619 Personal history of other infectious and parasitic diseases: Secondary | ICD-10-CM | POA: Insufficient documentation

## 2013-07-29 DIAGNOSIS — Z8669 Personal history of other diseases of the nervous system and sense organs: Secondary | ICD-10-CM | POA: Insufficient documentation

## 2013-07-29 DIAGNOSIS — Z21 Asymptomatic human immunodeficiency virus [HIV] infection status: Secondary | ICD-10-CM | POA: Insufficient documentation

## 2013-07-29 DIAGNOSIS — J45909 Unspecified asthma, uncomplicated: Secondary | ICD-10-CM | POA: Insufficient documentation

## 2013-07-29 DIAGNOSIS — Z79899 Other long term (current) drug therapy: Secondary | ICD-10-CM | POA: Insufficient documentation

## 2013-07-29 NOTE — ED Notes (Signed)
Pt states he's here for pain management and a pain management referral. States he hurts "all over" 8/10.

## 2013-07-29 NOTE — ED Notes (Signed)
Pt ambulatory to exam room with steady gait.  

## 2013-07-29 NOTE — ED Provider Notes (Signed)
CSN: [id number]     Arrival date & time 07/29/13  2202 History  This chart was scribed for non-physician practitioner Ivonne Andrew, PA-C working with Geoffery Lyons, MD by Valera Castle, ED scribe. This patient was seen in room WTR6/WTR6 and the patient's care was started at 11:29 PM.    Chief Complaint  Patient presents with  . Pain management     The history is provided by the patient. No language interpreter was used.   HPI Comments: History provided by the patient and recent medical charts. Steven [name] is a 47 y.o. male who presents to the Emergency Department complaining of chronic generalized pain with a severity of 8/10. Patient has had 8 visits to the emergency department this month already for similar symptoms. 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 Pt states he saw his infectious disease doctor yesterday but states that he did not get a prescription for pain medications because he told his doctor he did not want one. Pt has been taking his other prescribed medication. He presents today requesting to have one dose of an immediate release pain medication for tonight as well as a referral for a chronic pain management specialist.  Patient does have a case manager, Neila Gear who he states is arranging help with living and other needs.   Past Medical History  Diagnosis Date  . HIV (human immunodeficiency virus infection)   . Hepatitis C   . Asthma   . AIDS   . Cerebral hemorrhage    Past Surgical History  Procedure Laterality Date  . Hernia repair    . Mandible surgery    . Laporotomy     Family History  Problem Relation Age of Onset  . CAD Mother   . Stroke Mother    History  Substance Use Topics  . Smoking status: Current Every Day Smoker -- 2.00 packs/day for 38 years    Types: Cigarettes  . Smokeless tobacco: Never Used  . Alcohol Use: Yes     Comment: occasional    Review of Systems  Musculoskeletal: Positive for myalgias.  All other systems  reviewed and are negative.    Allergies  Aspirin  Home Medications   Current Outpatient Rx  Name  Route  Sig  Dispense  Refill  . albuterol (PROVENTIL HFA;VENTOLIN HFA) 108 (90 BASE) MCG/ACT inhaler   Inhalation   Inhale 2 puffs into the lungs every 2 (two) hours as needed for wheezing or shortness of breath (cough).   1 Inhaler   0   . cyclobenzaprine (FLEXERIL) 10 MG tablet   Oral   Take 10 mg by mouth 3 (three) times daily as needed for muscle spasms.         . darunavir (PREZISTA) 400 MG tablet   Oral   Take 2 tablets (800 mg total) by mouth daily with breakfast.   30 tablet   0   . emtricitabine-tenofovir (TRUVADA) 200-300 MG per tablet   Oral   Take 1 tablet by mouth daily.         . hydroxypropyl methylcellulose (ISOPTO TEARS) 2.5 % ophthalmic solution   Both Eyes   Place 1 drop into both eyes as needed (dry eyes).         . Multiple Vitamin (MULTIVITAMIN WITH MINERALS) TABS tablet   Oral   Take 1 tablet by mouth daily.         Marland Kitchen oxyCODONE (OXY IR/ROXICODONE) 5 MG immediate release tablet  Oral   Take 5-10 mg by mouth every 4 (four) hours as needed for pain.         . ritonavir (NORVIR) 100 MG capsule   Oral   Take 100 mg by mouth daily.         Marland Kitchen sulfamethoxazole-trimethoprim (BACTRIM DS) 800-160 MG per tablet   Oral   Take 1 tablet by mouth 2 (two) times daily.         . temazepam (RESTORIL) 15 MG capsule   Oral   Take 1 capsule (15 mg total) by mouth at bedtime as needed for sleep.   3 capsule   0    BP 147/90  Pulse 83  Temp(Src) 98.4 F (36.9 C) (Oral)  Resp 20  SpO2 98%  Physical Exam  Nursing note and vitals reviewed. Constitutional: He is oriented to person, place, and time. He appears well-developed and well-nourished. No distress.  HENT:  Head: Normocephalic and atraumatic.  Eyes: EOM are normal.  Neck: Normal range of motion. Neck supple.  Cardiovascular: Normal rate, regular rhythm and normal heart sounds.    Pulmonary/Chest: Effort normal and breath sounds normal. No respiratory distress. He has no wheezes. He has no rales.  Abdominal: Soft. There is no tenderness.  Musculoskeletal: Normal range of motion. He exhibits no edema and no tenderness.  Neurological: He is alert and oriented to person, place, and time. He has normal strength. Gait normal.  Skin: Skin is warm and dry.  Psychiatric: He has a normal mood and affect. His behavior is normal.    ED Course  Procedures   DIAGNOSTIC STUDIES: Oxygen Saturation is 98% on room air, normal by my interpretation.    COORDINATION OF CARE: 11:41 PM-Discussed treatment plan which includes a referral to pain management with pt at bedside and pt agreed to plan.     Labs Review Labs Reviewed - No data to display Imaging Review No results found.  MDM   1. Chronic pain      Patient seen and evaluated. Patient sitting appears comfortable in no significant pain or discomfort. He expresses no pain during palpation of all his extremities or body. He has normal ambulation. He does not appear to have any emergent medical condition. He has had multiple recent visits to the emergency room where he has been given dose of pain medication. At this time I have informed him I will not give him any dose of pain medication but I will be happy to provide a referral for a pain management clinic. He is instructed to followup with her primary care provider or his infectious disease doctor or a pain management specialist for continued prescriptions of pain medication.     I personally performed the services described in this documentation, which was scribed in my presence. The recorded information has been reviewed and is accurate.     Angus Seller, PA-C 07/30/13 [phone]

## 2013-07-30 NOTE — ED Provider Notes (Signed)
Medical screening examination/treatment/procedure(s) were performed by non-physician practitioner and as supervising physician I was immediately available for consultation/collaboration.  Lind Ausley, MD 07/30/13 0619 

## 2013-08-01 ENCOUNTER — Emergency Department (HOSPITAL_COMMUNITY)
Admission: EM | Admit: 2013-08-01 | Discharge: 2013-08-01 | Disposition: A | Discharge status: Home / Self Care | Payer: Self-pay | Attending: Emergency Medicine | Admitting: Emergency Medicine

## 2013-08-01 ENCOUNTER — Emergency Department (HOSPITAL_COMMUNITY): Payer: Self-pay

## 2013-08-01 ENCOUNTER — Encounter (HOSPITAL_COMMUNITY): Payer: Self-pay | Admitting: Emergency Medicine

## 2013-08-01 DIAGNOSIS — B1920 Unspecified viral hepatitis C without hepatic coma: Secondary | ICD-10-CM

## 2013-08-01 DIAGNOSIS — Z8619 Personal history of other infectious and parasitic diseases: Secondary | ICD-10-CM | POA: Insufficient documentation

## 2013-08-01 DIAGNOSIS — Z8679 Personal history of other diseases of the circulatory system: Secondary | ICD-10-CM | POA: Insufficient documentation

## 2013-08-01 DIAGNOSIS — R112 Nausea with vomiting, unspecified: Secondary | ICD-10-CM | POA: Insufficient documentation

## 2013-08-01 DIAGNOSIS — B182 Chronic viral hepatitis C: Secondary | ICD-10-CM | POA: Insufficient documentation

## 2013-08-01 DIAGNOSIS — J45909 Unspecified asthma, uncomplicated: Secondary | ICD-10-CM | POA: Insufficient documentation

## 2013-08-01 DIAGNOSIS — F17200 Nicotine dependence, unspecified, uncomplicated: Secondary | ICD-10-CM | POA: Insufficient documentation

## 2013-08-01 DIAGNOSIS — B20 Human immunodeficiency virus [HIV] disease: Secondary | ICD-10-CM | POA: Insufficient documentation

## 2013-08-01 DIAGNOSIS — Z79899 Other long term (current) drug therapy: Secondary | ICD-10-CM | POA: Insufficient documentation

## 2013-08-01 LAB — CBC WITH DIFFERENTIAL/PLATELET
Basophils Absolute: 0 10*3/uL (ref 0.0–0.1)
Eosinophils Relative: 3 % (ref 0–5)
Lymphocytes Relative: 36 % (ref 12–46)
Lymphs Abs: 1.2 10*3/uL (ref 0.7–4.0)
MCV: 97.9 fL (ref 78.0–100.0)
Monocytes Relative: 16 % — ABNORMAL HIGH (ref 3–12)
Neutrophils Relative %: 45 % (ref 43–77)
Platelets: 114 10*3/uL — ABNORMAL LOW (ref 150–400)
RBC: 3.28 MIL/uL — ABNORMAL LOW (ref 4.22–5.81)
RDW: 13 % (ref 11.5–15.5)
WBC: 3.4 10*3/uL — ABNORMAL LOW (ref 4.0–10.5)

## 2013-08-01 LAB — URINALYSIS, ROUTINE W REFLEX MICROSCOPIC
Bilirubin Urine: NEGATIVE
Glucose, UA: NEGATIVE mg/dL
Hgb urine dipstick: NEGATIVE
Protein, ur: NEGATIVE mg/dL
Specific Gravity, Urine: 1.023 (ref 1.005–1.030)

## 2013-08-01 LAB — COMPREHENSIVE METABOLIC PANEL
ALT: 111 U/L — ABNORMAL HIGH (ref 0–53)
AST: 159 U/L — ABNORMAL HIGH (ref 0–37)
Albumin: 3.1 g/dL — ABNORMAL LOW (ref 3.5–5.2)
Chloride: 95 mEq/L — ABNORMAL LOW (ref 96–112)
Creatinine, Ser: 0.87 mg/dL (ref 0.50–1.35)
Sodium: 133 mEq/L — ABNORMAL LOW (ref 135–145)
Total Bilirubin: 1.9 mg/dL — ABNORMAL HIGH (ref 0.3–1.2)

## 2013-08-01 MED ORDER — SODIUM CHLORIDE 0.9 % IV BOLUS (SEPSIS)
1000.0000 mL | Freq: Once | INTRAVENOUS | Status: AC
  Administered 2013-08-01: 1000 mL via INTRAVENOUS

## 2013-08-01 MED ORDER — PROMETHAZINE HCL 25 MG PO TABS: 25.0000 mg | ORAL_TABLET | Freq: Four times a day (QID) | ORAL | Status: DC | PRN

## 2013-08-01 MED ORDER — MORPHINE SULFATE 4 MG/ML IJ SOLN
4.0000 mg | Freq: Once | INTRAMUSCULAR | Status: AC
  Administered 2013-08-01: 4 mg via INTRAVENOUS
  Filled 2013-08-01: qty 1

## 2013-08-01 MED ORDER — ONDANSETRON HCL 4 MG/2ML IJ SOLN
4.0000 mg | Freq: Once | INTRAMUSCULAR | Status: AC
  Administered 2013-08-01: 4 mg via INTRAVENOUS
  Filled 2013-08-01: qty 2

## 2013-08-01 MED ORDER — FAMOTIDINE IN NACL 20-0.9 MG/50ML-% IV SOLN
20.0000 mg | Freq: Once | INTRAVENOUS | Status: AC
  Administered 2013-08-01: 20 mg via INTRAVENOUS
  Filled 2013-08-01: qty 50

## 2013-08-01 NOTE — ED Notes (Addendum)
Pt presents with c/o of dehydration and emesis. 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 Pt reports being out of phenergan and has been vomiting and feels dehydrated. Pt reports that due to charge in medication his viral load is undetectable, however his CD4 hasn't changed. Pt is states he is concerned about "wasting syndrome" due to weight drop from 227 lbs over the past two months, weight in triage is 175.8 lbs. Pt is A/Ox 4 and in NAD.

## 2013-08-01 NOTE — ED Provider Notes (Signed)
CSN: [id number]     Arrival date & time 08/01/13  1836 History   First MD Initiated Contact with Patient 08/01/13 1948     Chief Complaint  Patient presents with  . Dehydration  . Nausea   (Consider location/radiation/quality/duration/timing/severity/associated sxs/prior Treatment) HPI  Steven [name] is a 47 y.o. male with past medical history significant for hep C, HIV-AIDS (last CD4 count was under 100 as per patient) he is followed by infectious disease in [place]. He presents to the emergency room today with multiple episodes of nonbloody, nonbilious, no coffee ground emesis starting last night. Patient denies fever, abdominal pain, diarrhea, sick contacts, chest pain, shortness of breath.    Past Medical History  Diagnosis Date  . HIV (human immunodeficiency virus infection)   . Hepatitis C   . Asthma   . AIDS   . Cerebral hemorrhage    Past Surgical History  Procedure Laterality Date  . Hernia repair    . Mandible surgery    . Laporotomy     Family History  Problem Relation Age of Onset  . CAD Mother   . Stroke Mother    History  Substance Use Topics  . Smoking status: Current Every Day Smoker -- 2.00 packs/day for 38 years    Types: Cigarettes  . Smokeless tobacco: Never Used  . Alcohol Use: Yes     Comment: occasional    Review of Systems 10 systems reviewed and found to be negative, except as noted in the HPI  Allergies  Aspirin  Home Medications   Current Outpatient Rx  Name  Route  Sig  Dispense  Refill  . albuterol (PROVENTIL HFA;VENTOLIN HFA) 108 (90 BASE) MCG/ACT inhaler   Inhalation   Inhale 2 puffs into the lungs every 2 (two) hours as needed for wheezing or shortness of breath (cough).   1 Inhaler   0   . cyclobenzaprine (FLEXERIL) 10 MG tablet   Oral   Take 10 mg by mouth 3 (three) times daily as needed for muscle spasms.         . darunavir (PREZISTA) 400 MG tablet   Oral   Take 2 tablets (800 mg total) by mouth daily with  breakfast.   30 tablet   0   . emtricitabine-tenofovir (TRUVADA) 200-300 MG per tablet   Oral   Take 1 tablet by mouth daily.         Marland Kitchen ibuprofen (ADVIL,MOTRIN) 200 MG tablet   Oral   Take 800 mg by mouth every 6 (six) hours as needed for pain.         . Multiple Vitamin (MULTIVITAMIN WITH MINERALS) TABS tablet   Oral   Take 1 tablet by mouth daily.         Marland Kitchen oxyCODONE (OXY IR/ROXICODONE) 5 MG immediate release tablet   Oral   Take 5-10 mg by mouth every 4 (four) hours as needed for pain.         . ritonavir (NORVIR) 100 MG capsule   Oral   Take 100 mg by mouth daily.         Marland Kitchen sulfamethoxazole-trimethoprim (BACTRIM DS) 800-160 MG per tablet   Oral   Take 1 tablet by mouth 2 (two) times daily.         . temazepam (RESTORIL) 15 MG capsule   Oral   Take 1 capsule (15 mg total) by mouth at bedtime as needed for sleep.   3 capsule   0   . hydroxypropyl  methylcellulose (ISOPTO TEARS) 2.5 % ophthalmic solution   Both Eyes   Place 1 drop into both eyes as needed (dry eyes).          BP 138/80  Pulse 102  Temp(Src) 99.1 F (37.3 C) (Oral)  Resp 16  SpO2 100% Physical Exam  Nursing note and vitals reviewed. Constitutional: He is oriented to person, place, and time. He appears well-developed and well-nourished. No distress.  HENT:  Head: Normocephalic.  Mouth/Throat: Oropharynx is clear and moist.  Eyes: Conjunctivae and EOM are normal. Pupils are equal, round, and reactive to light.  Cardiovascular: Normal rate, regular rhythm and intact distal pulses.   Pulmonary/Chest: Effort normal and breath sounds normal. No stridor. No respiratory distress. He has no wheezes. He has no rales. He exhibits no tenderness.  Abdominal: Soft. Bowel sounds are normal. He exhibits no distension and no mass. There is tenderness. There is no rebound and no guarding.  Midline well-healed surgical scar with umbilical hernia, reducible. Patient has mild tenderness to palpation of  the right upper quadrant with no guarding or rebound.  Musculoskeletal: Normal range of motion. He exhibits no edema.  Neurological: He is alert and oriented to person, place, and time.  Psychiatric: He has a normal mood and affect.    ED Course  Procedures (including critical care time) Labs Review Labs Reviewed  CBC WITH DIFFERENTIAL - Abnormal; Notable for the following:    WBC 3.4 (*)    RBC 3.28 (*)    Hemoglobin 11.1 (*)    HCT 32.1 (*)    Platelets 114 (*)    Monocytes Relative 16 (*)    Neutro Abs 1.6 (*)    All other components within normal limits  COMPREHENSIVE METABOLIC PANEL - Abnormal; Notable for the following:    Sodium 133 (*)    Chloride 95 (*)    BUN 27 (*)    Albumin 3.1 (*)    AST 159 (*)    ALT 111 (*)    Total Bilirubin 1.9 (*)    All other components within normal limits  URINALYSIS, ROUTINE W REFLEX MICROSCOPIC   Imaging Review US Abdomen Limited Ruq  08/01/2013   CLINICAL DATA:  Right upper quadrant pain.  EXAM: US ABDOMEN LIMITED - RIGHT UPPER QUADRANT  COMPARISON:  None.  FINDINGS: Gallbladder: 5 mm gallstone layering dependently in the gallbladder. No wall thickening. Negative sonographic Murphy's sign.  Common bile duct: Normal caliber, 4 mm.  Liver: No focal lesion identified. Within normal limits in parenchymal echogenicity.  IMPRESSION: Single tiny mobile layering gallstone. No evidence of cholecystitis.   Electronically Signed   By: Charlett Nose   On: 08/01/2013 21:26    MDM   1. Nausea and vomiting in adult   2. AIDS   3. Hepatitis C    Filed Vitals:   08/01/13 1910 08/01/13 2343  BP: 138/80 124/80  Pulse: 102 67  Temp: 99.1 F (37.3 C) 97.9 F (36.6 C)  TempSrc: Oral Oral  Resp: 16 18  SpO2: 100% 100%     Steven [name] is a 47 y.o. male with past medical history significant for HIV AIDS and hepatitis C complaining of multiple episodes of nonbloody, nonbilious, no coffee ground emesis. Patient reports a crampy upper abdominal  pain. Abdominal exam is nonsurgical. However he does have mild tenderness to palpation of right upper quadrant. IV fluids, antiemetics and ultrasound ordered.  Patient has a pancytopenia typical for his baseline, chemistries also in line with his  baseline. Ultrasound shows no signs of cholecystitis. Patient is tolerating by mouth.   Medications  sodium chloride 0.9 % bolus 1,000 mL (0 mLs Intravenous Stopped 08/01/13 2136)  ondansetron (ZOFRAN) injection 4 mg (4 mg Intravenous Given 08/01/13 2021)  morphine 4 MG/ML injection 4 mg (4 mg Intravenous Given 08/01/13 2025)  famotidine (PEPCID) IVPB 20 mg (0 mg Intravenous Stopped 08/01/13 2136)  sodium chloride 0.9 % bolus 1,000 mL (0 mLs Intravenous Stopped 08/01/13 2338)  ondansetron (ZOFRAN) injection 4 mg (4 mg Intravenous Given 08/01/13 2313)    Pt is hemodynamically stable, appropriate for, and amenable to discharge at this time. Pt verbalized understanding and agrees with care plan. All questions answered. Outpatient follow-up and specific return precautions discussed.    Discharge Medication List as of 08/01/2013 11:27 PM    START taking these medications   Details  promethazine (PHENERGAN) 25 MG tablet Take 1 tablet (25 mg total) by mouth every 6 (six) hours as needed for nausea., Starting 08/01/2013, Until Discontinued, Print        Note: Portions of this report may have been transcribed using voice recognition software. Every effort was made to ensure accuracy; however, inadvertent computerized transcription errors may be present      Wynetta Emery, PA-C 08/02/13 0003

## 2013-08-02 ENCOUNTER — Emergency Department (HOSPITAL_COMMUNITY)
Admission: EM | Admit: 2013-08-02 | Discharge: 2013-08-03 | Discharge status: Against Medical Advice | Payer: Self-pay | Attending: Emergency Medicine | Admitting: Emergency Medicine

## 2013-08-02 ENCOUNTER — Encounter (HOSPITAL_COMMUNITY): Payer: Self-pay | Admitting: Emergency Medicine

## 2013-08-02 DIAGNOSIS — J45909 Unspecified asthma, uncomplicated: Secondary | ICD-10-CM | POA: Insufficient documentation

## 2013-08-02 DIAGNOSIS — Z8619 Personal history of other infectious and parasitic diseases: Secondary | ICD-10-CM | POA: Insufficient documentation

## 2013-08-02 DIAGNOSIS — B20 Human immunodeficiency virus [HIV] disease: Secondary | ICD-10-CM | POA: Insufficient documentation

## 2013-08-02 DIAGNOSIS — Z8679 Personal history of other diseases of the circulatory system: Secondary | ICD-10-CM | POA: Insufficient documentation

## 2013-08-02 DIAGNOSIS — G8929 Other chronic pain: Secondary | ICD-10-CM | POA: Insufficient documentation

## 2013-08-02 DIAGNOSIS — Z79899 Other long term (current) drug therapy: Secondary | ICD-10-CM | POA: Insufficient documentation

## 2013-08-02 DIAGNOSIS — F17200 Nicotine dependence, unspecified, uncomplicated: Secondary | ICD-10-CM | POA: Insufficient documentation

## 2013-08-02 DIAGNOSIS — IMO0001 Reserved for inherently not codable concepts without codable children: Secondary | ICD-10-CM | POA: Insufficient documentation

## 2013-08-02 NOTE — ED Notes (Signed)
Pt states he has been having vomiting and is unable to hold down anything  Pt states he feels dehydrated  Pt states his mouth is very dry  Pt was seen here last night for same and received IVF, pain meds, and nausea medications  Pt states he is hurting all over  Pt states he had an ultrasound last night for the pain and states it showed a small stone in his gallbladder

## 2013-08-02 NOTE — ED Provider Notes (Signed)
Medical screening examination/treatment/procedure(s) were performed by non-physician practitioner and as supervising physician I was immediately available for consultation/collaboration.   Dagmar Hait, MD 08/02/13 [phone]

## 2013-08-03 MED ORDER — ONDANSETRON 8 MG PO TBDP
8.0000 mg | ORAL_TABLET | Freq: Once | ORAL | Status: AC
  Administered 2013-08-03: 8 mg via ORAL
  Filled 2013-08-03: qty 1

## 2013-08-03 NOTE — ED Provider Notes (Signed)
CSN: [id number]     Arrival date & time 08/02/13  2310 History   First MD Initiated Contact with Patient 08/03/13 0056     Chief Complaint  Patient presents with  . Emesis   HPI  History provided by the patient and recent medical chart. The patient is a 47 year old male with history of HIV, hepatitis C and chronic pain who has had recent multiple visits to the emergency room due to chronic pain. Tonight presents complaining that he has dehydration with nausea vomiting. He was seen less than 24 hours ago with similar symptoms. He had unremarkable lab testings. He was given IV narcotics during that emergency room visit and today is requesting for the same treatment of IV narcotics and fluids. He reports vomiting every time he tries to eat or drink. He did not fill the prescription he was given for Phenergan. He does report that he tried to followup with the pain management clinic but was told he needed to wait 2-3 days for his referral. He denies any associated fever, chills or sweats. Denies any diarrhea. No new injuries or trauma.     Past Medical History  Diagnosis Date  . HIV (human immunodeficiency virus infection)   . Hepatitis C   . Asthma   . AIDS   . Cerebral hemorrhage    Past Surgical History  Procedure Laterality Date  . Hernia repair    . Mandible surgery    . Laporotomy     Family History  Problem Relation Age of Onset  . CAD Mother   . Stroke Mother    History  Substance Use Topics  . Smoking status: Current Every Day Smoker -- 2.00 packs/day for 38 years    Types: Cigarettes  . Smokeless tobacco: Never Used  . Alcohol Use: Yes     Comment: occasional    Review of Systems  Constitutional: Negative for fever, chills and diaphoresis.  Gastrointestinal: Positive for nausea and vomiting. Negative for diarrhea and constipation.  Musculoskeletal: Positive for myalgias.  All other systems reviewed and are negative.    Allergies  Aspirin  Home Medications    Current Outpatient Rx  Name  Route  Sig  Dispense  Refill  . albuterol (PROVENTIL HFA;VENTOLIN HFA) 108 (90 BASE) MCG/ACT inhaler   Inhalation   Inhale 2 puffs into the lungs every 2 (two) hours as needed for wheezing or shortness of breath (cough).   1 Inhaler   0   . cyclobenzaprine (FLEXERIL) 10 MG tablet   Oral   Take 10 mg by mouth 3 (three) times daily as needed for muscle spasms.         . darunavir (PREZISTA) 400 MG tablet   Oral   Take 2 tablets (800 mg total) by mouth daily with breakfast.   30 tablet   0   . emtricitabine-tenofovir (TRUVADA) 200-300 MG per tablet   Oral   Take 1 tablet by mouth every morning.          . hydroxypropyl methylcellulose (ISOPTO TEARS) 2.5 % ophthalmic solution   Both Eyes   Place 1 drop into both eyes as needed (dry eyes).         Marland Kitchen ibuprofen (ADVIL,MOTRIN) 200 MG tablet   Oral   Take 800 mg by mouth every 6 (six) hours as needed for pain.         . Multiple Vitamin (MULTIVITAMIN WITH MINERALS) TABS tablet   Oral   Take 1 tablet by mouth every  morning.          Marland Kitchen oxyCODONE (OXY IR/ROXICODONE) 5 MG immediate release tablet   Oral   Take 5-10 mg by mouth every 4 (four) hours as needed for pain.         . ritonavir (NORVIR) 100 MG capsule   Oral   Take 100 mg by mouth every morning.          . sulfamethoxazole-trimethoprim (BACTRIM DS) 800-160 MG per tablet   Oral   Take 1 tablet by mouth 2 (two) times daily.         . temazepam (RESTORIL) 15 MG capsule   Oral   Take 1 capsule (15 mg total) by mouth at bedtime as needed for sleep.   3 capsule   0   . promethazine (PHENERGAN) 25 MG tablet   Oral   Take 1 tablet (25 mg total) by mouth every 6 (six) hours as needed for nausea.   12 tablet   0    BP 110/77  Pulse 94  Temp(Src) 98.8 F (37.1 C) (Oral)  Resp 20  SpO2 100% Physical Exam  Nursing note and vitals reviewed. Constitutional: He is oriented to person, place, and time. He appears  well-developed and well-nourished. No distress.  HENT:  Head: Normocephalic and atraumatic.  Mouth/Throat: Oropharynx is clear and moist.  Cardiovascular: Normal rate and regular rhythm.   Pulmonary/Chest: Effort normal and breath sounds normal. No respiratory distress. He has no wheezes. He has no rales.  Abdominal: Soft. He exhibits no distension. There is no tenderness. There is no rebound and no guarding.  Musculoskeletal: Normal range of motion. He exhibits no edema and no tenderness.  Patient does not exhibit any tenderness to palpation of extremities. No deformities or swelling.  Neurological: He is alert and oriented to person, place, and time.  Skin: Skin is warm.  Psychiatric: He has a normal mood and affect. His behavior is normal.    ED Course  Procedures     Imaging Review US Abdomen Limited Ruq  08/01/2013   CLINICAL DATA:  Right upper quadrant pain.  EXAM: US ABDOMEN LIMITED - RIGHT UPPER QUADRANT  COMPARISON:  None.  FINDINGS: Gallbladder: 5 mm gallstone layering dependently in the gallbladder. No wall thickening. Negative sonographic Murphy's sign.  Common bile duct: Normal caliber, 4 mm.  Liver: No focal lesion identified. Within normal limits in parenchymal echogenicity.  IMPRESSION: Single tiny mobile layering gallstone. No evidence of cholecystitis.   Electronically Signed   By: Charlett Nose   On: 08/01/2013 21:26    MDM   1. Chronic pain      Patient seen and evaluated. Patient resting in bed appears comfortable in no acute distress. He does not appear in any significant discomfort or pains. He does not show any discomfort during palpation of extremities or abdomen. Plan to give Zofran ODT and fluid challenge. If he tolerates without active emesis he'll be discharged.  Patient was tolerating fluids. Nurse informed me that patient continued to ask for a different provider and he told the patient there would be no one else treating him besides me and patient  decided to leave AMA prior to testing. He had no active vomiting after drinking water. Patient is a chronic pain patient. He appeared somewhat intoxicated here but denied any narcotic use. His heart rate was normal. He continued to appear comfortable throughout the whole stay without any signs of pain. Ambulating normally. I doubt any serious withdrawal symptoms. Patient has  been given outside resources for pain management.   Angus Seller, PA-C 08/03/13 [phone]

## 2013-08-03 NOTE — ED Notes (Signed)
Patient was informed that he needed to stay to complete his work up.  He was informed that he would not receive any pain medication until labs were completed. Patient refused labs and left.  He did not show any signs of distress upon leaving

## 2013-08-03 NOTE — ED Provider Notes (Signed)
Medical screening examination/treatment/procedure(s) were performed by non-physician practitioner and as supervising physician I was immediately available for consultation/collaboration.  Maezie Justin M Irvin Lizama, MD 08/03/13 0640 

## 2013-08-09 ENCOUNTER — Encounter (HOSPITAL_COMMUNITY): Payer: Self-pay | Admitting: Emergency Medicine

## 2013-08-09 ENCOUNTER — Emergency Department (HOSPITAL_COMMUNITY)
Admission: EM | Admit: 2013-08-09 | Discharge: 2013-08-09 | Disposition: A | Discharge status: Home / Self Care | Payer: MEDICAID | Attending: Emergency Medicine | Admitting: Emergency Medicine

## 2013-08-09 DIAGNOSIS — Z79899 Other long term (current) drug therapy: Secondary | ICD-10-CM | POA: Insufficient documentation

## 2013-08-09 DIAGNOSIS — J45909 Unspecified asthma, uncomplicated: Secondary | ICD-10-CM | POA: Insufficient documentation

## 2013-08-09 DIAGNOSIS — Z8669 Personal history of other diseases of the nervous system and sense organs: Secondary | ICD-10-CM | POA: Insufficient documentation

## 2013-08-09 DIAGNOSIS — Z8619 Personal history of other infectious and parasitic diseases: Secondary | ICD-10-CM | POA: Insufficient documentation

## 2013-08-09 DIAGNOSIS — B20 Human immunodeficiency virus [HIV] disease: Secondary | ICD-10-CM | POA: Insufficient documentation

## 2013-08-09 DIAGNOSIS — F17200 Nicotine dependence, unspecified, uncomplicated: Secondary | ICD-10-CM | POA: Insufficient documentation

## 2013-08-09 DIAGNOSIS — G8929 Other chronic pain: Secondary | ICD-10-CM | POA: Insufficient documentation

## 2013-08-09 MED ORDER — KETOROLAC TROMETHAMINE 60 MG/2ML IM SOLN
60.0000 mg | Freq: Once | INTRAMUSCULAR | Status: AC
  Administered 2013-08-09: 60 mg via INTRAMUSCULAR
  Filled 2013-08-09: qty 2
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
## 2013-08-09 NOTE — ED Notes (Signed)
Pt is requesting Oxycodone 5/325mg  x 2. Pt does admit to drinking 2 12oz beers tonight.

## 2013-08-09 NOTE — ED Provider Notes (Signed)
CSN: [id number]     Arrival date & time 08/09/13  0025 History   First MD Initiated Contact with Patient 08/09/13 0030     Chief Complaint  Patient presents with  . generalized pain    (Consider location/radiation/quality/duration/timing/severity/associated sxs/prior Treatment) HPI History provided by pt and prior chart.  Per prior chart, pt has AIDS, Hep C and chronic pain.  Has been seen in the ED 11 times in the last month for various reasons, but the majority of visits have been for generalized aches and pains.  Left AMA the last two times.  He presents today with same complaint.    Has typical, severe, diffuse, non-traumatic arthralgias that he attributes to AIDs.  Some relief w/ ibuprofen.  His ID physician is located in [place] and he will not prescribe him pain medication.  He has an appointment scheduled with a new PCP on 08/18/13.  He is requesting percocet for pain to get him through the night.  He denies fever and is otherwise feeling well.   Past Medical History  Diagnosis Date  . HIV (human immunodeficiency virus infection)   . Hepatitis C   . Asthma   . AIDS   . Cerebral hemorrhage    Past Surgical History  Procedure Laterality Date  . Hernia repair    . Mandible surgery    . Laporotomy     Family History  Problem Relation Age of Onset  . CAD Mother   . Stroke Mother    History  Substance Use Topics  . Smoking status: Current Every Day Smoker -- 2.00 packs/day for 38 years    Types: Cigarettes  . Smokeless tobacco: Never Used  . Alcohol Use: Yes     Comment: occasional    Review of Systems  All other systems reviewed and are negative.    Allergies  Aspirin  Home Medications   Current Outpatient Rx  Name  Route  Sig  Dispense  Refill  . albuterol (PROVENTIL HFA;VENTOLIN HFA) 108 (90 BASE) MCG/ACT inhaler   Inhalation   Inhale 2 puffs into the lungs every 2 (two) hours as needed for wheezing or shortness of breath (cough).   1 Inhaler   0   .  cyclobenzaprine (FLEXERIL) 10 MG tablet   Oral   Take 10 mg by mouth 3 (three) times daily as needed for muscle spasms.         . darunavir (PREZISTA) 400 MG tablet   Oral   Take 2 tablets (800 mg total) by mouth daily with breakfast.   30 tablet   0   . emtricitabine-tenofovir (TRUVADA) 200-300 MG per tablet   Oral   Take 1 tablet by mouth every morning.          . hydroxypropyl methylcellulose (ISOPTO TEARS) 2.5 % ophthalmic solution   Both Eyes   Place 1 drop into both eyes as needed (dry eyes).         Marland Kitchen ibuprofen (ADVIL,MOTRIN) 200 MG tablet   Oral   Take 800 mg by mouth every 6 (six) hours as needed for pain.         . Multiple Vitamin (MULTIVITAMIN WITH MINERALS) TABS tablet   Oral   Take 1 tablet by mouth every morning.          Marland Kitchen oxyCODONE (OXY IR/ROXICODONE) 5 MG immediate release tablet   Oral   Take 5-10 mg by mouth every 4 (four) hours as needed for pain.         Marland Kitchen  promethazine (PHENERGAN) 25 MG tablet   Oral   Take 1 tablet (25 mg total) by mouth every 6 (six) hours as needed for nausea.   12 tablet   0   . ritonavir (NORVIR) 100 MG capsule   Oral   Take 100 mg by mouth every morning.          . sulfamethoxazole-trimethoprim (BACTRIM DS) 800-160 MG per tablet   Oral   Take 1 tablet by mouth 2 (two) times daily.         . temazepam (RESTORIL) 15 MG capsule   Oral   Take 1 capsule (15 mg total) by mouth at bedtime as needed for sleep.   3 capsule   0    BP 131/77  Pulse 78  Temp(Src) 99 F (37.2 C) (Oral)  Resp 18  SpO2 100% Physical Exam  Nursing note and vitals reviewed. Constitutional: He is oriented to person, place, and time. He appears well-developed and well-nourished. No distress.  Pt sleeping upon entering room  HENT:  Head: Normocephalic and atraumatic.  Eyes:  Normal appearance  Neck: Normal range of motion.  Cardiovascular: Normal rate.   Pulmonary/Chest: Effort normal.  Musculoskeletal: Normal range of  motion.  Neurological: He is alert and oriented to person, place, and time.  Pt appears mildly intoxicated.    Psychiatric: He has a normal mood and affect. His behavior is normal.    ED Course  Procedures (including critical care time) Labs Review Labs Reviewed - No data to display Imaging Review No results found.  MDM   1. Chronic pain    47yo M w/ AIDs and Hep C presents w/ typical, non-traumatic, generalized arthralgias.  This is his twelfth visit in one month and several of those visits have been for chronic pain.  He is requesting a dose of pain medication to get him through the night.  He is afebrile, non-toxic appearing and in NAD.  Offered him 60mg  IM toradol and recommended that he continue ibuprofen at home.  He has appt scheduled w/ new PCP on 08/18/13.  3:15 AM    Otilio Miu, PA-C 08/09/13 [phone]

## 2013-08-09 NOTE — ED Notes (Signed)
Pt c/o generalized pain, pt states he has an appointment with a PCP 08-18-13. Pt states he needs pain medication.

## 2013-08-09 NOTE — ED Provider Notes (Signed)
Medical screening examination/treatment/procedure(s) were performed by non-physician practitioner and as supervising physician I was immediately available for consultation/collaboration.   Shanna Cisco, MD 08/09/13 [phone]

## 2013-08-11 DIAGNOSIS — R42 Dizziness and giddiness: Secondary | ICD-10-CM | POA: Insufficient documentation

## 2013-08-11 DIAGNOSIS — Z79899 Other long term (current) drug therapy: Secondary | ICD-10-CM | POA: Insufficient documentation

## 2013-08-11 DIAGNOSIS — J45909 Unspecified asthma, uncomplicated: Secondary | ICD-10-CM | POA: Insufficient documentation

## 2013-08-11 DIAGNOSIS — G8929 Other chronic pain: Secondary | ICD-10-CM | POA: Insufficient documentation

## 2013-08-11 DIAGNOSIS — F17200 Nicotine dependence, unspecified, uncomplicated: Secondary | ICD-10-CM | POA: Insufficient documentation

## 2013-08-11 DIAGNOSIS — Z888 Allergy status to other drugs, medicaments and biological substances status: Secondary | ICD-10-CM | POA: Insufficient documentation

## 2013-08-11 DIAGNOSIS — Z765 Malingerer [conscious simulation]: Secondary | ICD-10-CM | POA: Insufficient documentation

## 2013-08-11 DIAGNOSIS — Z21 Asymptomatic human immunodeficiency virus [HIV] infection status: Secondary | ICD-10-CM | POA: Insufficient documentation

## 2013-08-11 DIAGNOSIS — B1920 Unspecified viral hepatitis C without hepatic coma: Secondary | ICD-10-CM | POA: Insufficient documentation

## 2013-08-12 ENCOUNTER — Emergency Department (HOSPITAL_COMMUNITY)
Admission: EM | Admit: 2013-08-12 | Discharge: 2013-08-12 | Disposition: A | Discharge status: Home / Self Care | Payer: MEDICAID | Attending: Emergency Medicine | Admitting: Emergency Medicine

## 2013-08-12 ENCOUNTER — Encounter (HOSPITAL_COMMUNITY): Payer: Self-pay | Admitting: Emergency Medicine

## 2013-08-12 DIAGNOSIS — Z765 Malingerer [conscious simulation]: Secondary | ICD-10-CM

## 2013-08-12 MED ORDER — KETOROLAC TROMETHAMINE 30 MG/ML IJ SOLN
30.0000 mg | Freq: Once | INTRAMUSCULAR | Status: AC
  Administered 2013-08-12: 30 mg via INTRAMUSCULAR
  Filled 2013-08-12: qty 1

## 2013-08-12 NOTE — ED Provider Notes (Signed)
Medical screening examination/treatment/procedure(s) were performed by non-physician practitioner and as supervising physician I was immediately available for consultation/collaboration.  Clif Serio, MD 08/12/13 0635 

## 2013-08-12 NOTE — ED Notes (Signed)
Pt. reports chronic body aches for several days denies injury or fall , ambulatory , respirations unlabored , alert and oriented .

## 2013-08-12 NOTE — ED Provider Notes (Signed)
CSN: [id number]     Arrival date & time 08/11/13  2353 History   First MD Initiated Contact with Patient 08/12/13 0031     Chief Complaint  Patient presents with  . Generalized Body Aches   (Consider location/radiation/quality/duration/timing/severity/associated sxs/prior Treatment) HPI Comments: Patient presents tonight with exacerbation of his chronic.  Overall, generalized body aches.  He presents frequently to the emergency department, with the same.  He has not taken any over-the-counter medication.  States he will take a bus tomorrow to see his ID doctor in [place]. On entering the room.  The patient was found soundly  sleeping in no apparent distress  The history is provided by the patient.    Past Medical History  Diagnosis Date  . HIV (human immunodeficiency virus infection)   . Hepatitis C   . Asthma   . AIDS   . Cerebral hemorrhage    Past Surgical History  Procedure Laterality Date  . Hernia repair    . Mandible surgery    . Laporotomy     Family History  Problem Relation Age of Onset  . CAD Mother   . Stroke Mother    History  Substance Use Topics  . Smoking status: Current Every Day Smoker -- 2.00 packs/day for 38 years    Types: Cigarettes  . Smokeless tobacco: Never Used  . Alcohol Use: Yes     Comment: occasional    Review of Systems  Constitutional: Negative for fever and chills.  Respiratory: Negative for cough and shortness of breath.   Cardiovascular: Negative for chest pain.  Gastrointestinal: Negative for vomiting, diarrhea, constipation and rectal pain.  Genitourinary: Negative for dysuria.  Musculoskeletal: Positive for myalgias.  Skin: Negative for wound.  Neurological: Positive for dizziness. Negative for headaches.  All other systems reviewed and are negative.    Allergies  Aspirin  Home Medications   Current Outpatient Rx  Name  Route  Sig  Dispense  Refill  . albuterol (PROVENTIL HFA;VENTOLIN HFA) 108 (90 BASE) MCG/ACT  inhaler   Inhalation   Inhale 2 puffs into the lungs every 2 (two) hours as needed for wheezing or shortness of breath (cough).   1 Inhaler   0   . cyclobenzaprine (FLEXERIL) 10 MG tablet   Oral   Take 10 mg by mouth 3 (three) times daily as needed for muscle spasms.         . darunavir (PREZISTA) 400 MG tablet   Oral   Take 2 tablets (800 mg total) by mouth daily with breakfast.   30 tablet   0   . emtricitabine-tenofovir (TRUVADA) 200-300 MG per tablet   Oral   Take 1 tablet by mouth every morning.          . hydroxypropyl methylcellulose (ISOPTO TEARS) 2.5 % ophthalmic solution   Both Eyes   Place 1 drop into both eyes as needed (dry eyes).         Marland Kitchen ibuprofen (ADVIL,MOTRIN) 200 MG tablet   Oral   Take 800 mg by mouth every 6 (six) hours as needed for pain.         . Multiple Vitamin (MULTIVITAMIN WITH MINERALS) TABS tablet   Oral   Take 1 tablet by mouth every morning.          Marland Kitchen oxyCODONE (OXY IR/ROXICODONE) 5 MG immediate release tablet   Oral   Take 5-10 mg by mouth every 4 (four) hours as needed for pain.         Marland Kitchen  promethazine (PHENERGAN) 25 MG tablet   Oral   Take 1 tablet (25 mg total) by mouth every 6 (six) hours as needed for nausea.   12 tablet   0   . ritonavir (NORVIR) 100 MG capsule   Oral   Take 100 mg by mouth every morning.          . sulfamethoxazole-trimethoprim (BACTRIM DS) 800-160 MG per tablet   Oral   Take 1 tablet by mouth 2 (two) times daily.         . temazepam (RESTORIL) 15 MG capsule   Oral   Take 1 capsule (15 mg total) by mouth at bedtime as needed for sleep.   3 capsule   0    BP 117/82  Pulse 72  Temp(Src) 98.4 F (36.9 C) (Oral)  Resp 14  SpO2 99% Physical Exam  Nursing note and vitals reviewed. Constitutional: He is oriented to person, place, and time. He appears well-developed and well-nourished. No distress.  HENT:  Head: Normocephalic and atraumatic.  Eyes: Pupils are equal, round, and reactive  to light.  Neck: Normal range of motion.  Cardiovascular: Normal rate and regular rhythm.   Pulmonary/Chest: Effort normal and breath sounds normal.  Neurological: He is alert and oriented to person, place, and time.  Skin: Skin is warm. No rash noted.    ED Course  Procedures (including critical care time) Labs Review Labs Reviewed - No data to display Imaging Review No results found.  MDM   1. Malingering     Discussed at length.  Patient's frequent visits.  He'll be given a shot of IM, Toradol, and allowed to follow up with his ID doctor at [facility], NP 08/12/13 0131

## 2013-08-24 ENCOUNTER — Emergency Department (HOSPITAL_COMMUNITY)
Admission: EM | Admit: 2013-08-24 | Discharge: 2013-08-24 | Disposition: A | Discharge status: Home / Self Care | Payer: MEDICAID | Attending: Emergency Medicine | Admitting: Emergency Medicine

## 2013-08-24 ENCOUNTER — Encounter (HOSPITAL_COMMUNITY): Payer: Self-pay | Admitting: Emergency Medicine

## 2013-08-24 DIAGNOSIS — J45909 Unspecified asthma, uncomplicated: Secondary | ICD-10-CM | POA: Insufficient documentation

## 2013-08-24 DIAGNOSIS — Z79899 Other long term (current) drug therapy: Secondary | ICD-10-CM | POA: Insufficient documentation

## 2013-08-24 DIAGNOSIS — R5381 Other malaise: Secondary | ICD-10-CM | POA: Insufficient documentation

## 2013-08-24 DIAGNOSIS — F17200 Nicotine dependence, unspecified, uncomplicated: Secondary | ICD-10-CM | POA: Insufficient documentation

## 2013-08-24 DIAGNOSIS — Z760 Encounter for issue of repeat prescription: Secondary | ICD-10-CM

## 2013-08-24 DIAGNOSIS — G8929 Other chronic pain: Secondary | ICD-10-CM | POA: Insufficient documentation

## 2013-08-24 DIAGNOSIS — R5383 Other fatigue: Secondary | ICD-10-CM

## 2013-08-24 DIAGNOSIS — M549 Dorsalgia, unspecified: Secondary | ICD-10-CM | POA: Insufficient documentation

## 2013-08-24 DIAGNOSIS — Z8679 Personal history of other diseases of the circulatory system: Secondary | ICD-10-CM | POA: Insufficient documentation

## 2013-08-24 DIAGNOSIS — B20 Human immunodeficiency virus [HIV] disease: Secondary | ICD-10-CM | POA: Insufficient documentation

## 2013-08-24 MED ORDER — ALBUTEROL SULFATE HFA 108 (90 BASE) MCG/ACT IN AERS
2.0000 | INHALATION_SPRAY | Freq: Once | RESPIRATORY_TRACT | Status: AC
  Administered 2013-08-24: 2 via RESPIRATORY_TRACT
  Filled 2013-08-24 (×2): qty 6.7

## 2013-08-24 MED ORDER — SULFAMETHOXAZOLE-TRIMETHOPRIM 800-160 MG PO TABS: 1.0000 | ORAL_TABLET | Freq: Every day | ORAL | Status: AC

## 2013-08-24 NOTE — ED Provider Notes (Signed)
CSN: [id number]     Arrival date & time 08/24/13  0218 History   First MD Initiated Contact with Patient 08/24/13 0401     Chief Complaint  Patient presents with  . Weakness    Patient is a 47 y.o. male presenting with weakness. The history is provided by the patient.  Weakness This is a chronic problem. Episode onset: unknown time ago. The problem occurs constantly. The problem has been gradually worsening. Nothing aggravates the symptoms. Nothing relieves the symptoms.  pt reports he "just doesn't feel right" He reports he feels fatigued and recent weight loss He also admits to recent vomiting/diarrhea No active CP/SOB No focal weakness reported He also reports chronic pain in his back  He also requests medication refills as well He reports he is supposed to f/u with ID specialist at [facility] soon  Past Medical History  Diagnosis Date  . HIV (human immunodeficiency virus infection)   . Hepatitis C   . Asthma   . AIDS   . Cerebral hemorrhage    Past Surgical History  Procedure Laterality Date  . Hernia repair    . Mandible surgery    . Laporotomy     Family History  Problem Relation Age of Onset  . CAD Mother   . Stroke Mother    History  Substance Use Topics  . Smoking status: Current Every Day Smoker -- 2.00 packs/day for 38 years    Types: Cigarettes  . Smokeless tobacco: Never Used  . Alcohol Use: Yes     Comment: occasional    Review of Systems  Constitutional: Negative for fever.  Neurological: Positive for weakness.    Allergies  Aspirin  Home Medications   Current Outpatient Rx  Name  Route  Sig  Dispense  Refill  . albuterol (PROVENTIL HFA;VENTOLIN HFA) 108 (90 BASE) MCG/ACT inhaler   Inhalation   Inhale 2 puffs into the lungs every 2 (two) hours as needed for wheezing or shortness of breath (cough).   1 Inhaler   0   . cyclobenzaprine (FLEXERIL) 10 MG tablet   Oral   Take 10 mg by mouth 3 (three) times daily as needed for muscle spasms.        . Darunavir Ethanolate (PREZISTA) 800 MG tablet   Oral   Take 800 mg by mouth daily with breakfast.         . diphenhydrAMINE (BENADRYL) 25 MG tablet   Oral   Take 25 mg by mouth every 6 (six) hours as needed for itching or sleep.         Marland Kitchen emtricitabine-tenofovir (TRUVADA) 200-300 MG per tablet   Oral   Take 1 tablet by mouth every morning.          Marland Kitchen ENSURE (ENSURE)   Oral   Take 237 mLs by mouth 3 (three) times daily between meals.         Marland Kitchen LORazepam (ATIVAN) 1 MG tablet   Oral   Take 1 mg by mouth every 8 (eight) hours as needed for anxiety.         . Multiple Vitamin (MULTIVITAMIN WITH MINERALS) TABS tablet   Oral   Take 1 tablet by mouth every morning.          . ritonavir (NORVIR) 100 MG capsule   Oral   Take 100 mg by mouth every morning.          . sulfamethoxazole-trimethoprim (BACTRIM DS) 800-160 MG per tablet   Oral  Take 1 tablet by mouth daily.          . temazepam (RESTORIL) 15 MG capsule   Oral   Take 1 capsule (15 mg total) by mouth at bedtime as needed for sleep.   3 capsule   0   . promethazine (PHENERGAN) 25 MG tablet   Oral   Take 1 tablet (25 mg total) by mouth every 6 (six) hours as needed for nausea.   12 tablet   0   . sulfamethoxazole-trimethoprim (BACTRIM DS,SEPTRA DS) 800-160 MG per tablet   Oral   Take 1 tablet by mouth daily.   14 tablet   0    BP 115/71  Pulse 76  Temp(Src) 98.4 F (36.9 C)  Resp 20  SpO2 99% Physical Exam CONSTITUTIONAL: Well developed HEAD: Normocephalic/atraumatic EYES: EOMI/PERRL ENMT: Mucous membranes moist NECK: supple no meningeal signs CV: S1/S2 noted, no murmurs/rubs/gallops noted LUNGS: Lungs are clear to auscultation bilaterally, no apparent distress ABDOMEN: soft, nontender, no rebound or guarding NEURO: Pt is awake/alert, moves all extremitiesx4, walks around the room in no distress with steady gait EXTREMITIES: full ROM SKIN: warm, color normal PSYCH: no  abnormalities of mood noted  ED Course  Procedures  MDM   1. Fatigue   2. Medication refill    Previous records reviewed and considered Nursing notes including past medical history and social history reviewed and considered in documentation  Pt in no distress.  When I walked into room he was packing his bags and going through paperwork.  He requests refill of his ativan/albuterol and daily bactrim.  He reports he has all of his HIV meds.  I told him I could refill albuterol/bactrim.  As for fatigue, this appears to be a chronic issue that is best managed by his ID specialists.  I don't feel acute imaging/labs are warranted at this time.     Joya Gaskins, MD 08/24/13 [phone]

## 2013-08-24 NOTE — ED Notes (Signed)
Pt states he is treated at the infectious disease clinic in [facility], pt states his last CD4 count was in the 50s. Pt states he now eats 2 cans on ensure every day to try to keep his nutritional status up as he has lost 75 pds in the last two months. Pt reports N/V, generalized weakness, and body aches.

## 2013-08-24 NOTE — ED Notes (Addendum)
Per EMS, pt has not been feeling well for the past three days. Pt has a hx of hep C and HIV. Pt states he has generalized weakness. VS from EMS: 140/82, Hr: 78, RR: 16, 02: 98. Per EMS pt is A&Ox4. Per EMS pt smells of ETOH.

## 2013-08-24 NOTE — ED Notes (Signed)
Pt reports he "just doesn't feel right" and wants to be checked out.  Also reports he is out of several prescriptions and is hoping to get prescriptions from the ED doctor.

## 2013-09-04 ENCOUNTER — Emergency Department (HOSPITAL_COMMUNITY)
Admission: EM | Admit: 2013-09-04 | Discharge: 2013-09-04 | Disposition: A | Discharge status: Home / Self Care | Payer: MEDICAID | Attending: Emergency Medicine | Admitting: Emergency Medicine

## 2013-09-04 ENCOUNTER — Encounter (HOSPITAL_COMMUNITY): Payer: Self-pay | Admitting: Emergency Medicine

## 2013-09-04 DIAGNOSIS — R110 Nausea: Secondary | ICD-10-CM | POA: Insufficient documentation

## 2013-09-04 DIAGNOSIS — F17200 Nicotine dependence, unspecified, uncomplicated: Secondary | ICD-10-CM | POA: Insufficient documentation

## 2013-09-04 DIAGNOSIS — Z8679 Personal history of other diseases of the circulatory system: Secondary | ICD-10-CM | POA: Insufficient documentation

## 2013-09-04 DIAGNOSIS — Z8619 Personal history of other infectious and parasitic diseases: Secondary | ICD-10-CM | POA: Insufficient documentation

## 2013-09-04 DIAGNOSIS — J45901 Unspecified asthma with (acute) exacerbation: Secondary | ICD-10-CM | POA: Insufficient documentation

## 2013-09-04 DIAGNOSIS — Z760 Encounter for issue of repeat prescription: Secondary | ICD-10-CM

## 2013-09-04 DIAGNOSIS — Z21 Asymptomatic human immunodeficiency virus [HIV] infection status: Secondary | ICD-10-CM | POA: Insufficient documentation

## 2013-09-04 DIAGNOSIS — Z79899 Other long term (current) drug therapy: Secondary | ICD-10-CM | POA: Insufficient documentation

## 2013-09-04 MED ORDER — ALBUTEROL SULFATE HFA 108 (90 BASE) MCG/ACT IN AERS
2.0000 | INHALATION_SPRAY | Freq: Once | RESPIRATORY_TRACT | Status: AC
  Administered 2013-09-04: 2 via RESPIRATORY_TRACT
  Filled 2013-09-04: qty 13.4

## 2013-09-04 MED ORDER — OXYCODONE-ACETAMINOPHEN 5-325 MG PO TABS
2.0000 | ORAL_TABLET | Freq: Once | ORAL | Status: AC
  Administered 2013-09-04: 2 via ORAL
  Filled 2013-09-04: qty 2

## 2013-09-04 MED ORDER — PROMETHAZINE HCL 25 MG PO TABS: 25.0000 mg | ORAL_TABLET | Freq: Four times a day (QID) | ORAL | Status: DC | PRN

## 2013-09-04 NOTE — ED Provider Notes (Signed)
CSN: [id number]     Arrival date & time 09/04/13  0008 History   First MD Initiated Contact with Patient 09/04/13 0057     Chief Complaint  Patient presents with  . Medication Refill  . Emesis   (Consider location/radiation/quality/duration/timing/severity/associated sxs/prior Treatment) HPI Comments: Patient is a 47 y/o male with a hx of HIV, AIDS, Hep C, and asthma who presents for a refill of his albuterol inhaler. Patient states he intermittently has SOB, relieved by his inhaler, but he ran out of this medicine. Patient denies recent fever, cough, hemoptysis, chest pain, and syncope. He currently denies nausea, but states sometimes he gets nauseous because of his HIV and Hep C. Denies any vomiting in the last 24 hours. Patient denies any pain complaints at this time.  The history is provided by the patient. No language interpreter was used.    Past Medical History  Diagnosis Date  . HIV (human immunodeficiency virus infection)   . Hepatitis C   . Asthma   . AIDS   . Cerebral hemorrhage    Past Surgical History  Procedure Laterality Date  . Hernia repair    . Mandible surgery    . Laporotomy     Family History  Problem Relation Age of Onset  . CAD Mother   . Stroke Mother    History  Substance Use Topics  . Smoking status: Current Every Day Smoker -- 2.00 packs/day for 38 years    Types: Cigarettes  . Smokeless tobacco: Never Used  . Alcohol Use: Yes     Comment: occasional    Review of Systems  Respiratory: Positive for shortness of breath (chronic, intermittent).   Gastrointestinal: Positive for nausea.    Allergies  Aspirin  Home Medications   Current Outpatient Rx  Name  Route  Sig  Dispense  Refill  . albuterol (PROVENTIL HFA;VENTOLIN HFA) 108 (90 BASE) MCG/ACT inhaler   Inhalation   Inhale 2 puffs into the lungs every 2 (two) hours as needed for wheezing or shortness of breath (cough).   1 Inhaler   0   . cyclobenzaprine (FLEXERIL) 10 MG tablet  Oral   Take 10 mg by mouth 3 (three) times daily as needed for muscle spasms.         . Darunavir Ethanolate (PREZISTA) 800 MG tablet   Oral   Take 800 mg by mouth daily with breakfast.         . diphenhydrAMINE (BENADRYL) 25 MG tablet   Oral   Take 25 mg by mouth every 6 (six) hours as needed for itching or sleep.         Marland Kitchen emtricitabine-tenofovir (TRUVADA) 200-300 MG per tablet   Oral   Take 1 tablet by mouth every morning.          Marland Kitchen ENSURE (ENSURE)   Oral   Take 237 mLs by mouth 3 (three) times daily between meals.         Marland Kitchen LORazepam (ATIVAN) 1 MG tablet   Oral   Take 1 mg by mouth every 8 (eight) hours as needed for anxiety.         . Multiple Vitamin (MULTIVITAMIN WITH MINERALS) TABS tablet   Oral   Take 1 tablet by mouth every morning.          Marland Kitchen oxyCODONE-acetaminophen (PERCOCET/ROXICET) 5-325 MG per tablet   Oral   Take 2 tablets by mouth every 4 (four) hours as needed for pain.         Marland Kitchen  ritonavir (NORVIR) 100 MG capsule   Oral   Take 100 mg by mouth every morning.          . sulfamethoxazole-trimethoprim (BACTRIM DS) 800-160 MG per tablet   Oral   Take 1 tablet by mouth daily.          . temazepam (RESTORIL) 15 MG capsule   Oral   Take 1 capsule (15 mg total) by mouth at bedtime as needed for sleep.   3 capsule   0   . promethazine (PHENERGAN) 25 MG tablet   Oral   Take 1 tablet (25 mg total) by mouth every 6 (six) hours as needed for nausea.   25 tablet   0    BP 131/87  Pulse 82  Temp(Src) 98.4 F (36.9 C) (Oral)  Resp 18  SpO2 98%  Physical Exam  Nursing note and vitals reviewed. Constitutional: He is oriented to person, place, and time. He appears well-developed and well-nourished. No distress.  HENT:  Head: Normocephalic and atraumatic.  Eyes: Conjunctivae and EOM are normal. No scleral icterus.  Neck: Normal range of motion.  Cardiovascular: Normal rate, regular rhythm, normal heart sounds and intact distal  pulses.   Pulmonary/Chest: Effort normal and breath sounds normal. No respiratory distress. He has no wheezes. He has no rales.  Musculoskeletal: Normal range of motion.  Neurological: He is alert and oriented to person, place, and time.  Skin: Skin is warm and dry. No rash noted. He is not diaphoretic. No erythema. No pallor.  Psychiatric: He has a normal mood and affect. His behavior is normal.    ED Course  Procedures (including critical care time) Labs Review Labs Reviewed - No data to display  Imaging Review No results found.  MDM   1. Encounter for medication refill    Patient presents for a refill of his albuterol inhaler and his nausea medication. Patient without any complaints at this time; no SOB, cough, fevers, or active nausea. Patient well and nontoxic appearing, hemodynamically stable, and afebrile with tachycardia, tachypnea, dyspnea, or hypoxia. Endorses ID follow up in the next few weeks. Albuterol inhaler given today and Rx for phenergan provided. Return precautions discussed and patient agreeable to plan with no unaddressed concerns.    Antony Madura, PA-C 09/09/13 0630

## 2013-09-04 NOTE — ED Notes (Signed)
Pt reports that he is out of his pain and nausea medication and Albuterol inhaler.  Pt also states he feels dehydrated from vomiting.  Pt reports chronic pain and nausea from side effects of HIV.  Pt reports "I do not want to have a refill I just need a dose now, and a new inhaler."

## 2013-09-09 NOTE — ED Provider Notes (Signed)
Medical screening examination/treatment/procedure(s) were performed by non-physician practitioner and as supervising physician I was immediately available for consultation/collaboration.  Sunnie Nielsen, MD 09/09/13 2055

## 2013-09-10 ENCOUNTER — Encounter (HOSPITAL_COMMUNITY): Payer: Self-pay | Admitting: Emergency Medicine

## 2013-09-10 ENCOUNTER — Emergency Department (HOSPITAL_COMMUNITY)
Admission: EM | Admit: 2013-09-10 | Discharge: 2013-09-11 | Disposition: A | Discharge status: Home / Self Care | Payer: MEDICAID | Attending: Emergency Medicine | Admitting: Emergency Medicine

## 2013-09-10 DIAGNOSIS — J45909 Unspecified asthma, uncomplicated: Secondary | ICD-10-CM | POA: Insufficient documentation

## 2013-09-10 DIAGNOSIS — Z8619 Personal history of other infectious and parasitic diseases: Secondary | ICD-10-CM | POA: Insufficient documentation

## 2013-09-10 DIAGNOSIS — R109 Unspecified abdominal pain: Secondary | ICD-10-CM | POA: Insufficient documentation

## 2013-09-10 DIAGNOSIS — Z21 Asymptomatic human immunodeficiency virus [HIV] infection status: Secondary | ICD-10-CM | POA: Insufficient documentation

## 2013-09-10 DIAGNOSIS — Z8679 Personal history of other diseases of the circulatory system: Secondary | ICD-10-CM | POA: Insufficient documentation

## 2013-09-10 DIAGNOSIS — Z79899 Other long term (current) drug therapy: Secondary | ICD-10-CM | POA: Insufficient documentation

## 2013-09-10 DIAGNOSIS — F17200 Nicotine dependence, unspecified, uncomplicated: Secondary | ICD-10-CM | POA: Insufficient documentation
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
## 2013-09-11 MED ORDER — PROMETHAZINE HCL 25 MG PO TABS
25.0000 mg | ORAL_TABLET | Freq: Once | ORAL | Status: AC
  Administered 2013-09-11: 25 mg via ORAL
  Filled 2013-09-11: qty 1

## 2013-09-11 MED ORDER — OXYCODONE-ACETAMINOPHEN 5-325 MG PO TABS
2.0000 | ORAL_TABLET | Freq: Once | ORAL | Status: AC
  Administered 2013-09-11: 2 via ORAL
  Filled 2013-09-11: qty 2

## 2013-09-11 NOTE — ED Provider Notes (Addendum)
CSN: [id number]     Arrival date & time 09/10/13  2259 History   First MD Initiated Contact with Patient 09/11/13 0115     Chief Complaint  Patient presents with  . Medication Refill   (Consider location/radiation/quality/duration/timing/severity/associated sxs/prior Treatment) HPI Patient presents with concern of ongoing abdominal pain.  The pain is right sided, chronic, characteristically the same as the patient's pain always is. There is no no vomiting, fever, chills, confusion, disorientation.  The patient states that he had his medications: Earlier today. He states that he can follow up with his infectious disease doctors tomorrow.  Past Medical History  Diagnosis Date  . HIV (human immunodeficiency virus infection)   . Hepatitis C   . Asthma   . AIDS   . Cerebral hemorrhage    Past Surgical History  Procedure Laterality Date  . Hernia repair    . Mandible surgery    . Laporotomy     Family History  Problem Relation Age of Onset  . CAD Mother   . Stroke Mother    History  Substance Use Topics  . Smoking status: Current Every Day Smoker -- 2.00 packs/day for 38 years    Types: Cigarettes  . Smokeless tobacco: Never Used  . Alcohol Use: Yes     Comment: occasional    Review of Systems  All other systems reviewed and are negative.    Allergies  Aspirin  Home Medications   Current Outpatient Rx  Name  Route  Sig  Dispense  Refill  . albuterol (PROVENTIL HFA;VENTOLIN HFA) 108 (90 BASE) MCG/ACT inhaler   Inhalation   Inhale 2 puffs into the lungs every 2 (two) hours as needed for wheezing or shortness of breath (cough).   1 Inhaler   0   . cyclobenzaprine (FLEXERIL) 10 MG tablet   Oral   Take 10 mg by mouth 3 (three) times daily as needed for muscle spasms.         . Darunavir Ethanolate (PREZISTA) 800 MG tablet   Oral   Take 800 mg by mouth daily with breakfast.         . diphenhydrAMINE (BENADRYL) 25 MG tablet   Oral   Take 25 mg by mouth  every 6 (six) hours as needed for itching or sleep.         Marland Kitchen emtricitabine-tenofovir (TRUVADA) 200-300 MG per tablet   Oral   Take 1 tablet by mouth every morning.          Marland Kitchen ENSURE (ENSURE)   Oral   Take 237 mLs by mouth 3 (three) times daily between meals.         Marland Kitchen LORazepam (ATIVAN) 1 MG tablet   Oral   Take 1 mg by mouth every 8 (eight) hours as needed for anxiety.         . Multiple Vitamin (MULTIVITAMIN WITH MINERALS) TABS tablet   Oral   Take 1 tablet by mouth every morning.          Marland Kitchen oxyCODONE-acetaminophen (PERCOCET/ROXICET) 5-325 MG per tablet   Oral   Take 2 tablets by mouth every 4 (four) hours as needed for pain.         . promethazine (PHENERGAN) 25 MG tablet   Oral   Take 1 tablet (25 mg total) by mouth every 6 (six) hours as needed for nausea.   25 tablet   0   . ritonavir (NORVIR) 100 MG capsule   Oral   Take 100  mg by mouth every morning.          . sulfamethoxazole-trimethoprim (BACTRIM DS) 800-160 MG per tablet   Oral   Take 1 tablet by mouth daily.          . temazepam (RESTORIL) 15 MG capsule   Oral   Take 1 capsule (15 mg total) by mouth at bedtime as needed for sleep.   3 capsule   0    BP 149/96  Pulse 90  Temp(Src) 98.7 F (37.1 C) (Oral)  Resp 16  Ht 5\' 11"  (1.803 m)  Wt 190 lb 0.6 oz (86.2 kg)  BMI 26.52 kg/m2  SpO2 99% Physical Exam  Nursing note and vitals reviewed. Constitutional: He is oriented to person, place, and time. He appears well-developed and well-nourished. No distress.  HENT:  Head: Normocephalic and atraumatic.  Eyes: Conjunctivae and EOM are normal. No scleral icterus.  Neck: Normal range of motion.  Cardiovascular: Normal rate, regular rhythm, normal heart sounds and intact distal pulses.   Pulmonary/Chest: Effort normal and breath sounds normal. No respiratory distress. He has no wheezes. He has no rales.  Abdominal: He exhibits no distension. There is no tenderness. There is no guarding.   Musculoskeletal: Normal range of motion.  Neurological: He is alert and oriented to person, place, and time.  Skin: Skin is warm and dry. No rash noted. He is not diaphoretic. No erythema. No pallor.  Psychiatric: He has a normal mood and affect. His behavior is normal.  Talkative, but little insight into his condition to    ED Course  Procedures (including critical care time) Labs Review Labs Reviewed - No data to display Imaging Review No results found.  MDM   1. Abdominal pain    This patient with HIV, multiple other medical problems now presents with concern of ongoing abdominal pain, no access to medication.  I counseled the patient on the fact that we would not provide new prescriptions for him, including narcotics.  He did receive a single dose here, but will follow up with physicians tomorrow to refill all other medications.  No distress, and with normal vital signs, though the patient is chronically ill, there is no indication for new acute process.    Gerhard Munch, MD 09/11/13 5621  Gerhard Munch, MD 09/17/13 [phone]

## 2013-09-14 ENCOUNTER — Emergency Department (HOSPITAL_COMMUNITY)
Admission: EM | Admit: 2013-09-14 | Discharge: 2013-09-14 | Disposition: A | Discharge status: Home / Self Care | Payer: MEDICAID | Attending: Emergency Medicine | Admitting: Emergency Medicine

## 2013-09-14 ENCOUNTER — Encounter (HOSPITAL_COMMUNITY): Payer: Self-pay | Admitting: Emergency Medicine

## 2013-09-14 DIAGNOSIS — R079 Chest pain, unspecified: Secondary | ICD-10-CM | POA: Insufficient documentation

## 2013-09-14 DIAGNOSIS — G8929 Other chronic pain: Secondary | ICD-10-CM

## 2013-09-14 DIAGNOSIS — F17200 Nicotine dependence, unspecified, uncomplicated: Secondary | ICD-10-CM | POA: Insufficient documentation

## 2013-09-14 DIAGNOSIS — Z79899 Other long term (current) drug therapy: Secondary | ICD-10-CM | POA: Insufficient documentation

## 2013-09-14 DIAGNOSIS — R110 Nausea: Secondary | ICD-10-CM | POA: Insufficient documentation

## 2013-09-14 DIAGNOSIS — Z8679 Personal history of other diseases of the circulatory system: Secondary | ICD-10-CM | POA: Insufficient documentation

## 2013-09-14 DIAGNOSIS — Z21 Asymptomatic human immunodeficiency virus [HIV] infection status: Secondary | ICD-10-CM | POA: Insufficient documentation

## 2013-09-14 DIAGNOSIS — J45909 Unspecified asthma, uncomplicated: Secondary | ICD-10-CM | POA: Insufficient documentation

## 2013-09-14 DIAGNOSIS — R109 Unspecified abdominal pain: Secondary | ICD-10-CM | POA: Insufficient documentation

## 2013-09-14 MED ORDER — ONDANSETRON HCL 4 MG PO TABS: 8.0000 mg | ORAL_TABLET | Freq: Once | ORAL | Status: DC

## 2013-09-14 MED ORDER — IBUPROFEN 800 MG PO TABS: 800.0000 mg | ORAL_TABLET | Freq: Once | ORAL | Status: DC

## 2013-09-14 MED ORDER — IBUPROFEN 800 MG PO TABS
800.0000 mg | ORAL_TABLET | Freq: Once | ORAL | Status: AC
  Administered 2013-09-14: 800 mg via ORAL
  Filled 2013-09-14: qty 1

## 2013-09-14 MED ORDER — ONDANSETRON 8 MG PO TBDP
8.0000 mg | ORAL_TABLET | Freq: Once | ORAL | Status: AC
  Administered 2013-09-14: 8 mg via ORAL
  Filled 2013-09-14: qty 1
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
## 2013-09-14 NOTE — ED Provider Notes (Signed)
CSN: [id number]     Arrival date & time 09/14/13  0243 History   First MD Initiated Contact with Patient 09/14/13 0302     Chief Complaint  Patient presents with  . Medication Refill   (Consider location/radiation/quality/duration/timing/severity/associated sxs/prior Treatment) HPI 47 yo male presents to the ER with complaint of pain and nausea.  Pt c/o chronic right sided abd pain, nausea without vomiting.  Pt reports he was here last night and "Dr Norlene Campbell gave me some pills and told me he couldn't write me prescriptions.  That's ok, because I'll see my [facility] docs on 10/14"  Pt was seen by Dr Jeraldine Loots, on 10/9, and said at that time he would follow up with ID the next day.  Pt has multiple visits to ED for pain.  Pt appears intoxicated, slurring words Past Medical History  Diagnosis Date  . HIV (human immunodeficiency virus infection)   . Hepatitis C   . Asthma   . AIDS   . Cerebral hemorrhage    Past Surgical History  Procedure Laterality Date  . Hernia repair    . Mandible surgery    . Laporotomy     Family History  Problem Relation Age of Onset  . CAD Mother   . Stroke Mother    History  Substance Use Topics  . Smoking status: Current Every Day Smoker -- 2.00 packs/day for 38 years    Types: Cigarettes  . Smokeless tobacco: Never Used  . Alcohol Use: Yes     Comment: occasional    Review of Systems  All other systems reviewed and are negative.    Allergies  Aspirin  Home Medications   Current Outpatient Rx  Name  Route  Sig  Dispense  Refill  . albuterol (PROVENTIL HFA;VENTOLIN HFA) 108 (90 BASE) MCG/ACT inhaler   Inhalation   Inhale 2 puffs into the lungs every 2 (two) hours as needed for wheezing or shortness of breath (cough).   1 Inhaler   0   . cyclobenzaprine (FLEXERIL) 10 MG tablet   Oral   Take 10 mg by mouth 3 (three) times daily as needed for muscle spasms.         . Darunavir Ethanolate (PREZISTA) 800 MG tablet   Oral   Take 800 mg by  mouth daily with breakfast.         . diphenhydrAMINE (BENADRYL) 25 MG tablet   Oral   Take 25 mg by mouth every 6 (six) hours as needed for itching or sleep.         Marland Kitchen emtricitabine-tenofovir (TRUVADA) 200-300 MG per tablet   Oral   Take 1 tablet by mouth every morning.          Marland Kitchen ENSURE (ENSURE)   Oral   Take 237 mLs by mouth 3 (three) times daily between meals.         Marland Kitchen LORazepam (ATIVAN) 1 MG tablet   Oral   Take 1 mg by mouth every 8 (eight) hours as needed for anxiety.         . Multiple Vitamin (MULTIVITAMIN WITH MINERALS) TABS tablet   Oral   Take 1 tablet by mouth every morning.          Marland Kitchen oxyCODONE-acetaminophen (PERCOCET/ROXICET) 5-325 MG per tablet   Oral   Take 2 tablets by mouth every 4 (four) hours as needed for pain.         . promethazine (PHENERGAN) 25 MG tablet   Oral  Take 1 tablet (25 mg total) by mouth every 6 (six) hours as needed for nausea.   25 tablet   0   . ritonavir (NORVIR) 100 MG capsule   Oral   Take 100 mg by mouth every morning.          . sulfamethoxazole-trimethoprim (BACTRIM DS) 800-160 MG per tablet   Oral   Take 1 tablet by mouth daily.          . temazepam (RESTORIL) 15 MG capsule   Oral   Take 1 capsule (15 mg total) by mouth at bedtime as needed for sleep.   3 capsule   0    BP 127/80  Pulse 83  Temp(Src) 97.5 F (36.4 C) (Oral)  Resp 20  Wt 187 lb 9.6 oz (85.095 kg)  BMI 26.18 kg/m2  SpO2 99% Physical Exam  Nursing note and vitals reviewed. Constitutional: He is oriented to person, place, and time. He appears well-developed and well-nourished. No distress.  HENT:  Head: Normocephalic and atraumatic.  Nose: Nose normal.  Mouth/Throat: Oropharynx is clear and moist.  Eyes: Conjunctivae and EOM are normal. Pupils are equal, round, and reactive to light.  Neck: Normal range of motion. Neck supple. No JVD present. No tracheal deviation present. No thyromegaly present.  Cardiovascular: Normal  rate, regular rhythm, normal heart sounds and intact distal pulses.  Exam reveals no gallop and no friction rub.   No murmur heard. Pulmonary/Chest: Effort normal and breath sounds normal. No stridor. No respiratory distress. He has no wheezes. He has no rales. He exhibits no tenderness.  Abdominal: Soft. Bowel sounds are normal. He exhibits no distension and no mass. There is tenderness (mild diffuse abd pain). There is no rebound and no guarding.  Musculoskeletal: Normal range of motion. He exhibits no edema and no tenderness.  Lymphadenopathy:    He has no cervical adenopathy.  Neurological: He is alert and oriented to person, place, and time. He exhibits normal muscle tone. Coordination normal.  Skin: Skin is warm and dry. No rash noted. No erythema. No pallor.  Psychiatric: He has a normal mood and affect. His behavior is normal. Judgment and thought content normal.    ED Course  Procedures (including critical care time) Labs Review Labs Reviewed - No data to display Imaging Review No results found.  EKG Interpretation   None       MDM   1. Chronic pain    47 yo male with reported pain, nausea.  Pt is intoxicated, in no distress.  Will give zofran and ibuprofen.  Advised to f/u with his doctors.  Unable to locate him in care everywhere, despite his reports of going to [facility] for his care.      Olivia Mackie, MD 09/14/13 [phone]

## 2013-09-14 NOTE — ED Notes (Signed)
Patient is alert and oriented x3.  He was given DC instructions and follow up visit instructions.  Patient gave verbal understanding.  He was DC ambulatory under his own power to home.  V/S stable.  He was not showing any signs of distress on DC 

## 2013-09-16 ENCOUNTER — Encounter (HOSPITAL_COMMUNITY): Payer: Self-pay | Admitting: Emergency Medicine

## 2013-09-16 ENCOUNTER — Emergency Department (HOSPITAL_COMMUNITY)
Admission: EM | Admit: 2013-09-16 | Discharge: 2013-09-17 | Disposition: A | Discharge status: Home / Self Care | Payer: Self-pay | Attending: Emergency Medicine | Admitting: Emergency Medicine

## 2013-09-16 DIAGNOSIS — Z8619 Personal history of other infectious and parasitic diseases: Secondary | ICD-10-CM | POA: Insufficient documentation

## 2013-09-16 DIAGNOSIS — Z21 Asymptomatic human immunodeficiency virus [HIV] infection status: Secondary | ICD-10-CM | POA: Insufficient documentation

## 2013-09-16 DIAGNOSIS — J45901 Unspecified asthma with (acute) exacerbation: Secondary | ICD-10-CM | POA: Insufficient documentation

## 2013-09-16 DIAGNOSIS — F17200 Nicotine dependence, unspecified, uncomplicated: Secondary | ICD-10-CM | POA: Insufficient documentation

## 2013-09-16 DIAGNOSIS — Z8679 Personal history of other diseases of the circulatory system: Secondary | ICD-10-CM | POA: Insufficient documentation

## 2013-09-16 DIAGNOSIS — Z760 Encounter for issue of repeat prescription: Secondary | ICD-10-CM | POA: Insufficient documentation

## 2013-09-16 DIAGNOSIS — G8929 Other chronic pain: Secondary | ICD-10-CM | POA: Insufficient documentation

## 2013-09-16 DIAGNOSIS — Z79899 Other long term (current) drug therapy: Secondary | ICD-10-CM | POA: Insufficient documentation

## 2013-09-16 NOTE — ED Notes (Signed)
Pt called for triage no answer may

## 2013-09-16 NOTE — ED Notes (Signed)
Pt. reports generalized body aches for several days , denies injury or fall ,ambulatory.

## 2013-09-16 NOTE — ED Notes (Signed)
Pt called x2 no answer 

## 2013-09-17 MED ORDER — PROMETHAZINE HCL 25 MG RE SUPP: 25.0000 mg | Freq: Four times a day (QID) | RECTAL | Status: DC | PRN

## 2013-09-17 MED ORDER — ALBUTEROL SULFATE HFA 108 (90 BASE) MCG/ACT IN AERS
2.0000 | INHALATION_SPRAY | Freq: Once | RESPIRATORY_TRACT | Status: AC
  Administered 2013-09-17: 2 via RESPIRATORY_TRACT
  Filled 2013-09-17: qty 6.7

## 2013-09-17 MED ORDER — PROMETHAZINE HCL 25 MG PO TABS: 25.0000 mg | ORAL_TABLET | Freq: Four times a day (QID) | ORAL | Status: DC | PRN

## 2013-09-17 MED ORDER — PROMETHAZINE HCL 25 MG PO TABS
25.0000 mg | ORAL_TABLET | Freq: Once | ORAL | Status: AC
  Administered 2013-09-17: 25 mg via ORAL
  Filled 2013-09-17: qty 1

## 2013-09-17 NOTE — ED Notes (Signed)
Pt seen by Dr. Ranae Palms prior to RN assessment, see MD notes, orders received and initiated. RT gave HFA albuterol inhaler, pt given phenergan tab and phenergan Rx, steady gait, out to d/c desk. Pt rolling belongings out in w/c. Eating cookies and drinking water after taking phenergan. Plan is to ride bus in the am.
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
## 2013-09-17 NOTE — ED Notes (Signed)
Pt alert, NAD, calm, interactive, resps e/u, speaking in clear complete sentences, ambulatory with steady gait.

## 2013-09-24 ENCOUNTER — Emergency Department (HOSPITAL_BASED_OUTPATIENT_CLINIC_OR_DEPARTMENT_OTHER)
Admission: EM | Admit: 2013-09-24 | Discharge: 2013-09-24 | Discharge status: Against Medical Advice | Payer: Self-pay | Attending: Emergency Medicine | Admitting: Emergency Medicine

## 2013-09-24 ENCOUNTER — Emergency Department (HOSPITAL_COMMUNITY)
Admission: EM | Admit: 2013-09-24 | Discharge: 2013-09-24 | Discharge status: Against Medical Advice | Payer: MEDICAID | Attending: Emergency Medicine | Admitting: Emergency Medicine

## 2013-09-24 DIAGNOSIS — J45909 Unspecified asthma, uncomplicated: Secondary | ICD-10-CM | POA: Insufficient documentation

## 2013-09-24 DIAGNOSIS — F1010 Alcohol abuse, uncomplicated: Secondary | ICD-10-CM | POA: Insufficient documentation

## 2013-09-24 DIAGNOSIS — F17200 Nicotine dependence, unspecified, uncomplicated: Secondary | ICD-10-CM | POA: Insufficient documentation

## 2013-09-24 DIAGNOSIS — R52 Pain, unspecified: Secondary | ICD-10-CM | POA: Insufficient documentation

## 2013-09-24 DIAGNOSIS — Z21 Asymptomatic human immunodeficiency virus [HIV] infection status: Secondary | ICD-10-CM | POA: Insufficient documentation

## 2013-09-24 NOTE — ED Notes (Addendum)
Pt has ETOH on breath. Pt speaking in a very loud voice. Pt states that he was brought in by EMS [place]. He got very irate when asked about complaint placed in computer by EMS and Registration. Pt started yelling, threw off BP cuff and pulse ox. Pt ran out of triage room and down hall to EMS doors. Pt then started talking to [facility] and complaining about needing meds refill. Security and [employer] witnessed event at bridge. Pt still yelling and stating that he wants to leave. Pt choosing not to be seen, pt offered care and declined. Pt escorted out by [employer].

## 2013-09-24 NOTE — ED Notes (Signed)
Pt abruptly left triage room, wandering thru ED, security notified.

## 2013-09-24 NOTE — ED Notes (Signed)
Advised by reg clerk that pt has been belligerent and is outside with security

## 2013-09-24 NOTE — ED Notes (Signed)
Pt escorted out by security and [employer]  

## 2013-09-24 NOTE — ED Notes (Signed)
After repeated warning by security officer pt continued to be loud and verbally abusive to staff. Pt escorted out of the facility and off the property by Engineer, materials.
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
## 2013-09-24 NOTE — ED Notes (Signed)
Pt called from waiting room. No answer, pt probably in restroom or outside smoking.

## 2013-09-24 NOTE — ED Notes (Signed)
Pt dropped off by EMS, alert, NAD, calm, interactive, ambulatory with steady gait. Unable to find pt, no answer x2.

## 2013-09-26 DIAGNOSIS — Z79899 Other long term (current) drug therapy: Secondary | ICD-10-CM | POA: Insufficient documentation

## 2013-09-26 DIAGNOSIS — M549 Dorsalgia, unspecified: Secondary | ICD-10-CM | POA: Insufficient documentation

## 2013-09-26 DIAGNOSIS — Z792 Long term (current) use of antibiotics: Secondary | ICD-10-CM | POA: Insufficient documentation

## 2013-09-26 DIAGNOSIS — Z760 Encounter for issue of repeat prescription: Secondary | ICD-10-CM | POA: Insufficient documentation

## 2013-09-26 DIAGNOSIS — J45909 Unspecified asthma, uncomplicated: Secondary | ICD-10-CM | POA: Insufficient documentation

## 2013-09-26 DIAGNOSIS — F10229 Alcohol dependence with intoxication, unspecified: Secondary | ICD-10-CM | POA: Insufficient documentation

## 2013-09-26 DIAGNOSIS — Z21 Asymptomatic human immunodeficiency virus [HIV] infection status: Secondary | ICD-10-CM | POA: Insufficient documentation

## 2013-09-26 DIAGNOSIS — Z8619 Personal history of other infectious and parasitic diseases: Secondary | ICD-10-CM | POA: Insufficient documentation

## 2013-09-26 DIAGNOSIS — F17200 Nicotine dependence, unspecified, uncomplicated: Secondary | ICD-10-CM | POA: Insufficient documentation

## 2013-09-27 ENCOUNTER — Emergency Department (HOSPITAL_COMMUNITY)
Admission: EM | Admit: 2013-09-27 | Discharge: 2013-09-27 | Disposition: A | Discharge status: Home / Self Care | Payer: MEDICAID | Attending: Emergency Medicine | Admitting: Emergency Medicine

## 2013-09-27 ENCOUNTER — Encounter (HOSPITAL_COMMUNITY): Payer: Self-pay | Admitting: Emergency Medicine

## 2013-09-27 DIAGNOSIS — Z760 Encounter for issue of repeat prescription: Secondary | ICD-10-CM
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
## 2013-09-27 NOTE — ED Notes (Signed)
Pt walked out of ER; pt did not wish to wait for DC paperwork
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
## 2013-09-27 NOTE — ED Provider Notes (Signed)
Medical screening examination/treatment/procedure(s) were performed by non-physician practitioner and as supervising physician I was immediately available for consultation/collaboration.    Olivia Mackie, MD 09/27/13 2142

## 2013-09-27 NOTE — ED Notes (Signed)
PA in to see pt; advises pt that due to ETOH intake pt will not be receiving pain medications

## 2013-11-16 ENCOUNTER — Emergency Department (HOSPITAL_COMMUNITY)
Admission: EM | Admit: 2013-11-16 | Discharge: 2013-11-17 | Disposition: A | Discharge status: Home / Self Care | Payer: Self-pay | Attending: Emergency Medicine | Admitting: Emergency Medicine

## 2013-11-16 ENCOUNTER — Encounter (HOSPITAL_COMMUNITY): Payer: Self-pay | Admitting: Emergency Medicine

## 2013-11-16 ENCOUNTER — Emergency Department (HOSPITAL_COMMUNITY): Payer: Self-pay

## 2013-11-16 DIAGNOSIS — Z760 Encounter for issue of repeat prescription: Secondary | ICD-10-CM | POA: Insufficient documentation

## 2013-11-16 DIAGNOSIS — Z21 Asymptomatic human immunodeficiency virus [HIV] infection status: Secondary | ICD-10-CM | POA: Insufficient documentation

## 2013-11-16 DIAGNOSIS — Z79899 Other long term (current) drug therapy: Secondary | ICD-10-CM | POA: Insufficient documentation

## 2013-11-16 DIAGNOSIS — R1110 Vomiting, unspecified: Secondary | ICD-10-CM | POA: Insufficient documentation

## 2013-11-16 DIAGNOSIS — F10929 Alcohol use, unspecified with intoxication, unspecified: Secondary | ICD-10-CM

## 2013-11-16 DIAGNOSIS — Z8679 Personal history of other diseases of the circulatory system: Secondary | ICD-10-CM | POA: Insufficient documentation

## 2013-11-16 DIAGNOSIS — J45901 Unspecified asthma with (acute) exacerbation: Secondary | ICD-10-CM | POA: Insufficient documentation

## 2013-11-16 DIAGNOSIS — R269 Unspecified abnormalities of gait and mobility: Secondary | ICD-10-CM | POA: Insufficient documentation

## 2013-11-16 DIAGNOSIS — Z8619 Personal history of other infectious and parasitic diseases: Secondary | ICD-10-CM | POA: Insufficient documentation

## 2013-11-16 DIAGNOSIS — F1010 Alcohol abuse, uncomplicated: Secondary | ICD-10-CM | POA: Insufficient documentation

## 2013-11-16 DIAGNOSIS — F17200 Nicotine dependence, unspecified, uncomplicated: Secondary | ICD-10-CM | POA: Insufficient documentation

## 2013-11-16 LAB — COMPREHENSIVE METABOLIC PANEL
ALT: 119 U/L — ABNORMAL HIGH (ref 0–53)
AST: 227 U/L — ABNORMAL HIGH (ref 0–37)
Alkaline Phosphatase: 77 U/L (ref 39–117)
CO2: 23 mEq/L (ref 19–32)
Chloride: 89 mEq/L — ABNORMAL LOW (ref 96–112)
Creatinine, Ser: 0.81 mg/dL (ref 0.50–1.35)
GFR calc Af Amer: >90 mL/min (ref >90)
GFR calc non Af Amer: >90 mL/min (ref >90)
Glucose, Bld: 92 mg/dL (ref 70–99)
Potassium: 3.4 mEq/L — ABNORMAL LOW (ref 3.5–5.1)
Sodium: 126 mEq/L — ABNORMAL LOW (ref 135–145)
Total Bilirubin: 0.9 mg/dL (ref 0.3–1.2)

## 2013-11-16 LAB — RAPID URINE DRUG SCREEN, HOSP PERFORMED
Amphetamines: NOT DETECTED
Barbiturates: NOT DETECTED
Tetrahydrocannabinol: NOT DETECTED

## 2013-11-16 LAB — CBC
MCV: 90.6 fL (ref 78.0–100.0)
Platelets: 122 10*3/uL — ABNORMAL LOW (ref 150–400)
RBC: 3.94 MIL/uL — ABNORMAL LOW (ref 4.22–5.81)
WBC: 3.5 10*3/uL — ABNORMAL LOW (ref 4.0–10.5)

## 2013-11-16 MED ORDER — SODIUM CHLORIDE 0.9 % IV BOLUS (SEPSIS): 1000.0000 mL | Freq: Once | INTRAVENOUS | Status: DC

## 2013-11-16 MED ORDER — ALBUTEROL SULFATE HFA 108 (90 BASE) MCG/ACT IN AERS: 2.0000 | INHALATION_SPRAY | RESPIRATORY_TRACT | Status: DC | PRN

## 2013-11-16 MED ORDER — RITONAVIR 100 MG PO CAPS: 100.0000 mg | ORAL_CAPSULE | Freq: Every morning | ORAL | Status: DC

## 2013-11-16 MED ORDER — ONDANSETRON 4 MG PO TBDP: 4.0000 mg | ORAL_TABLET | Freq: Once | ORAL | Status: DC

## 2013-11-16 MED ORDER — DARUNAVIR ETHANOLATE 800 MG PO TABS: 800.0000 mg | ORAL_TABLET | Freq: Every day | ORAL | Status: DC

## 2013-11-16 MED ORDER — OXYCODONE-ACETAMINOPHEN 5-325 MG PO TABS: 2.0000 | ORAL_TABLET | Freq: Once | ORAL | Status: DC

## 2013-11-16 MED ORDER — OXYCODONE-ACETAMINOPHEN 5-325 MG PO TABS
2.0000 | ORAL_TABLET | Freq: Once | ORAL | Status: AC
  Administered 2013-11-17: 2 via ORAL
  Filled 2013-11-16: qty 2

## 2013-11-16 MED ORDER — SULFAMETHOXAZOLE-TMP DS 800-160 MG PO TABS
1.0000 | ORAL_TABLET | Freq: Once | ORAL | Status: AC
  Administered 2013-11-17: 1 via ORAL
  Filled 2013-11-16: qty 1

## 2013-11-16 MED ORDER — DARUNAVIR ETHANOLATE 800 MG PO TABS
800.0000 mg | ORAL_TABLET | Freq: Once | ORAL | Status: AC
  Administered 2013-11-17: 800 mg via ORAL
  Filled 2013-11-16: qty 1

## 2013-11-16 MED ORDER — SULFAMETHOXAZOLE-TMP DS 800-160 MG PO TABS: 1.0000 | ORAL_TABLET | Freq: Every day | ORAL | Status: DC

## 2013-11-16 MED ORDER — EMTRICITABINE-TENOFOVIR DF 200-300 MG PO TABS: 1.0000 | ORAL_TABLET | Freq: Every morning | ORAL | Status: DC

## 2013-11-16 MED ORDER — EMTRICITABINE-TENOFOVIR DF 200-300 MG PO TABS
1.0000 | ORAL_TABLET | Freq: Once | ORAL | Status: AC
  Administered 2013-11-17: 1 via ORAL
  Filled 2013-11-16: qty 1

## 2013-11-16 MED ORDER — RITONAVIR 100 MG PO TABS
100.0000 mg | ORAL_TABLET | Freq: Once | ORAL | Status: AC
  Administered 2013-11-17: 100 mg via ORAL
  Filled 2013-11-16: qty 1
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
## 2013-11-17 MED ORDER — SULFAMETHOXAZOLE-TMP DS 800-160 MG PO TABS: 1.0000 | ORAL_TABLET | Freq: Every day | ORAL | Status: DC

## 2013-11-17 MED ORDER — DARUNAVIR ETHANOLATE 800 MG PO TABS: 800.0000 mg | ORAL_TABLET | Freq: Every day | ORAL | Status: DC

## 2013-11-17 MED ORDER — EMTRICITABINE-TENOFOVIR DF 200-300 MG PO TABS: 1.0000 | ORAL_TABLET | Freq: Every morning | ORAL | Status: DC

## 2013-11-17 MED ORDER — ONDANSETRON HCL 4 MG/2ML IJ SOLN
4.0000 mg | Freq: Once | INTRAMUSCULAR | Status: AC
  Administered 2013-11-17: 4 mg via INTRAVENOUS
  Filled 2013-11-17: qty 2

## 2013-11-17 MED ORDER — EMTRICITABINE-TENOFOVIR DF 200-300 MG PO TABS: 1.0000 | ORAL_TABLET | Freq: Once | ORAL | Status: DC

## 2013-11-17 MED ORDER — SODIUM CHLORIDE 0.9 % IV SOLN
Freq: Once | INTRAVENOUS | Status: AC
  Administered 2013-11-17: 02:00:00 via INTRAVENOUS

## 2013-11-17 MED ORDER — ALBUTEROL SULFATE HFA 108 (90 BASE) MCG/ACT IN AERS: 2.0000 | INHALATION_SPRAY | Freq: Four times a day (QID) | RESPIRATORY_TRACT | Status: DC | PRN

## 2013-11-17 MED ORDER — RITONAVIR 100 MG PO CAPS: 100.0000 mg | ORAL_CAPSULE | Freq: Every morning | ORAL | Status: DC

## 2013-11-17 NOTE — ED Notes (Signed)
3 belonging bags located in [place] locker #26

## 2013-11-17 NOTE — ED Provider Notes (Signed)
Medical screening examination/treatment/procedure(s) were performed by non-physician practitioner and as supervising physician I was immediately available for consultation/collaboration.  EKG Interpretation   None         Gwyneth Sprout, MD 11/17/13 [phone]

## 2013-11-28 DIAGNOSIS — Z760 Encounter for issue of repeat prescription: Secondary | ICD-10-CM | POA: Insufficient documentation

## 2013-11-28 DIAGNOSIS — G8929 Other chronic pain: Secondary | ICD-10-CM | POA: Insufficient documentation

## 2013-11-28 DIAGNOSIS — M545 Low back pain, unspecified: Secondary | ICD-10-CM | POA: Insufficient documentation

## 2013-11-28 DIAGNOSIS — Z8669 Personal history of other diseases of the nervous system and sense organs: Secondary | ICD-10-CM | POA: Insufficient documentation

## 2013-11-28 DIAGNOSIS — F17200 Nicotine dependence, unspecified, uncomplicated: Secondary | ICD-10-CM | POA: Insufficient documentation

## 2013-11-28 DIAGNOSIS — Z8619 Personal history of other infectious and parasitic diseases: Secondary | ICD-10-CM | POA: Insufficient documentation

## 2013-11-28 DIAGNOSIS — B20 Human immunodeficiency virus [HIV] disease: Secondary | ICD-10-CM | POA: Insufficient documentation

## 2013-11-28 DIAGNOSIS — Z79899 Other long term (current) drug therapy: Secondary | ICD-10-CM | POA: Insufficient documentation

## 2013-11-28 DIAGNOSIS — J45909 Unspecified asthma, uncomplicated: Secondary | ICD-10-CM | POA: Insufficient documentation

## 2013-11-28 DIAGNOSIS — Z792 Long term (current) use of antibiotics: Secondary | ICD-10-CM | POA: Insufficient documentation

## 2013-11-29 ENCOUNTER — Encounter (HOSPITAL_COMMUNITY): Payer: Self-pay | Admitting: Emergency Medicine

## 2013-11-29 ENCOUNTER — Emergency Department (HOSPITAL_COMMUNITY)
Admission: EM | Admit: 2013-11-29 | Discharge: 2013-11-29 | Disposition: A | Discharge status: Home / Self Care | Payer: Self-pay | Attending: Emergency Medicine | Admitting: Emergency Medicine

## 2013-11-29 DIAGNOSIS — G8929 Other chronic pain: Secondary | ICD-10-CM

## 2013-11-29 MED ORDER — OXYCODONE-ACETAMINOPHEN 5-325 MG PO TABS
2.0000 | ORAL_TABLET | Freq: Once | ORAL | Status: AC
  Administered 2013-11-29: 2 via ORAL
  Filled 2013-11-29: qty 2

## 2013-11-29 NOTE — ED Notes (Signed)
Pt reports being a patient of [facility]. Pt states he went to the Infectious Disease doctor today, however a provider wasn't available to write a prescription for his pain medications. Pt reports chronic back pain.

## 2013-11-29 NOTE — ED Provider Notes (Signed)
Medical screening examination/treatment/procedure(s) were performed by non-physician practitioner and as supervising physician I was immediately available for consultation/collaboration.   Sunnie Nielsen, MD 11/29/13 [phone]

## 2013-11-29 NOTE — ED Provider Notes (Signed)
CSN: [id number]     Arrival date & time 11/28/13  2333 History   First MD Initiated Contact with Patient 11/29/13 0035     No chief complaint on file.  (Consider location/radiation/quality/duration/timing/severity/associated sxs/prior Treatment) HPI Comments: Patient with history of chronic lower back pain, HIV, Hepatits C and lymphoma who presents to the ED for pain control - he states that he went to see his PCP at [facility] today and they wrote his medications but did not write a paper prescription for the oxycodone that he normally takes, he states that he will talk with the on call MD tomorrow and see them, but he needs two pills here tonight.  He reports all other medication is good - reports his back pain is at it's usual level, denies fever, chills, nausea, vomiting, radiation of the pain.  He also denies weakness, numbness, tingling, loss of control of bowels or bladder.  The history is provided by the patient. No language interpreter was used.    Past Medical History  Diagnosis Date  . HIV (human immunodeficiency virus infection)   . Hepatitis C   . Asthma   . AIDS   . Cerebral hemorrhage    Past Surgical History  Procedure Laterality Date  . Hernia repair    . Mandible surgery    . Laporotomy     Family History  Problem Relation Age of Onset  . CAD Mother   . Stroke Mother    History  Substance Use Topics  . Smoking status: Current Every Day Smoker -- 2.00 packs/day for 38 years    Types: Cigarettes  . Smokeless tobacco: Never Used  . Alcohol Use: Yes     Comment: occasional    Review of Systems  All other systems reviewed and are negative.    Allergies  Aspirin  Home Medications   Current Outpatient Rx  Name  Route  Sig  Dispense  Refill  . albuterol (PROVENTIL HFA;VENTOLIN HFA) 108 (90 BASE) MCG/ACT inhaler   Inhalation   Inhale 2 puffs into the lungs every 6 (six) hours as needed for wheezing or shortness of breath (cough).   1 Inhaler    0   . caffeine 200 MG TABS tablet   Oral   Take 200 mg by mouth daily.         . Darunavir Ethanolate (PREZISTA) 800 MG tablet   Oral   Take 1 tablet (800 mg total) by mouth daily with breakfast.   30 tablet   0   . diphenhydrAMINE (BENADRYL) 25 mg capsule   Oral   Take 25 mg by mouth daily as needed for allergies.         Marland Kitchen emtricitabine-tenofovir (TRUVADA) 200-300 MG per tablet   Oral   Take 1 tablet by mouth every morning.   30 tablet   0   . ibuprofen (ADVIL,MOTRIN) 200 MG tablet   Oral   Take 800 mg by mouth daily.         Marland Kitchen LORazepam (ATIVAN) 1 MG tablet   Oral   Take 1 mg by mouth every 8 (eight) hours as needed for anxiety.         Marland Kitchen oxyCODONE-acetaminophen (PERCOCET/ROXICET) 5-325 MG per tablet   Oral   Take 2 tablets by mouth every 4 (four) hours as needed for pain.         . ritonavir (NORVIR) 100 MG capsule   Oral   Take 1 capsule (100 mg total) by mouth  every morning.   30 capsule   0   . sulfamethoxazole-trimethoprim (BACTRIM DS) 800-160 MG per tablet   Oral   Take 1 tablet by mouth daily.   30 tablet   0    There were no vitals taken for this visit. Physical Exam  Nursing note and vitals reviewed. Constitutional: He is oriented to person, place, and time. He appears well-developed and well-nourished. No distress.  HENT:  Head: Normocephalic and atraumatic.  Right Ear: External ear normal.  Left Ear: External ear normal.  Nose: Nose normal.  Mouth/Throat: Oropharynx is clear and moist. No oropharyngeal exudate.  Eyes: Conjunctivae are normal. Pupils are equal, round, and reactive to light. No scleral icterus.  Neck: Normal range of motion. Neck supple.  Cardiovascular: Normal rate, regular rhythm and normal heart sounds.  Exam reveals no gallop and no friction rub.   No murmur heard. Pulmonary/Chest: Effort normal and breath sounds normal. No respiratory distress. He has no wheezes. He has no rales. He exhibits no tenderness.   Abdominal: Soft. Bowel sounds are normal. He exhibits no distension. There is no tenderness.  Musculoskeletal: Normal range of motion. He exhibits tenderness. He exhibits no edema.       Lumbar back: He exhibits tenderness. He exhibits normal range of motion, no bony tenderness, no swelling and no edema.       Back:  Lymphadenopathy:    He has no cervical adenopathy.  Neurological: He is alert and oriented to person, place, and time. He has normal reflexes. He exhibits normal muscle tone. Coordination normal.  Skin: Skin is warm and dry. No rash noted. No erythema. No pallor.  Psychiatric: He has a normal mood and affect. His behavior is normal. Judgment and thought content normal.    ED Course  Procedures (including critical care time) Labs Review Labs Reviewed - No data to display Imaging Review No results found.  EKG Interpretation   None       MDM  Chronic back pain  Discussed with the patient the limitations of treating chronic pain in the ED.  Will give 2 percocet's here for pain, he will follow up with his PCP tomorrow to get refills of his pain medication.   Izola Price Marisue Humble, [place] 11/29/13 [phone]

## 2013-11-29 NOTE — ED Notes (Signed)
Bed: WTR5 Expected date:  Expected time:  Means of arrival:  Comments: Closed 

## 2013-12-02 ENCOUNTER — Encounter (HOSPITAL_COMMUNITY): Payer: Self-pay | Admitting: Emergency Medicine

## 2013-12-02 ENCOUNTER — Emergency Department (HOSPITAL_COMMUNITY)
Admission: EM | Admit: 2013-12-02 | Discharge: 2013-12-02 | Disposition: A | Discharge status: Home / Self Care | Payer: Self-pay | Attending: Emergency Medicine | Admitting: Emergency Medicine

## 2013-12-02 DIAGNOSIS — Z792 Long term (current) use of antibiotics: Secondary | ICD-10-CM | POA: Insufficient documentation

## 2013-12-02 DIAGNOSIS — Z21 Asymptomatic human immunodeficiency virus [HIV] infection status: Secondary | ICD-10-CM | POA: Insufficient documentation

## 2013-12-02 DIAGNOSIS — S61409A Unspecified open wound of unspecified hand, initial encounter: Secondary | ICD-10-CM | POA: Insufficient documentation

## 2013-12-02 DIAGNOSIS — S51809A Unspecified open wound of unspecified forearm, initial encounter: Secondary | ICD-10-CM | POA: Insufficient documentation

## 2013-12-02 DIAGNOSIS — T07XXXA Unspecified multiple injuries, initial encounter: Secondary | ICD-10-CM

## 2013-12-02 DIAGNOSIS — J45909 Unspecified asthma, uncomplicated: Secondary | ICD-10-CM | POA: Insufficient documentation

## 2013-12-02 DIAGNOSIS — Z791 Long term (current) use of non-steroidal anti-inflammatories (NSAID): Secondary | ICD-10-CM | POA: Insufficient documentation

## 2013-12-02 DIAGNOSIS — Z8679 Personal history of other diseases of the circulatory system: Secondary | ICD-10-CM | POA: Insufficient documentation

## 2013-12-02 DIAGNOSIS — Y939 Activity, unspecified: Secondary | ICD-10-CM | POA: Insufficient documentation

## 2013-12-02 DIAGNOSIS — Z79899 Other long term (current) drug therapy: Secondary | ICD-10-CM | POA: Insufficient documentation

## 2013-12-02 DIAGNOSIS — IMO0002 Reserved for concepts with insufficient information to code with codable children: Secondary | ICD-10-CM | POA: Insufficient documentation

## 2013-12-02 DIAGNOSIS — W010XXA Fall on same level from slipping, tripping and stumbling without subsequent striking against object, initial encounter: Secondary | ICD-10-CM | POA: Insufficient documentation

## 2013-12-02 DIAGNOSIS — S51009A Unspecified open wound of unspecified elbow, initial encounter: Secondary | ICD-10-CM | POA: Insufficient documentation

## 2013-12-02 DIAGNOSIS — S61209A Unspecified open wound of unspecified finger without damage to nail, initial encounter: Secondary | ICD-10-CM | POA: Insufficient documentation

## 2013-12-02 DIAGNOSIS — Z8619 Personal history of other infectious and parasitic diseases: Secondary | ICD-10-CM | POA: Insufficient documentation

## 2013-12-02 DIAGNOSIS — T798XXA Other early complications of trauma, initial encounter: Secondary | ICD-10-CM

## 2013-12-02 DIAGNOSIS — Z23 Encounter for immunization: Secondary | ICD-10-CM | POA: Insufficient documentation

## 2013-12-02 DIAGNOSIS — Y929 Unspecified place or not applicable: Secondary | ICD-10-CM | POA: Insufficient documentation

## 2013-12-02 DIAGNOSIS — F17200 Nicotine dependence, unspecified, uncomplicated: Secondary | ICD-10-CM | POA: Insufficient documentation

## 2013-12-02 MED ORDER — ALBUTEROL SULFATE HFA 108 (90 BASE) MCG/ACT IN AERS
2.0000 | INHALATION_SPRAY | RESPIRATORY_TRACT | Status: DC | PRN
  Filled 2013-12-02: qty 6.7

## 2013-12-02 MED ORDER — CEPHALEXIN 500 MG PO CAPS: 500.0000 mg | ORAL_CAPSULE | Freq: Four times a day (QID) | ORAL | Status: DC

## 2013-12-02 MED ORDER — TETANUS-DIPHTH-ACELL PERTUSSIS 5-2.5-18.5 LF-MCG/0.5 IM SUSP
0.5000 mL | Freq: Once | INTRAMUSCULAR | Status: AC
  Administered 2013-12-02: 0.5 mL via INTRAMUSCULAR
  Filled 2013-12-02: qty 0.5

## 2013-12-02 NOTE — ED Notes (Signed)
Pt states he slipped and fell and now has lacerations on his R arm. No LOC. No head trauma. Hypertension.

## 2013-12-02 NOTE — ED Notes (Signed)
Bacitracin and band-aids placed on wounds. Pt instructed on wound care.

## 2013-12-02 NOTE — ED Provider Notes (Signed)
  Medical screening examination/treatment/procedure(s) were performed by non-physician practitioner and as supervising physician I was immediately available for consultation/collaboration.  EKG Interpretation   None          Gerhard Munch, MD 12/02/13 [phone]

## 2013-12-02 NOTE — ED Provider Notes (Signed)
CSN: [id number]     Arrival date & time 12/02/13  2006 History  This chart was scribed for non-physician practitioner, Ivonne Andrew, PA-C,working with Gerhard Munch, MD, by Karle Plumber, ED Scribe.  This patient was seen in room WTR7/WTR7 and the patient's care was started at 10:19 PM.    Chief Complaint  Patient presents with  . Fall  . Extremity Laceration   HPI HPI Comments:  Steven [name] is a 47 y.o. homeless male who presents to the Emergency Department complaining of a right upper extremity laceration. He states he slipped and fell on a rock two days ago. Pt is unaware of his last known tetanus vaccination. He reports h/o HIV, Hepatitis C and asthma. He reports taking Bactrim DS daily. He denies LOC or head trauma. He reports tobacco use. No fever, chills or sweats. No other aggravating or alleviating factors. No associated symptoms.  Patient reports last CD4 count was 190.  Past Medical History  Diagnosis Date  . HIV (human immunodeficiency virus infection)   . Hepatitis C   . Asthma   . AIDS   . Cerebral hemorrhage    Past Surgical History  Procedure Laterality Date  . Hernia repair    . Mandible surgery    . Laporotomy     Family History  Problem Relation Age of Onset  . CAD Mother   . Stroke Mother    History  Substance Use Topics  . Smoking status: Current Every Day Smoker -- 2.00 packs/day for 38 years    Types: Cigarettes  . Smokeless tobacco: Never Used  . Alcohol Use: Yes     Comment: occasional    Review of Systems  Skin: Positive for wound (right arm, palm, and ring finger).  Neurological: Negative for syncope.  All other systems reviewed and are negative.    Allergies  Aspirin  Home Medications   Current Outpatient Rx  Name  Route  Sig  Dispense  Refill  . albuterol (PROVENTIL HFA;VENTOLIN HFA) 108 (90 BASE) MCG/ACT inhaler   Inhalation   Inhale 2 puffs into the lungs every 6 (six) hours as needed for wheezing or shortness of  breath (cough).   1 Inhaler   0   . caffeine 200 MG TABS tablet   Oral   Take 200 mg by mouth daily.         . Darunavir Ethanolate (PREZISTA) 800 MG tablet   Oral   Take 1 tablet (800 mg total) by mouth daily with breakfast.   30 tablet   0   . diphenhydrAMINE (BENADRYL) 25 mg capsule   Oral   Take 25 mg by mouth daily as needed for allergies.         Marland Kitchen emtricitabine-tenofovir (TRUVADA) 200-300 MG per tablet   Oral   Take 1 tablet by mouth every morning.   30 tablet   0   . ibuprofen (ADVIL,MOTRIN) 200 MG tablet   Oral   Take 800 mg by mouth daily.         Marland Kitchen LORazepam (ATIVAN) 1 MG tablet   Oral   Take 1 mg by mouth every 8 (eight) hours as needed for anxiety.         Marland Kitchen oxyCODONE-acetaminophen (PERCOCET/ROXICET) 5-325 MG per tablet   Oral   Take 2 tablets by mouth every 4 (four) hours as needed for pain.         . ritonavir (NORVIR) 100 MG capsule   Oral   Take 1 capsule (  100 mg total) by mouth every morning.   30 capsule   0   . sulfamethoxazole-trimethoprim (BACTRIM DS) 800-160 MG per tablet   Oral   Take 1 tablet by mouth daily.   30 tablet   0    Triage Vitals: BP 158/93  Pulse 61  Temp(Src) 98 F (36.7 C) (Oral)  Resp 20  SpO2 99% Physical Exam  Nursing note and vitals reviewed. Constitutional: He is oriented to person, place, and time. He appears well-developed and well-nourished.  HENT:  Head: Normocephalic and atraumatic.  Eyes: EOM are normal.  Neck: Normal range of motion.  Cardiovascular: Normal rate.   Pulmonary/Chest: Effort normal.  Musculoskeletal: Normal range of motion.  Neurological: He is alert and oriented to person, place, and time.  Skin: Skin is warm and dry.  Scrape to right elbow, posterior forearm with surrounding erythema. Scrapes and blisters to right palm and right ring finger.  Psychiatric: He has a normal mood and affect. His behavior is normal.    ED Course  Procedures  DIAGNOSTIC STUDIES: Oxygen  Saturation is 99% on RA, normal by my interpretation.   COORDINATION OF CARE: 10:25 PM- Will clean and redress wounds. Will apply antibiotic ointment and update tetanus prior to discharge. Will prescribe oral antibiotic. Will provide pt with albuterol MDI for asthma. Pt verbalizes understanding and agrees to plan.  Surrounding erythema to the wound on the posterior proximal forearm concerning for mild cellulitis. Given immunocompromise Will give prescription for Keflex. Patient is on daily Bactrim.  Medications  albuterol (PROVENTIL HFA;VENTOLIN HFA) 108 (90 BASE) MCG/ACT inhaler 2 puff (not administered)  Tdap (BOOSTRIX) injection 0.5 mL (0.5 mLs Intramuscular Given 12/02/13 2301)     MDM   1. Abrasions of multiple sites   2. Wound infection, initial encounter    I personally performed the services described in this documentation, which was scribed in my presence. The recorded information has been reviewed and is accurate.     Angus Seller, PA-C 12/02/13 [phone]

## 2013-12-06 DIAGNOSIS — J45909 Unspecified asthma, uncomplicated: Secondary | ICD-10-CM | POA: Insufficient documentation

## 2013-12-06 DIAGNOSIS — M2550 Pain in unspecified joint: Secondary | ICD-10-CM | POA: Insufficient documentation

## 2013-12-06 DIAGNOSIS — Z21 Asymptomatic human immunodeficiency virus [HIV] infection status: Secondary | ICD-10-CM | POA: Insufficient documentation

## 2013-12-07 ENCOUNTER — Emergency Department (HOSPITAL_COMMUNITY)
Admission: EM | Admit: 2013-12-07 | Discharge: 2013-12-07 | Discharge status: Against Medical Advice | Payer: Self-pay | Attending: Emergency Medicine | Admitting: Emergency Medicine

## 2013-12-07 ENCOUNTER — Encounter (HOSPITAL_COMMUNITY): Payer: Self-pay | Admitting: Emergency Medicine

## 2013-12-07 NOTE — ED Notes (Signed)
Patient just wants to have 1 dose of medications that he is out of

## 2013-12-07 NOTE — ED Notes (Signed)
Informed [employer] that he had to leave because of other obligations.

## 2014-02-05 ENCOUNTER — Emergency Department (HOSPITAL_COMMUNITY): Payer: Self-pay

## 2014-02-05 ENCOUNTER — Encounter (HOSPITAL_COMMUNITY): Payer: Self-pay | Admitting: Emergency Medicine

## 2014-02-05 ENCOUNTER — Emergency Department (HOSPITAL_COMMUNITY)
Admission: EM | Admit: 2014-02-05 | Discharge: 2014-02-05 | Disposition: A | Discharge status: Home / Self Care | Payer: Self-pay | Attending: Emergency Medicine | Admitting: Emergency Medicine

## 2014-02-05 DIAGNOSIS — J45909 Unspecified asthma, uncomplicated: Secondary | ICD-10-CM | POA: Insufficient documentation

## 2014-02-05 DIAGNOSIS — Z8619 Personal history of other infectious and parasitic diseases: Secondary | ICD-10-CM | POA: Insufficient documentation

## 2014-02-05 DIAGNOSIS — R5383 Other fatigue: Secondary | ICD-10-CM

## 2014-02-05 DIAGNOSIS — F17200 Nicotine dependence, unspecified, uncomplicated: Secondary | ICD-10-CM | POA: Insufficient documentation

## 2014-02-05 DIAGNOSIS — R52 Pain, unspecified: Secondary | ICD-10-CM | POA: Insufficient documentation

## 2014-02-05 DIAGNOSIS — R6883 Chills (without fever): Secondary | ICD-10-CM | POA: Insufficient documentation

## 2014-02-05 DIAGNOSIS — J209 Acute bronchitis, unspecified: Secondary | ICD-10-CM | POA: Insufficient documentation

## 2014-02-05 DIAGNOSIS — Z79899 Other long term (current) drug therapy: Secondary | ICD-10-CM | POA: Insufficient documentation

## 2014-02-05 DIAGNOSIS — Z21 Asymptomatic human immunodeficiency virus [HIV] infection status: Secondary | ICD-10-CM | POA: Insufficient documentation

## 2014-02-05 DIAGNOSIS — J40 Bronchitis, not specified as acute or chronic: Secondary | ICD-10-CM

## 2014-02-05 DIAGNOSIS — Z8679 Personal history of other diseases of the circulatory system: Secondary | ICD-10-CM | POA: Insufficient documentation

## 2014-02-05 DIAGNOSIS — R5381 Other malaise: Secondary | ICD-10-CM | POA: Insufficient documentation

## 2014-02-05 DIAGNOSIS — Z792 Long term (current) use of antibiotics: Secondary | ICD-10-CM | POA: Insufficient documentation

## 2014-02-05 MED ORDER — OSELTAMIVIR PHOSPHATE 75 MG PO CAPS: 75.0000 mg | ORAL_CAPSULE | Freq: Two times a day (BID) | ORAL | Status: DC

## 2014-02-05 MED ORDER — AMOXICILLIN 500 MG PO CAPS: 500.0000 mg | ORAL_CAPSULE | Freq: Three times a day (TID) | ORAL | Status: DC

## 2014-02-05 NOTE — ED Notes (Addendum)
Pt refuses to have blood work done or to provide a urine sample. States that he only wants the results of a chest xray.

## 2014-02-05 NOTE — ED Notes (Signed)
Pt called to be roomed, no response 

## 2014-02-05 NOTE — ED Notes (Signed)
Patient transported to X-ray 

## 2014-02-05 NOTE — ED Notes (Signed)
MD at bedside. 

## 2014-02-05 NOTE — ED Notes (Signed)
Pt states that he will have the xray done and go from there before we complete any blood work. Dr. Blinda LeatherwoodPollina present.

## 2014-02-05 NOTE — ED Notes (Signed)
PT reports body aches, chills, productive cough with green/yellow sputum, runny nose since yesterday. PT reports he has AIDS cd4 count <200 (taken at [facility] ~5235mo ago).

## 2014-02-05 NOTE — ED Provider Notes (Signed)
CSN: [id number]     Arrival date & time 02/05/14  1016 History   First MD Initiated Contact with Patient 02/05/14 1115     Chief Complaint  Patient presents with  . Influenza  . HIV Positive/AIDS     (Consider location/radiation/quality/duration/timing/severity/associated sxs/prior Treatment) HPI Comments: Patient presents to the ER for evaluation of body aches, chills, runny nose, weakness and cough with green and yellow sputum production which began yesterday. Patient is not short of breath. He reports that he is HIV positive with AIDS (CD4 less than 200 1 month ago at [facility]). He is treated by primary care and infectious disease at [facility].  Patient is a 48 y.o. male presenting with flu symptoms and HIV/AIDS.  Influenza Presenting symptoms: cough and fatigue   Associated symptoms: chills   HIV Positive/AIDS    Past Medical History  Diagnosis Date  . HIV (human immunodeficiency virus infection)   . Hepatitis C   . Asthma   . AIDS   . Cerebral hemorrhage    Past Surgical History  Procedure Laterality Date  . Hernia repair    . Mandible surgery    . Laporotomy     Family History  Problem Relation Age of Onset  . CAD Mother   . Stroke Mother    History  Substance Use Topics  . Smoking status: Current Every Day Smoker -- 2.00 packs/day for 38 years    Types: Cigarettes  . Smokeless tobacco: Never Used  . Alcohol Use: Yes     Comment: occasional    Review of Systems  Constitutional: Positive for chills and fatigue.  Respiratory: Positive for cough.   All other systems reviewed and are negative.      Allergies  Aspirin  Home Medications   Current Outpatient Rx  Name  Route  Sig  Dispense  Refill  . albuterol (PROVENTIL HFA;VENTOLIN HFA) 108 (90 BASE) MCG/ACT inhaler   Inhalation   Inhale 2 puffs into the lungs every 6 (six) hours as needed for wheezing or shortness of breath (cough).   1 Inhaler   0   . clonazePAM (KLONOPIN) 1 MG  tablet   Oral   Take 1 mg by mouth 2 (two) times daily as needed for anxiety.         . Darunavir Ethanolate (PREZISTA) 800 MG tablet   Oral   Take 1 tablet (800 mg total) by mouth daily with breakfast.   30 tablet   0   . diphenhydrAMINE (BENADRYL) 25 mg capsule   Oral   Take 25 mg by mouth daily as needed for allergies.         Marland Kitchen emtricitabine-tenofovir (TRUVADA) 200-300 MG per tablet   Oral   Take 1 tablet by mouth every morning.   30 tablet   0   . ibuprofen (ADVIL,MOTRIN) 200 MG tablet   Oral   Take 800 mg by mouth every 6 (six) hours as needed (for pain).          . Multiple Vitamin (MULTIVITAMIN WITH MINERALS) TABS tablet   Oral   Take 1 tablet by mouth daily.         Marland Kitchen oxyCODONE (OXY IR/ROXICODONE) 5 MG immediate release tablet   Oral   Take 5 mg by mouth every 4 (four) hours as needed for severe pain.         . ritonavir (NORVIR) 100 MG capsule   Oral   Take 1 capsule (100 mg total) by mouth  every morning.   30 capsule   0   . sertraline (ZOLOFT) 50 MG tablet   Oral   Take 50 mg by mouth daily.         Marland Kitchen sulfamethoxazole-trimethoprim (BACTRIM DS) 800-160 MG per tablet   Oral   Take 1 tablet by mouth daily.   30 tablet   0   . amoxicillin (AMOXIL) 500 MG capsule   Oral   Take 1 capsule (500 mg total) by mouth 3 (three) times daily.   30 capsule   0   . oseltamivir (TAMIFLU) 75 MG capsule   Oral   Take 1 capsule (75 mg total) by mouth every 12 (twelve) hours.   10 capsule   0    BP 135/89  Pulse 76  Temp(Src) 98.3 F (36.8 C) (Oral)  Resp 16  Ht 5\' 10"  (1.778 m)  Wt 195 lb (88.451 kg)  BMI 27.98 kg/m2  SpO2 97% Physical Exam  Constitutional: He is oriented to person, place, and time. He appears well-developed and well-nourished. No distress.  HENT:  Head: Normocephalic and atraumatic.  Right Ear: Hearing normal.  Left Ear: Hearing normal.  Nose: Nose normal.  Mouth/Throat: Oropharynx is clear and moist and mucous  membranes are normal.  Eyes: Conjunctivae and EOM are normal. Pupils are equal, round, and reactive to light.  Neck: Normal range of motion. Neck supple.  Cardiovascular: Regular rhythm, S1 normal and S2 normal.  Exam reveals no gallop and no friction rub.   No murmur heard. Pulmonary/Chest: Effort normal and breath sounds normal. No respiratory distress. He exhibits no tenderness.  Abdominal: Soft. Normal appearance and bowel sounds are normal. There is no hepatosplenomegaly. There is no tenderness. There is no rebound, no guarding, no tenderness at McBurney's point and negative Murphy's sign. No hernia.  Musculoskeletal: Normal range of motion.  Neurological: He is alert and oriented to person, place, and time. He has normal strength. No cranial nerve deficit or sensory deficit. Coordination normal. GCS eye subscore is 4. GCS verbal subscore is 5. GCS motor subscore is 6.  Skin: Skin is warm, dry and intact. No rash noted. No cyanosis.  Psychiatric: He has a normal mood and affect. His speech is normal and behavior is normal. Thought content normal.    ED Course  Procedures (including critical care time) Labs Review Labs Reviewed  CULTURE, BLOOD (ROUTINE X 2)  CULTURE, BLOOD (ROUTINE X 2)  URINE CULTURE  CBC WITH DIFFERENTIAL  BASIC METABOLIC PANEL  URINALYSIS, ROUTINE W REFLEX MICROSCOPIC  INFLUENZA PANEL BY PCR (TYPE A & B, H1N1)   Imaging Review Dg Chest 2 View  02/05/2014   CLINICAL DATA:  Fever  EXAM: CHEST  2 VIEW  COMPARISON:  November 16, 2013  FINDINGS: The lungs are clear. Heart size and pulmonary vascularity are normal. No adenopathy. No pneumothorax. No bone lesions.  IMPRESSION: No abnormality noted.   Electronically Signed   By: Bretta Bang M.D.   On: 02/05/2014 12:31     EKG Interpretation None      MDM   Final diagnoses:  Bronchitis    The patient does not appear ill upon examination. His vital signs are normal including oxygenation of 97%. He is in no  respiratory distress and his lungs are clear. Because the patient tells me that he has AIDS, I recommended further workup of the patient including blood work. Patient declines this. Patient reports that he is only here because his employer told him he needs  a work note to miss work. He says that [facility] and will get blood work performed.  I had a long conversation with the patient about the dangers of PCP pneumonia, bacteremia, septicemia. He understands that with his concomitant illness that he can get sick very quickly and even die. He still declines any blood work. I did get him to agree to a chest x-ray which did not show any evidence of pneumonia. As the patient will not allow any further workup I will treat him with Tamiflu and antibiotics. He was counseled to return to the ER immediately if he has any worsening symptoms.    Gilda Creasehristopher J. Timithy Arons, MD 02/05/14 1256

## 2014-02-05 NOTE — Discharge Instructions (Signed)
Bronchitis °Bronchitis is swelling (inflammation) of the air tubes leading to your lungs (bronchi). This causes mucus and a cough. If the swelling gets bad, you may have trouble breathing. °HOME CARE  °· Rest. °· Drink enough fluids to keep your pee (urine) clear or pale yellow (unless you have a condition where you have to watch how much you drink). °· Only take medicine as told by your doctor. If you were given antibiotic medicines, finish them even if you start to feel better. °· Avoid smoke, irritating chemicals, and strong smells. These make the problem worse. Quit smoking if you smoke. This helps your lungs heal faster. °· Use a cool mist humidifier. Change the water in the humidifier every day. You can also sit in the bathroom with hot shower running for 5 10 minutes. Keep the door closed. °· See your health care provider as told. °· Wash your hands often. °GET HELP IF: °Your problems do not get better after 1 week. °GET HELP RIGHT AWAY IF:  °· Your fever gets worse. °· You have chills. °· Your chest hurts. °· Your problems breathing get worse. °· You have blood in your mucus. °· You pass out (faint). °· You feel lightheaded. °· You have a bad headache. °· You throw up (vomit) again and again. °MAKE SURE YOU: °· Understand these instructions. °· Will watch your condition. °· Will get help right away if you are not doing well or get worse. °Document Released: 05/08/2008 Document Revised: 09/10/2013 Document Reviewed: 07/15/2013 °ExitCare® Patient Information ©2014 [employer]. ° °

## 2014-03-05 ENCOUNTER — Emergency Department (HOSPITAL_COMMUNITY)
Admission: EM | Admit: 2014-03-05 | Discharge: 2014-03-05 | Discharge status: Against Medical Advice | Payer: Self-pay | Attending: Emergency Medicine | Admitting: Emergency Medicine

## 2014-03-05 ENCOUNTER — Encounter (HOSPITAL_COMMUNITY): Payer: Self-pay | Admitting: Emergency Medicine

## 2014-03-05 DIAGNOSIS — F1010 Alcohol abuse, uncomplicated: Secondary | ICD-10-CM | POA: Insufficient documentation

## 2014-03-05 DIAGNOSIS — R52 Pain, unspecified: Secondary | ICD-10-CM | POA: Insufficient documentation

## 2014-03-05 DIAGNOSIS — Z8619 Personal history of other infectious and parasitic diseases: Secondary | ICD-10-CM | POA: Insufficient documentation

## 2014-03-05 DIAGNOSIS — B20 Human immunodeficiency virus [HIV] disease: Secondary | ICD-10-CM | POA: Insufficient documentation

## 2014-03-05 DIAGNOSIS — F17200 Nicotine dependence, unspecified, uncomplicated: Secondary | ICD-10-CM | POA: Insufficient documentation

## 2014-03-05 DIAGNOSIS — J45909 Unspecified asthma, uncomplicated: Secondary | ICD-10-CM | POA: Insufficient documentation

## 2014-03-05 DIAGNOSIS — G8929 Other chronic pain: Secondary | ICD-10-CM | POA: Insufficient documentation

## 2014-03-05 DIAGNOSIS — Z8669 Personal history of other diseases of the nervous system and sense organs: Secondary | ICD-10-CM | POA: Insufficient documentation

## 2014-03-05 DIAGNOSIS — F10929 Alcohol use, unspecified with intoxication, unspecified: Secondary | ICD-10-CM

## 2014-03-05 DIAGNOSIS — Z79899 Other long term (current) drug therapy: Secondary | ICD-10-CM | POA: Insufficient documentation

## 2014-03-05 DIAGNOSIS — Z792 Long term (current) use of antibiotics: Secondary | ICD-10-CM | POA: Insufficient documentation

## 2014-03-05 NOTE — ED Notes (Signed)
Pt ambulated out of department prior to discharge.

## 2014-03-05 NOTE — ED Notes (Signed)
Pt arrived to the ED with a complaint of generalized pain.  Pt states he has chronic pain.  Pt states that he has been given a appointment to the pain clinic but is unclear that he has gone to the appointment.

## 2014-03-05 NOTE — ED Provider Notes (Signed)
CSN: [id number]     Arrival date & time 03/05/14  0518 History   First MD Initiated Contact with Patient 03/05/14 [id number]     Chief Complaint  Patient presents with  . Extremity Pain     (Consider location/radiation/quality/duration/timing/severity/associated sxs/prior Treatment) HPI Comments: Patient with history of HIV/AIDS, chronic generalized pain -- presents with complaint of worsening of generalized pain. No other symptoms including fever, joint swelling or redness. Pain is the same as his typical chronic pain. [facility] is [facility] at [facility] by infectious disease. They have referred him to pain management clinic here in [place]. Patient admits to drinking 6 beers overnight. He is here requesting a dose of Percocet and Klonopin until he gets to his pain management appointment which he states is tomorrow. No other medical complaints. Patient states that the pain is worse in his wrists, elbows, and knees. The onset of this condition was insidious. The course is constant. Aggravating factors: movement. Alleviating factors: none.    Patient is a 48 y.o. male presenting with extremity pain. The history is provided by the patient and medical records.  Extremity Pain Associated symptoms include arthralgias and myalgias. Pertinent negatives include no joint swelling, neck pain, numbness or weakness.    Past Medical History  Diagnosis Date  . HIV (human immunodeficiency virus infection)   . Hepatitis C   . Asthma   . AIDS   . Cerebral hemorrhage    Past Surgical History  Procedure Laterality Date  . Hernia repair    . Mandible surgery    . Laporotomy     Family History  Problem Relation Age of Onset  . CAD Mother   . Stroke Mother    History  Substance Use Topics  . Smoking status: Current Every Day Smoker -- 2.00 packs/day for 38 years    Types: Cigarettes  . Smokeless tobacco: Never Used  . Alcohol Use: Yes     Comment: occasional    Review of Systems  Constitutional: Negative  for activity change.  Musculoskeletal: Positive for arthralgias and myalgias. Negative for back pain, gait problem, joint swelling and neck pain.  Skin: Negative for wound.  Neurological: Negative for weakness and numbness.      Allergies  Aspirin  Home Medications   Current Outpatient Rx  Name  Route  Sig  Dispense  Refill  . albuterol (PROVENTIL HFA;VENTOLIN HFA) 108 (90 BASE) MCG/ACT inhaler   Inhalation   Inhale 2 puffs into the lungs every 6 (six) hours as needed for wheezing or shortness of breath (cough).   1 Inhaler   0   . clonazePAM (KLONOPIN) 1 MG tablet   Oral   Take 1 mg by mouth 2 (two) times daily as needed for anxiety.         . Darunavir Ethanolate (PREZISTA) 800 MG tablet   Oral   Take 1 tablet (800 mg total) by mouth daily with breakfast.   30 tablet   0   . emtricitabine-tenofovir (TRUVADA) 200-300 MG per tablet   Oral   Take 1 tablet by mouth every morning.   30 tablet   0   . fexofenadine (ALLEGRA) 180 MG tablet   Oral   Take 180 mg by mouth daily.         . hydroxypropyl methylcellulose (ISOPTO TEARS) 2.5 % ophthalmic solution   Both Eyes   Place 1 drop into both eyes 3 (three) times daily as needed for dry eyes.         .Marland Kitchen  Multiple Vitamin (MULTIVITAMIN WITH MINERALS) TABS tablet   Oral   Take 1 tablet by mouth daily.         Marland Kitchen oxyCODONE (OXY IR/ROXICODONE) 5 MG immediate release tablet   Oral   Take 5 mg by mouth every 4 (four) hours as needed for severe pain.         . ritonavir (NORVIR) 100 MG capsule   Oral   Take 1 capsule (100 mg total) by mouth every morning.   30 capsule   0   . sertraline (ZOLOFT) 50 MG tablet   Oral   Take 50 mg by mouth daily.         Marland Kitchen sulfamethoxazole-trimethoprim (BACTRIM DS) 800-160 MG per tablet   Oral   Take 1 tablet by mouth daily.   30 tablet   0   . ibuprofen (ADVIL,MOTRIN) 200 MG tablet   Oral   Take 800 mg by mouth every 6 (six) hours as needed (for pain).           BP  137/89  Pulse 66  Temp(Src) 97.3 F (36.3 C) (Oral)  Resp 16  SpO2 99% Physical Exam  Nursing note and vitals reviewed. Constitutional: He appears well-developed and well-nourished.  Odor of alcohol  HENT:  Head: Normocephalic and atraumatic.  Eyes: Conjunctivae are normal. Right eye exhibits no discharge. Left eye exhibits no discharge.  Neck: Normal range of motion. Neck supple.  Cardiovascular: Normal rate, regular rhythm and normal heart sounds.   Pulmonary/Chest: Effort normal and breath sounds normal.  Abdominal: Soft. There is no tenderness.  Musculoskeletal:       Right shoulder: Normal.       Right elbow: He exhibits normal range of motion and no swelling. Tenderness (general) found.       Left elbow: He exhibits normal range of motion and no swelling. Tenderness (general) found.       Right wrist: He exhibits tenderness. He exhibits normal range of motion and no swelling.       Left wrist: He exhibits tenderness. He exhibits normal range of motion.       Right hip: Normal.       Left hip: Normal.       Right knee: He exhibits normal range of motion, no swelling and no effusion. Tenderness found.       Left knee: He exhibits no swelling, no effusion and no ecchymosis. Tenderness found.  Neurological: He is alert. No sensory deficit. He exhibits normal muscle tone. Coordination and gait normal.  Mildly slurred speech  Skin: Skin is warm and dry.  Psychiatric: He has a normal mood and affect.    ED Course  Procedures (including critical care time) Labs Review Labs Reviewed - No data to display Imaging Review No results found.   EKG Interpretation None      6:15 AM Patient seen and examined.    Vital signs reviewed and are as follows: Filed Vitals:   03/05/14 0527  BP: 137/89  Pulse: 66  Temp: 97.3 F (36.3 C)  Resp: 16   Reviewed patient's [facility] records that corroborates that he is working on getting appointment at [facility] here in  [place].    Patient is clinically intoxicated, but ambulates well. Odor of alcohol on breath. Mild slurred speech. He is requesting Percocet and Klonopin. I explained that I cannot provide these in his current state, citing risk for respiratory depression if these medications are combined with alcohol. Patient refuted this  and requested Percocet only. I again declined.   Patient became upset and left the Emergency Department without paperwork.    MDM   Final diagnoses:  Alcohol intoxication  Chronic pain   Patient with chronic pain. Joint pain and myalgias without objective findings regarding these. He is intoxicated this morning. I refused his request for narcotics/benzos based on this. Patient is ambulatory without difficulty, although slurring speech and smells of EtOH. Do not feel he is a danger to himself to leave.      Renne Crigler, PA-C 03/05/14 0721  Renne Crigler, PA-C 03/05/14 [phone]

## 2014-03-06 NOTE — ED Provider Notes (Signed)
Medical screening examination/treatment/procedure(s) were performed by non-physician practitioner and as supervising physician I was immediately available for consultation/collaboration.   Sunnie NielsenBrian Evangelia Whitaker, MD 03/06/14 2303

## 2014-03-07 ENCOUNTER — Emergency Department (HOSPITAL_COMMUNITY): Payer: Self-pay

## 2014-03-07 ENCOUNTER — Encounter (HOSPITAL_COMMUNITY): Payer: Self-pay | Admitting: Emergency Medicine

## 2014-03-07 ENCOUNTER — Emergency Department (HOSPITAL_COMMUNITY)
Admission: EM | Admit: 2014-03-07 | Discharge: 2014-03-07 | Disposition: A | Discharge status: Home / Self Care | Payer: Self-pay | Attending: Emergency Medicine | Admitting: Emergency Medicine

## 2014-03-07 DIAGNOSIS — Z8679 Personal history of other diseases of the circulatory system: Secondary | ICD-10-CM | POA: Insufficient documentation

## 2014-03-07 DIAGNOSIS — R109 Unspecified abdominal pain: Secondary | ICD-10-CM | POA: Insufficient documentation

## 2014-03-07 DIAGNOSIS — Z792 Long term (current) use of antibiotics: Secondary | ICD-10-CM | POA: Insufficient documentation

## 2014-03-07 DIAGNOSIS — F17200 Nicotine dependence, unspecified, uncomplicated: Secondary | ICD-10-CM | POA: Insufficient documentation

## 2014-03-07 DIAGNOSIS — R339 Retention of urine, unspecified: Secondary | ICD-10-CM | POA: Insufficient documentation

## 2014-03-07 DIAGNOSIS — Z79899 Other long term (current) drug therapy: Secondary | ICD-10-CM | POA: Insufficient documentation

## 2014-03-07 DIAGNOSIS — Z21 Asymptomatic human immunodeficiency virus [HIV] infection status: Secondary | ICD-10-CM | POA: Insufficient documentation

## 2014-03-07 DIAGNOSIS — G8929 Other chronic pain: Secondary | ICD-10-CM | POA: Insufficient documentation

## 2014-03-07 DIAGNOSIS — J45909 Unspecified asthma, uncomplicated: Secondary | ICD-10-CM | POA: Insufficient documentation

## 2014-03-07 DIAGNOSIS — Z8619 Personal history of other infectious and parasitic diseases: Secondary | ICD-10-CM | POA: Insufficient documentation

## 2014-03-07 NOTE — ED Notes (Addendum)
Per EMS, Pt has been feeling SOB x 2days. Pt also has not been able to urinate x 2days, he stated "only a trickle" of urine when trying to urinate. Pt took his inhaler prior to EMS arrival. Pt denies SOB at this time. Pt reports drinking 6 beers tonight.  BP:140/108 HR: 76 RR:18 SpO2: 98%

## 2014-03-07 NOTE — Discharge Instructions (Signed)

## 2014-03-07 NOTE — ED Provider Notes (Signed)
CSN: [id number]     Arrival date & time 03/07/14  0404 History   First MD Initiated Contact with Patient 03/07/14 0434     Chief Complaint  Patient presents with  . Shortness of Breath  . Urinary Retention     (Consider location/radiation/quality/duration/timing/severity/associated sxs/prior Treatment) HPI History provided by patient. History of HIV and chronic pain, presents with 3 days of left flank pain and decreased urination. This and states he is worried that his kidneys are shutting down or that he may have a kidney stone. He is taking OxyContin without relief of the symptoms. He is followed by infectious disease at [facility]. Pain is sharp in quality, severe at times, no hematuria. No fevers or chills. No abdominal pain otherwise.  Past Medical History  Diagnosis Date  . HIV (human immunodeficiency virus infection)   . Hepatitis C   . Asthma   . AIDS   . Cerebral hemorrhage    Past Surgical History  Procedure Laterality Date  . Hernia repair    . Mandible surgery    . Laporotomy     Family History  Problem Relation Age of Onset  . CAD Mother   . Stroke Mother    History  Substance Use Topics  . Smoking status: Current Every Day Smoker -- 2.00 packs/day for 38 years    Types: Cigarettes  . Smokeless tobacco: Never Used  . Alcohol Use: Yes     Comment: occasional    Review of Systems  Constitutional: Negative for fever and chills.  Respiratory: Negative for shortness of breath.   Cardiovascular: Negative for chest pain.  Gastrointestinal: Negative for vomiting and abdominal pain.  Genitourinary: Positive for flank pain. Negative for dysuria.  Musculoskeletal: Negative for back pain.  Skin: Negative for rash.  Neurological: Negative for headaches.  All other systems reviewed and are negative.      Allergies  Aspirin  Home Medications   Current Outpatient Rx  Name  Route  Sig  Dispense  Refill  . albuterol (PROVENTIL HFA;VENTOLIN HFA) 108 (90  BASE) MCG/ACT inhaler   Inhalation   Inhale 2 puffs into the lungs every 6 (six) hours as needed for wheezing or shortness of breath (cough).   1 Inhaler   0   . clonazePAM (KLONOPIN) 1 MG tablet   Oral   Take 1 mg by mouth 2 (two) times daily as needed for anxiety.         . Darunavir Ethanolate (PREZISTA) 800 MG tablet   Oral   Take 1 tablet (800 mg total) by mouth daily with breakfast.   30 tablet   0   . emtricitabine-tenofovir (TRUVADA) 200-300 MG per tablet   Oral   Take 1 tablet by mouth every morning.   30 tablet   0   . fexofenadine (ALLEGRA) 180 MG tablet   Oral   Take 180 mg by mouth daily.         . hydroxypropyl methylcellulose (ISOPTO TEARS) 2.5 % ophthalmic solution   Both Eyes   Place 1 drop into both eyes 3 (three) times daily as needed for dry eyes.         Marland Kitchen ibuprofen (ADVIL,MOTRIN) 200 MG tablet   Oral   Take 800 mg by mouth every 6 (six) hours as needed (for pain).          . Multiple Vitamin (MULTIVITAMIN WITH MINERALS) TABS tablet   Oral   Take 1 tablet by mouth daily.         Marland Kitchen  oxyCODONE (OXY IR/ROXICODONE) 5 MG immediate release tablet   Oral   Take 5 mg by mouth every 4 (four) hours as needed for severe pain.         . ritonavir (NORVIR) 100 MG capsule   Oral   Take 1 capsule (100 mg total) by mouth every morning.   30 capsule   0   . sertraline (ZOLOFT) 50 MG tablet   Oral   Take 50 mg by mouth daily.         Marland Kitchen. sulfamethoxazole-trimethoprim (BACTRIM DS) 800-160 MG per tablet   Oral   Take 1 tablet by mouth daily.   30 tablet   0    BP 136/90  Pulse 65  Temp(Src) 98.7 F (37.1 C) (Oral)  Resp 18  SpO2 99% Physical Exam  Constitutional: He is oriented to person, place, and time. He appears well-developed and well-nourished.  HENT:  Head: Normocephalic and atraumatic.  Eyes: EOM are normal. Pupils are equal, round, and reactive to light.  Neck: Neck supple.  Cardiovascular: Normal rate, regular rhythm and  intact distal pulses.   Pulmonary/Chest: Effort normal and breath sounds normal. No respiratory distress.  Abdominal: Soft. Bowel sounds are normal. He exhibits no distension. There is no rebound and no guarding.  Some tenderness over left flank. No abdominal tenderness otherwise. no acute abdomen  Musculoskeletal: Normal range of motion. He exhibits no edema.  Neurological: He is alert and oriented to person, place, and time.  Skin: Skin is warm and dry.    ED Course  Procedures (including critical care time) Labs Review Labs Reviewed - No data to display Imaging Review No results found.  After evaluation, plan labs and imaging. EMR reviewed, patient has had multiple recent ER visits and after questioning patient about this, he became upset and declined blood work and imaging.  He is requesting narcotic pain medications and also requesting a work note for the next week - states he is unable to work to generalized weakness and ongoing pain. Given chronic complaints and patient declining outpatient followup, I am holding narcotics and not providing an extended work note.   Patient left AMA, declines paperwork, agrees to return precautions  MDM   Diagnosis: Abdominal pain  Patient with drug-seeking behavior, declines workup, left AMA    Sunnie NielsenBrian Taino Maertens, MD 03/09/14 [phone]

## 2014-03-07 NOTE — ED Notes (Signed)
Pt refused to have phlebotomist draw blood. Stated, "I can draw my own damn blood".

## 2014-03-25 ENCOUNTER — Encounter (HOSPITAL_COMMUNITY): Payer: Self-pay | Admitting: Emergency Medicine

## 2014-03-25 ENCOUNTER — Emergency Department (HOSPITAL_COMMUNITY)
Admission: EM | Admit: 2014-03-25 | Discharge: 2014-03-25 | Discharge status: Against Medical Advice | Payer: Self-pay | Attending: Emergency Medicine | Admitting: Emergency Medicine

## 2014-03-25 DIAGNOSIS — Z7729 Contact with and (suspected ) exposure to other hazardous substances: Secondary | ICD-10-CM | POA: Insufficient documentation

## 2014-03-25 DIAGNOSIS — H579 Unspecified disorder of eye and adnexa: Secondary | ICD-10-CM

## 2014-03-25 DIAGNOSIS — Z79899 Other long term (current) drug therapy: Secondary | ICD-10-CM | POA: Insufficient documentation

## 2014-03-25 DIAGNOSIS — J45909 Unspecified asthma, uncomplicated: Secondary | ICD-10-CM | POA: Insufficient documentation

## 2014-03-25 DIAGNOSIS — R4789 Other speech disturbances: Secondary | ICD-10-CM | POA: Insufficient documentation

## 2014-03-25 DIAGNOSIS — H5710 Ocular pain, unspecified eye: Secondary | ICD-10-CM | POA: Insufficient documentation

## 2014-03-25 DIAGNOSIS — Z8679 Personal history of other diseases of the circulatory system: Secondary | ICD-10-CM | POA: Insufficient documentation

## 2014-03-25 DIAGNOSIS — B20 Human immunodeficiency virus [HIV] disease: Secondary | ICD-10-CM | POA: Insufficient documentation

## 2014-03-25 DIAGNOSIS — Z8619 Personal history of other infectious and parasitic diseases: Secondary | ICD-10-CM | POA: Insufficient documentation

## 2014-03-25 DIAGNOSIS — F17200 Nicotine dependence, unspecified, uncomplicated: Secondary | ICD-10-CM | POA: Insufficient documentation

## 2014-03-25 NOTE — ED Provider Notes (Signed)
CSN: [id number]     Arrival date & time 03/25/14  [id number] History  This chart was scribed for non-physician practitioner working with Steven BeckKaitlyn Layani [name], by Steven [name] ED Scribe. This patient was seen in TR04C/TR04C and the patient's care was started at 9:18 AM.    Chief Complaint  Patient presents with  . Eye Injury     Patient is a 48 y.o. male presenting with eye injury. The history is provided by the patient. No language interpreter was used.  Eye Injury     HPI Comments: Steven [name] is a 48 y.o. male who presents to the Emergency Department complaining that he cannot see due to having superglue in his eyes, he had to get up early and the kids living with him had replaced his eyes drops with superglue. He states that this happened around 4am this morning and that he immediately came to the ED.  He also states having an allergic reaction to [facility], he accidentally put too much on his tooth brush this morning, "because he had to get up so early and accidentally picked it up". Pt also states his mother was killed in the same way.  Pt wants pain medication today to treat his chronic pain.  Pt was able to see at the time of the exam.   Past Medical History  Diagnosis Date  . HIV (human immunodeficiency virus infection)   . Hepatitis C   . Asthma   . AIDS   . Cerebral hemorrhage    Past Surgical History  Procedure Laterality Date  . Hernia repair    . Mandible surgery    . Laporotomy     Family History  Problem Relation Age of Onset  . CAD Mother   . Stroke Mother    History  Substance Use Topics  . Smoking status: Current Every Day Smoker -- 2.00 packs/day for 38 years    Types: Cigarettes  . Smokeless tobacco: Never Used  . Alcohol Use: Yes     Comment: occasional    Review of Systems  Eyes: Positive for pain ("has superglue in his eyes").  All other systems reviewed and are negative.     Allergies  Aspirin  Home Medications   Prior to Admission  medications   Medication Sig Start Date End Date Taking? Authorizing Provider  albuterol (PROVENTIL HFA;VENTOLIN HFA) 108 (90 BASE) MCG/ACT inhaler Inhale 2 puffs into the lungs every 6 (six) hours as needed for wheezing or shortness of breath (cough). 11/17/13   Arman FilterGail K Schulz, NP  clonazePAM (KLONOPIN) 1 MG tablet Take 1 mg by mouth 2 (two) times daily as needed for anxiety.    Historical Provider, MD  Darunavir Ethanolate (PREZISTA) 800 MG tablet Take 1 tablet (800 mg total) by mouth daily with breakfast. 11/17/13   Arman FilterGail K Schulz, NP  emtricitabine-tenofovir (TRUVADA) 200-300 MG per tablet Take 1 tablet by mouth every morning. 11/17/13   Arman FilterGail K Schulz, NP  fexofenadine (ALLEGRA) 180 MG tablet Take 180 mg by mouth daily.    Historical Provider, MD  hydroxypropyl methylcellulose (ISOPTO TEARS) 2.5 % ophthalmic solution Place 1 drop into both eyes 3 (three) times daily as needed for dry eyes.    Historical Provider, MD  ibuprofen (ADVIL,MOTRIN) 200 MG tablet Take 800 mg by mouth every 6 (six) hours as needed (for pain).     Historical Provider, MD  Multiple Vitamin (MULTIVITAMIN WITH MINERALS) TABS tablet Take 1 tablet by mouth daily.    Historical Provider, MD  oxyCODONE (OXY IR/ROXICODONE) 5 MG immediate release tablet Take 5 mg by mouth every 4 (four) hours as needed for severe pain.    Historical Provider, MD  ritonavir (NORVIR) 100 MG capsule Take 1 capsule (100 mg total) by mouth every morning. 11/17/13   Arman FilterGail K Schulz, NP  sertraline (ZOLOFT) 50 MG tablet Take 50 mg by mouth daily.    Historical Provider, MD  sulfamethoxazole-trimethoprim (BACTRIM DS) 800-160 MG per tablet Take 1 tablet by mouth daily. 11/17/13   Arman FilterGail K Schulz, NP   BP 152/62  Pulse 93  Temp(Src) 97.8 F (36.6 C) (Oral)  SpO2 98% Physical Exam  Nursing note and vitals reviewed. Constitutional: He is oriented to person, place, and time. He appears well-developed and well-nourished.  HENT:  Head: Normocephalic and  atraumatic.  Eyes: Pupils are equal, round, and reactive to light.  Both eyes are open No superglue noted to eyelids. Pt tracks appropriately.  Neck: Normal range of motion.  Musculoskeletal: Normal range of motion.  Neurological: He is alert and oriented to person, place, and time.  Psychiatric: His speech is slurred.    ED Course  Procedures (including critical care time) DIAGNOSTIC STUDIES: Oxygen Saturation is 98% on RA, normal by my interpretation.    COORDINATION OF CARE:   9:23 AM-Discussed treatment plan which includes discussing his pain management, pt left  with pt at bedside and pt agreed to plan.   Labs Review Labs Reviewed - No data to display  Imaging Review No results found.   EKG Interpretation None      MDM   Final diagnoses:  Bilateral eye complaint   I personally performed the services described in this documentation, which was scribed in my presence. The recorded information has been reviewed and is accurate.    Patient complains of superglue in his eyes although his eyes are both wide open and he is tracking without difficulty. Patient is requesting narcotic prescriptions here which I will not provide. Vitals stable and patient afebrile.     Steven BeckKaitlyn Fredonia Casalino, PA-C 03/25/14 1043

## 2014-03-25 NOTE — ED Notes (Signed)
Pt ED per [facility] for c/o "superglue in eye" by accident. Pt appears manic.

## 2014-03-25 NOTE — ED Provider Notes (Signed)
Medical screening examination/treatment/procedure(s) were performed by non-physician practitioner and as supervising physician I was immediately available for consultation/collaboration.   EKG Interpretation None        Laray AngerKathleen M Maxtyn Nuzum, DO 03/25/14 1931

## 2014-03-25 NOTE — ED Notes (Signed)
Pt told EMS he couldn't see but has eyes open and is able to read papers in his hand.

## 2014-03-25 NOTE — ED Notes (Signed)
Pt became angry with PA because she would not give him Lortab, so left.

## 2014-04-22 ENCOUNTER — Emergency Department (HOSPITAL_COMMUNITY)
Admission: EM | Admit: 2014-04-22 | Discharge: 2014-04-22 | Discharge status: Against Medical Advice | Payer: Self-pay | Attending: Emergency Medicine | Admitting: Emergency Medicine

## 2014-04-22 ENCOUNTER — Emergency Department (HOSPITAL_COMMUNITY): Payer: Self-pay

## 2014-04-22 ENCOUNTER — Encounter (HOSPITAL_COMMUNITY): Payer: Self-pay | Admitting: Emergency Medicine

## 2014-04-22 DIAGNOSIS — Z79899 Other long term (current) drug therapy: Secondary | ICD-10-CM | POA: Insufficient documentation

## 2014-04-22 DIAGNOSIS — R5381 Other malaise: Secondary | ICD-10-CM | POA: Insufficient documentation

## 2014-04-22 DIAGNOSIS — Z8679 Personal history of other diseases of the circulatory system: Secondary | ICD-10-CM | POA: Insufficient documentation

## 2014-04-22 DIAGNOSIS — R5383 Other fatigue: Principal | ICD-10-CM

## 2014-04-22 DIAGNOSIS — B20 Human immunodeficiency virus [HIV] disease: Secondary | ICD-10-CM | POA: Insufficient documentation

## 2014-04-22 DIAGNOSIS — J45909 Unspecified asthma, uncomplicated: Secondary | ICD-10-CM | POA: Insufficient documentation

## 2014-04-22 DIAGNOSIS — Z792 Long term (current) use of antibiotics: Secondary | ICD-10-CM | POA: Insufficient documentation

## 2014-04-22 DIAGNOSIS — R531 Weakness: Secondary | ICD-10-CM

## 2014-04-22 DIAGNOSIS — F17200 Nicotine dependence, unspecified, uncomplicated: Secondary | ICD-10-CM | POA: Insufficient documentation

## 2014-04-22 DIAGNOSIS — Z8619 Personal history of other infectious and parasitic diseases: Secondary | ICD-10-CM | POA: Insufficient documentation

## 2014-04-22 LAB — COMPREHENSIVE METABOLIC PANEL
ALK PHOS: 113 U/L (ref 39–117)
ALT: 103 U/L — ABNORMAL HIGH (ref 0–53)
AST: 155 U/L — ABNORMAL HIGH (ref 0–37)
Albumin: 3.4 g/dL — ABNORMAL LOW (ref 3.5–5.2)
BUN: 16 mg/dL (ref 6–23)
CHLORIDE: 100 meq/L (ref 96–112)
CO2: 21 mEq/L (ref 19–32)
Calcium: 8.5 mg/dL (ref 8.4–10.5)
Creatinine, Ser: 1.02 mg/dL (ref 0.50–1.35)
GFR, EST NON AFRICAN AMERICAN: 84 mL/min — AB (ref >90)
GLUCOSE: 95 mg/dL (ref 70–99)
POTASSIUM: 3.7 meq/L (ref 3.7–5.3)
Sodium: 136 mEq/L — ABNORMAL LOW (ref 137–147)
Total Bilirubin: 0.9 mg/dL (ref 0.3–1.2)
Total Protein: 7.4 g/dL (ref 6.0–8.3)

## 2014-04-22 LAB — CBC
HCT: 33.5 % — ABNORMAL LOW (ref 39.0–52.0)
HEMOGLOBIN: 11.8 g/dL — AB (ref 13.0–17.0)
MCH: 33.8 pg (ref 26.0–34.0)
MCHC: 35.2 g/dL (ref 30.0–36.0)
MCV: 96 fL (ref 78.0–100.0)
Platelets: 81 10*3/uL — ABNORMAL LOW (ref 150–400)
RBC: 3.49 MIL/uL — AB (ref 4.22–5.81)
RDW: 13.6 % (ref 11.5–15.5)
WBC: 1.8 10*3/uL — ABNORMAL LOW (ref 4.0–10.5)

## 2014-04-22 LAB — PROTIME-INR
INR: 1.07 (ref 0.00–1.49)
Prothrombin Time: 13.7 seconds (ref 11.6–15.2)

## 2014-04-22 LAB — DIFFERENTIAL
BASOS PCT: 2 % — AB (ref 0–1)
Basophils Absolute: 0 10*3/uL (ref 0.0–0.1)
EOS PCT: 4 % (ref 0–5)
Eosinophils Absolute: 0.1 10*3/uL (ref 0.0–0.7)
Lymphocytes Relative: 34 % (ref 12–46)
Lymphs Abs: 0.6 10*3/uL — ABNORMAL LOW (ref 0.7–4.0)
MONOS PCT: 13 % — AB (ref 3–12)
Monocytes Absolute: 0.2 10*3/uL (ref 0.1–1.0)
NEUTROS PCT: 47 % (ref 43–77)
Neutro Abs: 0.9 10*3/uL — ABNORMAL LOW (ref 1.7–7.7)

## 2014-04-22 LAB — I-STAT CHEM 8, ED
BUN: 15 mg/dL (ref 6–23)
CALCIUM ION: 1.15 mmol/L (ref 1.12–1.23)
CHLORIDE: 98 meq/L (ref 96–112)
Creatinine, Ser: 1.1 mg/dL (ref 0.50–1.35)
GLUCOSE: 95 mg/dL (ref 70–99)
HEMATOCRIT: 39 % (ref 39.0–52.0)
Hemoglobin: 13.3 g/dL (ref 13.0–17.0)
Potassium: 3.5 mEq/L — ABNORMAL LOW (ref 3.7–5.3)
Sodium: 139 mEq/L (ref 137–147)
TCO2: 24 mmol/L (ref 0–100)

## 2014-04-22 LAB — I-STAT TROPONIN, ED: Troponin i, poc: 0 ng/mL (ref 0.00–0.08)

## 2014-04-22 LAB — APTT: aPTT: 32 seconds (ref 24–37)

## 2014-04-22 LAB — ETHANOL: Alcohol, Ethyl (B): 24 mg/dL — ABNORMAL HIGH (ref 0–11)

## 2014-04-22 MED ORDER — MORPHINE SULFATE 4 MG/ML IJ SOLN
4.0000 mg | Freq: Once | INTRAMUSCULAR | Status: AC
  Administered 2014-04-22: 4 mg via INTRAVENOUS
  Filled 2014-04-22: qty 1

## 2014-04-22 NOTE — Discharge Instructions (Signed)
Weakness  Weakness is a lack of strength. It may be felt all over the body (generalized) or in one specific part of the body (focal). Some causes of weakness can be serious. You may need further medical evaluation, especially if you are elderly or you have a history of immunosuppression (such as chemotherapy or HIV), kidney disease, heart disease, or diabetes.  CAUSES   Weakness can be caused by many different things, including:  · Infection.  · Physical exhaustion.  · Internal bleeding or other blood loss that results in a lack of red blood cells (anemia).  · Dehydration. This cause is more common in elderly people.  · Side effects or electrolyte abnormalities from medicines, such as pain medicines or sedatives.  · Emotional distress, anxiety, or depression.  · Circulation problems, especially severe peripheral arterial disease.  · Heart disease, such as rapid atrial fibrillation, bradycardia, or heart failure.  · Nervous system disorders, such as Guillain-Barré syndrome, multiple sclerosis, or stroke.  DIAGNOSIS   To find the cause of your weakness, your caregiver will take your history and perform a physical exam. Lab tests or X-rays may also be ordered, if needed.  TREATMENT   Treatment of weakness depends on the cause of your symptoms and can vary greatly.  HOME CARE INSTRUCTIONS   · Rest as needed.  · Eat a well-balanced diet.  · Try to get some exercise every day.  · Only take over-the-counter or prescription medicines as directed by your caregiver.  SEEK MEDICAL CARE IF:   · Your weakness seems to be getting worse or spreads to other parts of your body.  · You develop new aches or pains.  SEEK IMMEDIATE MEDICAL CARE IF:   · You cannot perform your normal daily activities, such as getting dressed and feeding yourself.  · You cannot walk up and down stairs, or you feel exhausted when you do so.  · You have shortness of breath or chest pain.  · You have difficulty moving parts of your body.  · You have weakness  in only one area of the body or on only one side of the body.  · You have a fever.  · You have trouble speaking or swallowing.  · You cannot control your bladder or bowel movements.  · You have black or bloody vomit or stools.  MAKE SURE YOU:  · Understand these instructions.  · Will watch your condition.  · Will get help right away if you are not doing well or get worse.  Document Released: 11/20/2005 Document Revised: 05/21/2012 Document Reviewed: 01/19/2012  [employer]® Patient Information ©2014 [employer].

## 2014-04-22 NOTE — ED Notes (Signed)
Patient transported to CT 

## 2014-04-22 NOTE — ED Notes (Signed)
Patient was refusing to complete his exam and evaluation in the ER. He received his Morphine and was not going to stay. No prescriptions were prescribed and patient refused to do vital signs, swallow screen or urinate. PA made aware and patient was discharge AMA. Patient was made aware of the consequences of going AMA and what the potential threats are. Patient understood and stated all his symptoms were gone.

## 2014-04-22 NOTE — ED Notes (Signed)
Pt here by ems, reports left hand is not functioning like it should, sts unable to grip and lift arm as normal, pt is able to move arm but certain tasks are harder, onset yesterday after work and gotten worse. Had weak and dizzy spell this morning at 0430. Walked from apt to gas station this am and he thought his gait was off.

## 2014-04-22 NOTE — ED Notes (Signed)
Patient refuses to use call light that is at his side. He keeps pulling all his wires off and demands his pain medication so he can catch the bus. Informed the patient he would have to wait for 15 min before he can leave. He stated he would wait. Patient unable to urinate and refuses to have a swallow screen done.

## 2014-04-22 NOTE — ED Provider Notes (Signed)
  This was a shared visit with a mid-level provided (NP or PA).  Throughout the patient's course I was available for consultation/collaboration.  I saw the ECG (if appropriate), relevant labs and studies - I agree with the interpretation.  On my exam the patient was in no distress.  He appeared anxious, was requesting pain medication, then prompt discharge.      Gerhard Munchobert Jaquaveon Bilal, MD 04/22/14 [phone]

## 2014-04-22 NOTE — ED Provider Notes (Signed)
CSN: [id number]     Arrival date & time 04/22/14  [id number] History   First MD Initiated Contact with Patient 04/22/14 [phone]     Chief Complaint  Patient presents with  . Weakness     (Consider location/radiation/quality/duration/timing/severity/associated sxs/prior Treatment) HPI Comments: Patient presents emergency department with chief complaint of weakness. He states that he woke in the middle of the night in his kitchen, and had fallen on his table. He is uncertain if he passed out, but does not recall what happened. He states that since awakening around 3 AM, he has felt weak and is left upper extremity. He states that this is new. He was last normal last night. 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 He has regular followup. His next appointment on Friday.  The history is provided by the patient. No language interpreter was used.    Past Medical History  Diagnosis Date  . HIV (human immunodeficiency virus infection)   . Hepatitis C   . Asthma   . AIDS   . Cerebral hemorrhage    Past Surgical History  Procedure Laterality Date  . Hernia repair    . Mandible surgery    . Laporotomy     Family History  Problem Relation Age of Onset  . CAD Mother   . Stroke Mother    History  Substance Use Topics  . Smoking status: Current Every Day Smoker -- 2.00 packs/day for 38 years    Types: Cigarettes  . Smokeless tobacco: Never Used  . Alcohol Use: Yes     Comment: occasional    Review of Systems  Constitutional: Negative for fever and chills.  Respiratory: Negative for shortness of breath.   Cardiovascular: Negative for chest pain.  Gastrointestinal: Negative for nausea, vomiting, diarrhea and constipation.  Genitourinary: Negative for dysuria.  Neurological: Positive for weakness.      Allergies  Aspirin  Home Medications   Prior to Admission medications   Medication Sig Start Date End Date Taking? Authorizing Provider  albuterol (PROVENTIL  HFA;VENTOLIN HFA) 108 (90 BASE) MCG/ACT inhaler Inhale 2 puffs into the lungs every 6 (six) hours as needed for wheezing or shortness of breath (cough). 11/17/13   Arman FilterGail K Schulz, NP  clonazePAM (KLONOPIN) 1 MG tablet Take 1 mg by mouth 2 (two) times daily as needed for anxiety.    Historical Provider, MD  Darunavir Ethanolate (PREZISTA) 800 MG tablet Take 1 tablet (800 mg total) by mouth daily with breakfast. 11/17/13   Arman FilterGail K Schulz, NP  emtricitabine-tenofovir (TRUVADA) 200-300 MG per tablet Take 1 tablet by mouth every morning. 11/17/13   Arman FilterGail K Schulz, NP  fexofenadine (ALLEGRA) 180 MG tablet Take 180 mg by mouth daily.    Historical Provider, MD  hydroxypropyl methylcellulose (ISOPTO TEARS) 2.5 % ophthalmic solution Place 1 drop into both eyes 3 (three) times daily as needed for dry eyes.    Historical Provider, MD  ibuprofen (ADVIL,MOTRIN) 200 MG tablet Take 800 mg by mouth every 6 (six) hours as needed (for pain).     Historical Provider, MD  Multiple Vitamin (MULTIVITAMIN WITH MINERALS) TABS tablet Take 1 tablet by mouth daily.    Historical Provider, MD  oxyCODONE (OXY IR/ROXICODONE) 5 MG immediate release tablet Take 5 mg by mouth every 4 (four) hours as needed for severe pain.    Historical Provider, MD  ritonavir (NORVIR) 100 MG capsule Take 1 capsule (100 mg total) by mouth every morning. 11/17/13   Cipriano MileGail K  Manus Rudd, NP  sertraline (ZOLOFT) 50 MG tablet Take 50 mg by mouth daily.    Historical Provider, MD  sulfamethoxazole-trimethoprim (BACTRIM DS) 800-160 MG per tablet Take 1 tablet by mouth daily. 11/17/13   Arman Filter, NP   BP 133/94  Pulse 65  Temp(Src) 98 F (36.7 C) (Oral)  Resp 18  Ht 5\' 11"  (1.803 m)  Wt 175 lb (79.379 kg)  BMI 24.42 kg/m2  SpO2 93% Physical Exam  Nursing note and vitals reviewed. Constitutional: He is oriented to person, place, and time. He appears well-developed and well-nourished.  HENT:  Head: Normocephalic and atraumatic.  Eyes: Conjunctivae and  EOM are normal. Pupils are equal, round, and reactive to light. Right eye exhibits no discharge. Left eye exhibits no discharge. No scleral icterus.  Neck: Normal range of motion. Neck supple. No JVD present.  Cardiovascular: Normal rate, regular rhythm and normal heart sounds.  Exam reveals no gallop and no friction rub.   No murmur heard. Pulmonary/Chest: Effort normal and breath sounds normal. No respiratory distress. He has no wheezes. He has no rales. He exhibits no tenderness.  Abdominal: Soft. He exhibits no distension and no mass. There is no tenderness. There is no rebound and no guarding.  Musculoskeletal: Normal range of motion. He exhibits no edema and no tenderness.  4/5 strength in left upper extremity, 5/5 strength in right upper extremity, otherwise 5/5 range of motion and strength throughout  Neurological: He is alert and oriented to person, place, and time.  CN 3-12 intact, speech is clear, movements goal oriented  Skin: Skin is warm and dry.  Psychiatric: He has a normal mood and affect. His behavior is normal. Judgment and thought content normal.    ED Course  Procedures (including critical care time) Results for orders placed during the hospital encounter of 04/22/14  ETHANOL      Result Value Ref Range   Alcohol, Ethyl (B) 24 (*) 0 - 11 mg/dL  PROTIME-INR      Result Value Ref Range   Prothrombin Time 13.7  11.6 - 15.2 seconds   INR 1.07  0.00 - 1.49  APTT      Result Value Ref Range   aPTT 32  24 - 37 seconds  CBC      Result Value Ref Range   WBC 1.8 (*) 4.0 - 10.5 K/uL   RBC 3.49 (*) 4.22 - 5.81 MIL/uL   Hemoglobin 11.8 (*) 13.0 - 17.0 g/dL   HCT 32.4 (*) 40.1 - 02.7 %   MCV 96.0  78.0 - 100.0 fL   MCH 33.8  26.0 - 34.0 pg   MCHC 35.2  30.0 - 36.0 g/dL   RDW 25.3  66.4 - 40.3 %   Platelets 81 (*) 150 - 400 K/uL  DIFFERENTIAL      Result Value Ref Range   Neutrophils Relative % 47  43 - 77 %   Lymphocytes Relative 34  12 - 46 %   Monocytes Relative 13  (*) 3 - 12 %   Eosinophils Relative 4  0 - 5 %   Basophils Relative 2 (*) 0 - 1 %   Neutro Abs 0.9 (*) 1.7 - 7.7 K/uL   Lymphs Abs 0.6 (*) 0.7 - 4.0 K/uL   Monocytes Absolute 0.2  0.1 - 1.0 K/uL   Eosinophils Absolute 0.1  0.0 - 0.7 K/uL   Basophils Absolute 0.0  0.0 - 0.1 K/uL   WBC Morphology TOXIC GRANULATION  COMPREHENSIVE METABOLIC PANEL      Result Value Ref Range   Sodium 136 (*) 137 - 147 mEq/L   Potassium 3.7  3.7 - 5.3 mEq/L   Chloride 100  96 - 112 mEq/L   CO2 21  19 - 32 mEq/L   Glucose, Bld 95  70 - 99 mg/dL   BUN 16  6 - 23 mg/dL   Creatinine, Ser 2.951.02  0.50 - 1.35 mg/dL   Calcium 8.5  8.4 - 62.110.5 mg/dL   Total Protein 7.4  6.0 - 8.3 g/dL   Albumin 3.4 (*) 3.5 - 5.2 g/dL   AST 308155 (*) 0 - 37 U/L   ALT 103 (*) 0 - 53 U/L   Alkaline Phosphatase 113  39 - 117 U/L   Total Bilirubin 0.9  0.3 - 1.2 mg/dL   GFR calc non Af Amer 84 (*) >90 mL/min   GFR calc Af Amer >90  >90 mL/min  I-STAT CHEM 8, ED      Result Value Ref Range   Sodium 139  137 - 147 mEq/L   Potassium 3.5 (*) 3.7 - 5.3 mEq/L   Chloride 98  96 - 112 mEq/L   BUN 15  6 - 23 mg/dL   Creatinine, Ser 6.571.10  0.50 - 1.35 mg/dL   Glucose, Bld 95  70 - 99 mg/dL   Calcium, Ion 8.461.15  9.621.12 - 1.23 mmol/L   TCO2 24  0 - 100 mmol/L   Hemoglobin 13.3  13.0 - 17.0 g/dL   HCT 95.239.0  84.139.0 - 32.452.0 %  I-STAT TROPOININ, ED      Result Value Ref Range   Troponin i, poc 0.00  0.00 - 0.08 ng/mL   Comment 3            Ct Head Wo Contrast  04/22/2014   CLINICAL DATA:  Weakness  EXAM: CT HEAD WITHOUT CONTRAST  TECHNIQUE: Contiguous axial images were obtained from the base of the skull through the vertex without intravenous contrast.  COMPARISON:  CT HEAD W/O CM dated 07/18/2013  FINDINGS: There is no evidence of mass effect, midline shift or extra-axial fluid collections. There is no evidence of a space-occupying lesion or intracranial hemorrhage. There is no evidence of a cortical-based area of acute infarction. There is middle  generalized cerebral and cerebellar atrophy. There is mild periventricular white matter low attenuation likely secondary to microangiopathy.  The ventricles and sulci are appropriate for the patient's age. The basal cisterns are patent.  Visualized portions of the orbits are unremarkable. The visualized portions of the paranasal sinuses and mastoid air cells are unremarkable.  The osseous structures are unremarkable.  IMPRESSION: No acute intracranial pathology.   Electronically Signed   By: Elige KoHetal  Patel   On: 04/22/2014 11:28     Imaging Review No results found.   EKG Interpretation None      MDM   Final diagnoses:  Weakness    Patient with AIDS.  New left arm weakness, following questionable syncopal episode last night.  Last normal last night prior to 3 am.  Patient states that symptoms have resolved after pain meds.  Asks for percocet refill and discharge.  I informed him that his workup is not complete.  States that he has to go.   Patients that they would like to leave.  I have discussed my concerns with the patient about them leaving without completing the evaluation.    Patient presents with left arm weakness  Symptoms  include: left arm weakness  Concern for: TIA  Study limitations and other tests offered include: Neurology consult, MRI, observation  Treatment and recommended follow-up include: f/u with PCP, return for new or worsening symptoms.  I do not feel that the patient should leave prior to completing their workup. I have discussed the above symptoms, initial findings, study limitations, and treatment plan with the patient. Patient is not altered, does not have any distracting issues, and has capacity to make decisions for themselves. Patient places themselves at risk of TIA, stroke, worsening symptoms, disability, and/or death.   Roxy Horseman, PA-C 04/22/14 1314

## 2014-04-23 LAB — PATHOLOGIST SMEAR REVIEW

## 2014-06-02 ENCOUNTER — Encounter (HOSPITAL_COMMUNITY): Payer: Self-pay | Admitting: Emergency Medicine

## 2014-06-02 ENCOUNTER — Emergency Department (HOSPITAL_COMMUNITY)
Admission: EM | Admit: 2014-06-02 | Discharge: 2014-06-02 | Disposition: A | Discharge status: Home / Self Care | Payer: Self-pay | Attending: Emergency Medicine | Admitting: Emergency Medicine

## 2014-06-02 DIAGNOSIS — G8929 Other chronic pain: Secondary | ICD-10-CM | POA: Insufficient documentation

## 2014-06-02 DIAGNOSIS — Z8673 Personal history of transient ischemic attack (TIA), and cerebral infarction without residual deficits: Secondary | ICD-10-CM | POA: Insufficient documentation

## 2014-06-02 DIAGNOSIS — M25551 Pain in right hip: Secondary | ICD-10-CM

## 2014-06-02 DIAGNOSIS — Z792 Long term (current) use of antibiotics: Secondary | ICD-10-CM | POA: Insufficient documentation

## 2014-06-02 DIAGNOSIS — F17200 Nicotine dependence, unspecified, uncomplicated: Secondary | ICD-10-CM | POA: Insufficient documentation

## 2014-06-02 DIAGNOSIS — B20 Human immunodeficiency virus [HIV] disease: Secondary | ICD-10-CM | POA: Insufficient documentation

## 2014-06-02 DIAGNOSIS — J45909 Unspecified asthma, uncomplicated: Secondary | ICD-10-CM | POA: Insufficient documentation

## 2014-06-02 DIAGNOSIS — Z760 Encounter for issue of repeat prescription: Secondary | ICD-10-CM | POA: Insufficient documentation

## 2014-06-02 DIAGNOSIS — Z79899 Other long term (current) drug therapy: Secondary | ICD-10-CM | POA: Insufficient documentation

## 2014-06-02 DIAGNOSIS — M25559 Pain in unspecified hip: Secondary | ICD-10-CM | POA: Insufficient documentation

## 2014-06-02 MED ORDER — OXYCODONE HCL 5 MG PO TABS
10.0000 mg | ORAL_TABLET | Freq: Once | ORAL | Status: AC
  Administered 2014-06-02: 10 mg via ORAL
  Filled 2014-06-02: qty 2

## 2014-06-02 MED ORDER — ALBUTEROL SULFATE HFA 108 (90 BASE) MCG/ACT IN AERS
2.0000 | INHALATION_SPRAY | RESPIRATORY_TRACT | Status: DC | PRN
  Administered 2014-06-02: 2 via RESPIRATORY_TRACT
  Filled 2014-06-02: qty 6.7

## 2014-06-02 NOTE — ED Provider Notes (Signed)
CSN: [id number]     Arrival date & time 06/02/14  0003 History   First MD Initiated Contact with Patient 06/02/14 0020     Chief Complaint  Patient presents with  . Hip Pain     (Consider location/radiation/quality/duration/timing/severity/associated sxs/prior Treatment) The history is provided by the patient and medical records. No language interpreter was used.    Shivank Pinedo is a 48 y.o. male  with a hx of HIV, Hep C, asthma, AIDS, CVA, chronic back pain presents to the Emergency Department complaining of gradual, persistent, progressively worsening chronic right hip pain onset 2 weeks. Associated symptoms include radiation down the right leg.  Pt reports taking Oxy IR which gives moderate relief, but he has run out of these and the OTC medications are not working. Pt reports that he is to see [place] [facility] in 2 days.   Nothing else makes it better and walking makes it workse.  Pt denies fever, chills, headache, neck pain, chest pain, pain, weakness, numbness, difficulty walking. Patient also reports he is out of his albuterol inhaler and cannot afford to fill his prescription. He reports he currently feels short of breath.  Patient requesting refill of his oxycodone..     Past Medical History  Diagnosis Date  . HIV (human immunodeficiency virus infection)   . Hepatitis C   . Asthma   . AIDS   . Cerebral hemorrhage    Past Surgical History  Procedure Laterality Date  . Hernia repair    . Mandible surgery    . Laporotomy     Family History  Problem Relation Age of Onset  . CAD Mother   . Stroke Mother    History  Substance Use Topics  . Smoking status: Current Every Day Smoker -- 2.00 packs/day for 38 years    Types: Cigarettes  . Smokeless tobacco: Never Used  . Alcohol Use: Yes     Comment: occasional    Review of Systems  Constitutional: Negative for fever, diaphoresis, appetite change, fatigue and unexpected weight change.  HENT: Negative for mouth  sores.   Eyes: Negative for visual disturbance.  Respiratory: Positive for chest tightness and shortness of breath. Negative for cough and wheezing.   Cardiovascular: Negative for chest pain.  Gastrointestinal: Negative for nausea, vomiting, abdominal pain, diarrhea and constipation.  Endocrine: Negative for polydipsia, polyphagia and polyuria.  Genitourinary: Negative for dysuria, urgency, frequency and hematuria.  Musculoskeletal: Positive for arthralgias (right hip). Negative for back pain and neck stiffness.  Skin: Negative for rash.  Allergic/Immunologic: Negative for immunocompromised state.  Neurological: Negative for syncope, light-headedness and headaches.  Hematological: Does not bruise/bleed easily.  Psychiatric/Behavioral: Negative for sleep disturbance. The patient is not nervous/anxious.       Allergies  Aspirin  Home Medications   Prior to Admission medications   Medication Sig Start Date End Date Taking? Authorizing Provider  albuterol (PROVENTIL HFA;VENTOLIN HFA) 108 (90 BASE) MCG/ACT inhaler Inhale 2 puffs into the lungs every 6 (six) hours as needed for wheezing or shortness of breath (cough). 11/17/13   Arman Filter, NP  clonazePAM (KLONOPIN) 1 MG tablet Take 1 mg by mouth 2 (two) times daily as needed for anxiety.    Historical Provider, MD  Darunavir Ethanolate (PREZISTA) 800 MG tablet Take 1 tablet (800 mg total) by mouth daily with breakfast. 11/17/13   Arman Filter, NP  emtricitabine-tenofovir (TRUVADA) 200-300 MG per tablet Take 1 tablet by mouth every morning. 11/17/13   Arman Filter,  NP  fexofenadine (ALLEGRA) 180 MG tablet Take 180 mg by mouth daily.    Historical Provider, MD  hydroxypropyl methylcellulose (ISOPTO TEARS) 2.5 % ophthalmic solution Place 1 drop into both eyes 3 (three) times daily as needed for dry eyes.    Historical Provider, MD  Multiple Vitamin (MULTIVITAMIN WITH MINERALS) TABS tablet Take 1 tablet by mouth daily.    Historical  Provider, MD  oxyCODONE (OXY IR/ROXICODONE) 5 MG immediate release tablet Take 5 mg by mouth every 4 (four) hours as needed for severe pain.    Historical Provider, MD  promethazine (PHENERGAN) 25 MG tablet Take 25 mg by mouth every 6 (six) hours as needed for nausea or vomiting.    Historical Provider, MD  ritonavir (NORVIR) 100 MG capsule Take 1 capsule (100 mg total) by mouth every morning. 11/17/13   Arman FilterGail K Schulz, NP  sertraline (ZOLOFT) 50 MG tablet Take 50 mg by mouth daily.    Historical Provider, MD  sulfamethoxazole-trimethoprim (BACTRIM DS) 800-160 MG per tablet Take 1 tablet by mouth daily. 11/17/13   Arman FilterGail K Schulz, NP   BP 126/72  Pulse 77  Resp 18  SpO2 99% Physical Exam  Nursing note and vitals reviewed. Constitutional: He is oriented to person, place, and time. He appears well-developed and well-nourished. No distress.  HENT:  Head: Normocephalic and atraumatic.  Right Ear: Tympanic membrane, external ear and ear canal normal.  Left Ear: Tympanic membrane, external ear and ear canal normal.  Nose: Nose normal. No epistaxis. Right sinus exhibits no maxillary sinus tenderness and no frontal sinus tenderness. Left sinus exhibits no maxillary sinus tenderness and no frontal sinus tenderness.  Mouth/Throat: Uvula is midline, oropharynx is clear and moist and mucous membranes are normal. Mucous membranes are not pale and not cyanotic. No oropharyngeal exudate, posterior oropharyngeal edema, posterior oropharyngeal erythema or tonsillar abscesses.  Eyes: Conjunctivae are normal. Pupils are equal, round, and reactive to light.  Neck: Normal range of motion and full passive range of motion without pain. Neck supple.  Full ROM without pain  Cardiovascular: Normal rate, regular rhythm, normal heart sounds and intact distal pulses.   No murmur heard. Regular Rate and rhythm  Pulmonary/Chest: Effort normal and breath sounds normal. No stridor. No respiratory distress. He has no wheezes.   Clear and equal breath sounds with good tidal volume  Abdominal: Soft. Bowel sounds are normal. He exhibits no distension. There is no tenderness.  Musculoskeletal: Normal range of motion.  Full range of motion of the T-spine and L-spine No tenderness to palpation of the spinous processes of the T-spine or L-spine No tenderness to palpation of the paraspinous muscles of the L-spine Full range of motion of the right hip with pain, full range of motion of the left hip with no pain  Lymphadenopathy:    He has no cervical adenopathy.  Neurological: He is alert and oriented to person, place, and time. He has normal reflexes. He exhibits normal muscle tone. Coordination normal.  Speech is clear and goal oriented, follows commands Normal 5/5 strength in upper and lower extremities bilaterally including dorsiflexion and plantar flexion, strong and equal grip strength Sensation normal to light and sharp touch Moves extremities without ataxia, coordination intact Normal gait; Normal balance  Skin: Skin is warm and dry. No rash noted. He is not diaphoretic. No erythema.  Psychiatric: He has a normal mood and affect. His behavior is normal.    ED Course  Procedures (including critical care time) Labs Review Labs  Reviewed - No data to display  Imaging Review No results found.   EKG Interpretation None      MDM   Final diagnoses:  Medication refill  Chronic hip pain, right   Lavonia DanaBryant Fabrizio presents with acute flare of chronic pain asking for pain control and refills of his pain medication.  Patient ambulates without difficulty or antalgic gait and appears to be in no distress.  He has a smell of EtOH about him.  Patient without fever or tachycardia and his nonseptic appearing; no erythema or induration over the hip to suggest septic joint.  Review shows the patient has been seen multiple times here in the last 6 months and [facility]  Discussed with patient at length that I will treat his pain here tonight but will not write pain prescriptions for home use as he will need to see his primary care or pain management clinic.  He also complains of shortness of breath and being out of his albuterol inhaler. On exam he is clear and equal breath sounds with excellent tidal volume without focal wheezes, rhonchi or radials. We'll provide albuterol inhaler here in the emergency department. He reports he is a followup of his infectious disease physician in the morning.  I have personally reviewed patient's vitals, nursing note and any pertinent labs or imaging.At this time, it has been determined that no acute conditions requiring further emergency intervention. The patient/guardian have been advised of the diagnosis and plan. I reviewed all labs and imaging including any potential incidental findings. We have discussed signs and symptoms that warrant return to the ED, such as SOB, fevers.  Patient/guardian has voiced understanding and agreed to follow-up with the PCP or specialist in 2 days.  Vital signs are stable at discharge.   BP 126/72  Pulse 77  Resp 18  SpO2 99%        Dierdre ForthHannah Muthersbaugh, PA-C 06/02/14 [phone]

## 2014-06-02 NOTE — Discharge Instructions (Signed)
1. Medications: usual home medications 2. Treatment: rest, drink plenty of fluids,  3. Follow Up: Please followup with your primary doctor in 2 days for discussion of your diagnoses and further evaluation after today's visit;    Arthralgia Your caregiver has diagnosed you as suffering from an arthralgia. Arthralgia means there is pain in a joint. This can come from many reasons including:  Bruising the joint which causes soreness (inflammation) in the joint.  Wear and tear on the joints which occur as we grow older (osteoarthritis).  Overusing the joint.  Various forms of arthritis.  Infections of the joint. Regardless of the cause of pain in your joint, most of these different pains respond to anti-inflammatory drugs and rest. The exception to this is when a joint is infected, and these cases are treated with antibiotics, if it is a bacterial infection. HOME CARE INSTRUCTIONS   Rest the injured area for as long as directed by your caregiver. Then slowly start using the joint as directed by your caregiver and as the pain allows. Crutches as directed may be useful if the ankles, knees or hips are involved. If the knee was splinted or casted, continue use and care as directed. If an stretchy or elastic wrapping bandage has been applied today, it should be removed and re-applied every 3 to 4 hours. It should not be applied tightly, but firmly enough to keep swelling down. Watch toes and feet for swelling, bluish discoloration, coldness, numbness or excessive pain. If any of these problems (symptoms) occur, remove the ace bandage and re-apply more loosely. If these symptoms persist, contact your caregiver or return to this location.  For the first 24 hours, keep the injured extremity elevated on pillows while lying down.  Apply ice for 15-20 minutes to the sore joint every couple hours while awake for the first half day. Then 03-04 times per day for the first 48 hours. Put the ice in a plastic  bag and place a towel between the bag of ice and your skin.  Wear any splinting, casting, elastic bandage applications, or slings as instructed.  Only take over-the-counter or prescription medicines for pain, discomfort, or fever as directed by your caregiver. Do not use aspirin immediately after the injury unless instructed by your physician. Aspirin can cause increased bleeding and bruising of the tissues.  If you were given crutches, continue to use them as instructed and do not resume weight bearing on the sore joint until instructed. Persistent pain and inability to use the sore joint as directed for more than 2 to 3 days are warning signs indicating that you should see a caregiver for a follow-up visit as soon as possible. Initially, a hairline fracture (break in bone) may not be evident on X-rays. Persistent pain and swelling indicate that further evaluation, non-weight bearing or use of the joint (use of crutches or slings as instructed), or further X-rays are indicated. X-rays may sometimes not show a small fracture until a week or 10 days later. Make a follow-up appointment with your own caregiver or one to whom we have referred you. A radiologist (specialist in reading X-rays) may read your X-rays. Make sure you know how you are to obtain your X-ray results. Do not assume everything is normal if you do not hear from us. SEEK MEDICAL CARE IF: Bruising, swelling, or pain increases. SEEK IMMEDIATE MEDICAL CARE IF:   Your fingers or toes are numb or blue.  The pain is not responding to medications and continues  to stay the same or get worse.  The pain in your joint becomes severe.  You develop a fever over 102 F (38.9 C).  It becomes impossible to move or use the joint. MAKE SURE YOU:   Understand these instructions.  Will watch your condition.  Will get help right away if you are not doing well or get worse. Document Released: 11/20/2005 Document Revised: 02/12/2012 Document  Reviewed: 07/08/2008 [facility] Patient Information 2015 [place]. This information is not intended to replace advice given to you by your health care provider. Make sure you discuss any questions you have with your health care provider.

## 2014-06-02 NOTE — ED Notes (Addendum)
Pt in via EMS to triage, c/o right hip pain that radiates down his leg, symptoms x2 weeks, out of medications that he normally takes at home, no relief with OTC medications. ETOH use.

## 2014-06-02 NOTE — ED Provider Notes (Signed)
Medical screening examination/treatment/procedure(s) were performed by non-physician practitioner and as supervising physician I was immediately available for consultation/collaboration.   EKG Interpretation None        David Yelverton, MD 06/02/14 0556 

## 2014-06-10 ENCOUNTER — Encounter (HOSPITAL_COMMUNITY): Payer: Self-pay | Admitting: Emergency Medicine

## 2014-06-10 DIAGNOSIS — B20 Human immunodeficiency virus [HIV] disease: Secondary | ICD-10-CM | POA: Insufficient documentation

## 2014-06-10 DIAGNOSIS — M25559 Pain in unspecified hip: Secondary | ICD-10-CM | POA: Insufficient documentation

## 2014-06-10 DIAGNOSIS — G8929 Other chronic pain: Secondary | ICD-10-CM | POA: Insufficient documentation

## 2014-06-10 DIAGNOSIS — F17200 Nicotine dependence, unspecified, uncomplicated: Secondary | ICD-10-CM | POA: Insufficient documentation

## 2014-06-10 DIAGNOSIS — J45909 Unspecified asthma, uncomplicated: Secondary | ICD-10-CM | POA: Insufficient documentation

## 2014-06-10 NOTE — ED Notes (Signed)
Pt. arrived with EMS from home reports chronic right hip pain for several years worse these past weeks , denies injury / ambulatory , requesting prescription pain medications .

## 2014-06-11 ENCOUNTER — Emergency Department (HOSPITAL_COMMUNITY)
Admission: EM | Admit: 2014-06-11 | Discharge: 2014-06-11 | Discharge status: Against Medical Advice | Payer: Self-pay | Attending: Emergency Medicine | Admitting: Emergency Medicine

## 2014-06-21 ENCOUNTER — Emergency Department (HOSPITAL_COMMUNITY)
Admission: EM | Admit: 2014-06-21 | Discharge: 2014-06-21 | Discharge status: Against Medical Advice | Payer: Self-pay | Attending: Emergency Medicine | Admitting: Emergency Medicine

## 2014-06-21 ENCOUNTER — Encounter (HOSPITAL_COMMUNITY): Payer: Self-pay | Admitting: Emergency Medicine

## 2014-06-21 DIAGNOSIS — Z760 Encounter for issue of repeat prescription: Secondary | ICD-10-CM | POA: Insufficient documentation

## 2014-06-21 DIAGNOSIS — J45909 Unspecified asthma, uncomplicated: Secondary | ICD-10-CM | POA: Insufficient documentation

## 2014-06-21 DIAGNOSIS — B20 Human immunodeficiency virus [HIV] disease: Secondary | ICD-10-CM | POA: Insufficient documentation

## 2014-06-21 DIAGNOSIS — F17200 Nicotine dependence, unspecified, uncomplicated: Secondary | ICD-10-CM | POA: Insufficient documentation

## 2014-06-21 NOTE — ED Notes (Signed)
He went to pick up his meds today at drugstore and the doctor hadnt called them in yet. He states he called ems tobring him here so we could give him some samples of his medications

## 2014-06-21 NOTE — ED Notes (Signed)
PT LEFT ED. STATED HIS KID WERE CALLING HIM.

## 2014-06-23 NOTE — ED Provider Notes (Signed)
Patient disruptive and repeatedly comes to provider desk to ask about wait time.I reviewed his chart and the patient's pcp has refused any further pain meds.  called his pharmacist and she states that they have had problems with misuse,/multiple providers/ and disruptive behavior from the patient. patient eloped before provider assessment   Arthor Captainbigail Zareya Tuckett, PA-C 06/23/14 2148

## 2014-06-24 NOTE — ED Provider Notes (Signed)
Medical screening examination/treatment/procedure(s) were performed by non-physician practitioner and as supervising physician I was immediately available for consultation/collaboration.   EKG Interpretation None       Sawyer Kahan R. Daking Westervelt, MD 06/24/14 1328 

## 2014-09-22 ENCOUNTER — Encounter (HOSPITAL_COMMUNITY): Payer: Self-pay | Admitting: Emergency Medicine

## 2014-09-22 ENCOUNTER — Emergency Department (HOSPITAL_COMMUNITY)
Admission: EM | Admit: 2014-09-22 | Discharge: 2014-09-22 | Discharge status: Against Medical Advice | Payer: Self-pay | Attending: Emergency Medicine | Admitting: Emergency Medicine

## 2014-09-22 DIAGNOSIS — J45909 Unspecified asthma, uncomplicated: Secondary | ICD-10-CM | POA: Insufficient documentation

## 2014-09-22 DIAGNOSIS — M25552 Pain in left hip: Secondary | ICD-10-CM | POA: Insufficient documentation

## 2014-09-22 DIAGNOSIS — Z720 Tobacco use: Secondary | ICD-10-CM | POA: Insufficient documentation

## 2014-09-22 DIAGNOSIS — M25551 Pain in right hip: Secondary | ICD-10-CM | POA: Insufficient documentation

## 2014-09-22 NOTE — ED Notes (Signed)
Pt reports bilateral hip pain since 1985. Pt reports that he has multiple medications for same but that pain has increased recently. PT is ambulatory to triage. Denies any new injuries.

## 2014-09-22 NOTE — ED Notes (Signed)
Patient not in waiting room  

## 2014-09-27 ENCOUNTER — Emergency Department (HOSPITAL_COMMUNITY)
Admission: EM | Admit: 2014-09-27 | Discharge: 2014-09-27 | Discharge status: Against Medical Advice | Payer: Self-pay | Attending: Emergency Medicine | Admitting: Emergency Medicine

## 2014-09-27 ENCOUNTER — Encounter (HOSPITAL_COMMUNITY): Payer: Self-pay | Admitting: Emergency Medicine

## 2014-09-27 ENCOUNTER — Emergency Department (HOSPITAL_COMMUNITY)
Admission: EM | Admit: 2014-09-27 | Discharge: 2014-09-28 | Discharge status: Against Medical Advice | Payer: Self-pay | Attending: Emergency Medicine | Admitting: Emergency Medicine

## 2014-09-27 DIAGNOSIS — Z760 Encounter for issue of repeat prescription: Secondary | ICD-10-CM | POA: Insufficient documentation

## 2014-09-27 DIAGNOSIS — Z79899 Other long term (current) drug therapy: Secondary | ICD-10-CM | POA: Insufficient documentation

## 2014-09-27 DIAGNOSIS — J45909 Unspecified asthma, uncomplicated: Secondary | ICD-10-CM | POA: Insufficient documentation

## 2014-09-27 DIAGNOSIS — M791 Myalgia: Secondary | ICD-10-CM | POA: Insufficient documentation

## 2014-09-27 DIAGNOSIS — B20 Human immunodeficiency virus [HIV] disease: Secondary | ICD-10-CM | POA: Insufficient documentation

## 2014-09-27 DIAGNOSIS — Z792 Long term (current) use of antibiotics: Secondary | ICD-10-CM | POA: Insufficient documentation

## 2014-09-27 DIAGNOSIS — Z720 Tobacco use: Secondary | ICD-10-CM | POA: Insufficient documentation

## 2014-09-27 DIAGNOSIS — Z8679 Personal history of other diseases of the circulatory system: Secondary | ICD-10-CM | POA: Insufficient documentation

## 2014-09-27 NOTE — ED Notes (Addendum)
Pt has checked back in to be seen, pt requesting Oxycodone 5mg  (with no "buffers") x 2 and Restaril "to get me through the night" Pt is part of Opiod clinic in [place]. Pt also reports drinking etoh tonight.

## 2014-09-27 NOTE — ED Notes (Signed)
Pt refusing to stay in room. Pt demanding to know wait time.

## 2014-09-27 NOTE — ED Notes (Signed)
Pt presents requesting pain medication. Pt c/o pain "all over", pt states he needs straight oxycodone, has been out of medication x 4 days. Pt states he drank 4 beers tonight.

## 2014-09-28 MED ORDER — OXYCODONE-ACETAMINOPHEN 5-325 MG PO TABS
2.0000 | ORAL_TABLET | Freq: Once | ORAL | Status: AC
  Administered 2014-09-28: 2 via ORAL
  Filled 2014-09-28: qty 2

## 2014-09-28 MED ORDER — ALBUTEROL SULFATE HFA 108 (90 BASE) MCG/ACT IN AERS
1.0000 | INHALATION_SPRAY | Freq: Once | RESPIRATORY_TRACT | Status: AC
  Administered 2014-09-28: 1 via RESPIRATORY_TRACT
  Filled 2014-09-28: qty 6.7

## 2014-09-28 NOTE — ED Provider Notes (Signed)
CSN: [id number]     Arrival date & time 09/27/14  2227 History   First MD Initiated Contact with Patient 09/27/14 2238     Chief Complaint  Patient presents with  . Medication Refill     (Consider location/radiation/quality/duration/timing/severity/associated sxs/prior Treatment) HPI Comments: Patient is a 48 year old male with a past medical history of HIV/AIDS, hepatitis C, and asthma who presents with "pain all over." Patient reports the pain is chronic and has been worse since being out of his oxycodone for the past 4 days. Patient denies any injury or other symptoms. No aggravating/alleviating factors. No other associated symptoms.    Past Medical History  Diagnosis Date  . HIV (human immunodeficiency virus infection)   . Hepatitis C   . Asthma   . AIDS   . Cerebral hemorrhage    Past Surgical History  Procedure Laterality Date  . Hernia repair    . Mandible surgery    . Laporotomy     Family History  Problem Relation Age of Onset  . CAD Mother   . Stroke Mother    History  Substance Use Topics  . Smoking status: Current Every Day Smoker -- 2.00 packs/day for 38 years    Types: Cigarettes  . Smokeless tobacco: Never Used  . Alcohol Use: Yes     Comment: 4 beers tonight    Review of Systems  Constitutional: Negative for fever, chills and fatigue.  HENT: Negative for trouble swallowing.   Eyes: Negative for visual disturbance.  Respiratory: Negative for shortness of breath.   Cardiovascular: Negative for chest pain and palpitations.  Gastrointestinal: Negative for nausea, vomiting, abdominal pain and diarrhea.  Genitourinary: Negative for dysuria and difficulty urinating.  Musculoskeletal: Positive for myalgias. Negative for arthralgias and neck pain.  Skin: Negative for color change.  Neurological: Negative for dizziness and weakness.  Psychiatric/Behavioral: Negative for dysphoric mood.      Allergies  Aspirin  Home Medications   Prior to Admission  medications   Medication Sig Start Date End Date Taking? Authorizing Provider  albuterol (PROVENTIL HFA;VENTOLIN HFA) 108 (90 BASE) MCG/ACT inhaler Inhale 2 puffs into the lungs every 6 (six) hours as needed for wheezing or shortness of breath (cough). 11/17/13   Arman FilterGail K Schulz, NP  clonazePAM (KLONOPIN) 1 MG tablet Take 1 mg by mouth 2 (two) times daily as needed for anxiety.    Historical Provider, MD  Darunavir Ethanolate (PREZISTA) 800 MG tablet Take 1 tablet (800 mg total) by mouth daily with breakfast. 11/17/13   Arman FilterGail K Schulz, NP  emtricitabine-tenofovir (TRUVADA) 200-300 MG per tablet Take 1 tablet by mouth every morning. 11/17/13   Arman FilterGail K Schulz, NP  fexofenadine (ALLEGRA) 180 MG tablet Take 180 mg by mouth daily.    Historical Provider, MD  hydroxypropyl methylcellulose (ISOPTO TEARS) 2.5 % ophthalmic solution Place 1 drop into both eyes 3 (three) times daily as needed for dry eyes.    Historical Provider, MD  Multiple Vitamin (MULTIVITAMIN WITH MINERALS) TABS tablet Take 1 tablet by mouth daily.    Historical Provider, MD  oxyCODONE (OXY IR/ROXICODONE) 5 MG immediate release tablet Take 5 mg by mouth every 4 (four) hours as needed for severe pain.    Historical Provider, MD  promethazine (PHENERGAN) 25 MG tablet Take 25 mg by mouth every 6 (six) hours as needed for nausea or vomiting.    Historical Provider, MD  ritonavir (NORVIR) 100 MG capsule Take 1 capsule (100 mg total) by mouth every morning.  11/17/13   Arman FilterGail K Schulz, NP  sertraline (ZOLOFT) 50 MG tablet Take 50 mg by mouth daily.    Historical Provider, MD  sulfamethoxazole-trimethoprim (BACTRIM DS) 800-160 MG per tablet Take 1 tablet by mouth daily. 11/17/13   Arman FilterGail K Schulz, NP   Ht 5\' 10"  (1.778 m)  Wt 191 lb (86.637 kg)  BMI 27.41 kg/m2 Physical Exam  Nursing note and vitals reviewed. Constitutional: He is oriented to person, place, and time. He appears well-developed and well-nourished. No distress.  HENT:  Head:  Normocephalic and atraumatic.  Eyes: Conjunctivae and EOM are normal.  Neck: Normal range of motion.  Cardiovascular: Normal rate and regular rhythm.  Exam reveals no gallop and no friction rub.   No murmur heard. Pulmonary/Chest: Effort normal and breath sounds normal. He has no wheezes. He has no rales. He exhibits no tenderness.  Abdominal: Soft. There is no tenderness.  Musculoskeletal: Normal range of motion.  Neurological: He is alert and oriented to person, place, and time. Coordination normal.  Speech is goal-oriented. Moves limbs without ataxia.   Skin: Skin is warm and dry.  Psychiatric: He has a normal mood and affect. His behavior is normal.    ED Course  Procedures (including critical care time) Labs Review Labs Reviewed - No data to display  Imaging Review No results found.   EKG Interpretation None      MDM   Final diagnoses:  Medication refill   Patient will have 2 percocet here and albuterol inhaler. Patient [facility] that he will follow up with. Vital stable and patient afebrile.     Emilia BeckKaitlyn Quynn Vilchis, [place]-C 09/28/14 [phone]

## 2014-09-28 NOTE — ED Provider Notes (Signed)
Medical screening examination/treatment/procedure(s) were performed by non-physician practitioner and as supervising physician I was immediately available for consultation/collaboration.   Dione Boozeavid Nikki Glanzer, MD 09/28/14 [phone]

## 2015-03-23 ENCOUNTER — Encounter (HOSPITAL_COMMUNITY): Payer: Self-pay | Admitting: Emergency Medicine

## 2015-03-23 ENCOUNTER — Emergency Department (HOSPITAL_COMMUNITY)
Admission: EM | Admit: 2015-03-23 | Discharge: 2015-03-23 | Disposition: A | Discharge status: Home / Self Care | Payer: Self-pay | Attending: Emergency Medicine | Admitting: Emergency Medicine

## 2015-03-23 DIAGNOSIS — Z8619 Personal history of other infectious and parasitic diseases: Secondary | ICD-10-CM | POA: Insufficient documentation

## 2015-03-23 DIAGNOSIS — Z792 Long term (current) use of antibiotics: Secondary | ICD-10-CM | POA: Insufficient documentation

## 2015-03-23 DIAGNOSIS — B20 Human immunodeficiency virus [HIV] disease: Secondary | ICD-10-CM | POA: Insufficient documentation

## 2015-03-23 DIAGNOSIS — R0981 Nasal congestion: Secondary | ICD-10-CM | POA: Insufficient documentation

## 2015-03-23 DIAGNOSIS — R05 Cough: Secondary | ICD-10-CM | POA: Insufficient documentation

## 2015-03-23 DIAGNOSIS — R059 Cough, unspecified: Secondary | ICD-10-CM

## 2015-03-23 DIAGNOSIS — F1029 Alcohol dependence with unspecified alcohol-induced disorder: Secondary | ICD-10-CM

## 2015-03-23 DIAGNOSIS — J3489 Other specified disorders of nose and nasal sinuses: Secondary | ICD-10-CM | POA: Insufficient documentation

## 2015-03-23 DIAGNOSIS — J45909 Unspecified asthma, uncomplicated: Secondary | ICD-10-CM | POA: Insufficient documentation

## 2015-03-23 DIAGNOSIS — F10129 Alcohol abuse with intoxication, unspecified: Secondary | ICD-10-CM | POA: Insufficient documentation

## 2015-03-23 DIAGNOSIS — Z8679 Personal history of other diseases of the circulatory system: Secondary | ICD-10-CM | POA: Insufficient documentation

## 2015-03-23 DIAGNOSIS — Z79899 Other long term (current) drug therapy: Secondary | ICD-10-CM | POA: Insufficient documentation

## 2015-03-23 MED ORDER — BENZONATATE 100 MG PO CAPS: 100.0000 mg | ORAL_CAPSULE | Freq: Once | ORAL | Status: DC

## 2015-03-23 MED ORDER — BENZONATATE 100 MG PO CAPS
200.0000 mg | ORAL_CAPSULE | Freq: Once | ORAL | Status: AC
  Administered 2015-03-23: 200 mg via ORAL
  Filled 2015-03-23: qty 2

## 2015-03-23 MED ORDER — CHLORDIAZEPOXIDE HCL 10 MG PO CAPS: 10.0000 mg | ORAL_CAPSULE | Freq: Three times a day (TID) | ORAL | Status: DC

## 2015-03-23 MED ORDER — BENZONATATE 200 MG PO CAPS: 200.0000 mg | ORAL_CAPSULE | Freq: Three times a day (TID) | ORAL | Status: DC | PRN

## 2015-03-23 NOTE — ED Provider Notes (Signed)
CSN: [id number]     Arrival date & time 03/23/15  0019 History   First MD Initiated Contact with Patient 03/23/15 0204     Chief Complaint  Patient presents with  . Shortness of Breath     (Consider location/radiation/quality/duration/timing/severity/associated sxs/prior Treatment) HPI Comments: The patient has multiple complaints. He has a cough that has been diagnosed as bronchitis, treated with Flonase and inhalers. No fever. He has a history of HIV and [facility]. He has been seen in the past week for his cough and reports no pneumonia on chest x-ray. He continues to be afebrile, but the cough is no better. He is requesting "a cough medication that will let me sleep".  He also states he has Hepatitis C and is pending treatment, however, treatment will be delayed if he is showing any continued alcohol use. He states he is a daily drinker. He is requesting librium to help him quit drinking so he can receive treatment for his hepatitis.  The history is provided by the patient. No language interpreter was used.    Past Medical History  Diagnosis Date  . HIV (human immunodeficiency virus infection)   . Hepatitis C   . Asthma   . AIDS   . Cerebral hemorrhage    Past Surgical History  Procedure Laterality Date  . Hernia repair    . Mandible surgery    . Laporotomy     Family History  Problem Relation Age of Onset  . CAD Mother   . Stroke Mother    History  Substance Use Topics  . Smoking status: Current Every Day Smoker -- 2.00 packs/day for 38 years    Types: Cigarettes  . Smokeless tobacco: Never Used  . Alcohol Use: Yes     Comment: 4 beers tonight    Review of Systems  Constitutional: Negative for fever and chills.  HENT: Positive for congestion and sinus pressure.   Respiratory: Positive for cough.   Cardiovascular: Negative.   Gastrointestinal: Negative.   Musculoskeletal: Negative.   Skin: Negative.   Neurological: Negative.        Allergies  Aspirin  Home Medications   Prior to Admission medications   Medication Sig Start Date End Date Taking? Authorizing Provider  albuterol (PROVENTIL HFA;VENTOLIN HFA) 108 (90 BASE) MCG/ACT inhaler Inhale 2 puffs into the lungs every 6 (six) hours as needed for wheezing or shortness of breath (cough). 11/17/13   Earley FavorGail Schulz, NP  clonazePAM (KLONOPIN) 1 MG tablet Take 1 mg by mouth 2 (two) times daily as needed for anxiety.    Historical Provider, MD  Darunavir Ethanolate (PREZISTA) 800 MG tablet Take 1 tablet (800 mg total) by mouth daily with breakfast. 11/17/13   Earley FavorGail Schulz, NP  emtricitabine-tenofovir (TRUVADA) 200-300 MG per tablet Take 1 tablet by mouth every morning. 11/17/13   Earley FavorGail Schulz, NP  fexofenadine (ALLEGRA) 180 MG tablet Take 180 mg by mouth daily.    Historical Provider, MD  hydroxypropyl methylcellulose (ISOPTO TEARS) 2.5 % ophthalmic solution Place 1 drop into both eyes 3 (three) times daily as needed for dry eyes.    Historical Provider, MD  Multiple Vitamin (MULTIVITAMIN WITH MINERALS) TABS tablet Take 1 tablet by mouth daily.    Historical Provider, MD  oxyCODONE (OXY IR/ROXICODONE) 5 MG immediate release tablet Take 5 mg by mouth every 4 (four) hours as needed for severe pain.    Historical Provider, MD  promethazine (PHENERGAN) 25 MG tablet Take 25 mg  by mouth every 6 (six) hours as needed for nausea or vomiting.    Historical Provider, MD  ritonavir (NORVIR) 100 MG capsule Take 1 capsule (100 mg total) by mouth every morning. 11/17/13   Earley Favor, NP  sertraline (ZOLOFT) 50 MG tablet Take 50 mg by mouth daily.    Historical Provider, MD  sulfamethoxazole-trimethoprim (BACTRIM DS) 800-160 MG per tablet Take 1 tablet by mouth daily. 11/17/13   Earley Favor, NP   BP 153/90 mmHg  Pulse 98  Temp(Src) 98.1 F (36.7 C) (Oral)  Resp 16  SpO2 100% Physical Exam  Constitutional: He is oriented to person, place, and time. He appears well-developed and  well-nourished.  Neck: Normal range of motion.  Pulmonary/Chest: Effort normal.  Musculoskeletal: Normal range of motion.  Neurological: He is alert and oriented to person, place, and time.  Skin: Skin is warm and dry.  Psychiatric: He has a normal mood and affect.    ED Course  Procedures (including critical care time) Labs Review Labs Reviewed - No data to display  Imaging Review No results found.   EKG Interpretation None      MDM   Final diagnoses:  None    1. Cough 2. Alcohol dependence  The patient is alert, oriented, rational. His cough is currently being treated by his primary care physician and repeat chest x-ray is not warranted tonight. Will treat cough with Tessalon. Discussed with Dr. Blinda Leatherwood. Will provide librium for alcohol dependence. He is no suicidal/homicidal. Stable for discharge.     Elpidio Anis, PA-C 03/23/15 0315  Gilda Crease, MD 03/23/15 [phone]

## 2015-03-23 NOTE — ED Notes (Signed)
Pt states he has been coughing for the past 7 days. Pt states he is SOB. Cough is productive with white and yellow sputum.

## 2015-03-23 NOTE — Discharge Instructions (Signed)
FOLLOW UP WITH YOUR DOCTOR FOR RECHECK THIS WEEK. TAKE MEDICATIONS AS PRESCRIBED.

## 2015-04-23 ENCOUNTER — Telehealth: Payer: Self-pay

## 2015-04-23 NOTE — Telephone Encounter (Signed)
[facility] appointment. Date and time given. Information given regarding documents needed to qualify for financial eligibility.   [place]  Patient advised to come prior to visit with Dr Ninetta LightsHatcher to qualify for ADAP and Juanell Fairlyyan White.  He stated someone is sending this information in the mail but I do not think it is our clinic.   Due to transportation issues he is not able to come for lab and office visit .  He may need Paramedic[facility] counselor or [facility] referral. .   Odessa Flemingammy K King,RN

## 2015-05-03 ENCOUNTER — Encounter (HOSPITAL_COMMUNITY): Payer: Self-pay | Admitting: *Deleted

## 2015-05-03 ENCOUNTER — Emergency Department (HOSPITAL_COMMUNITY)
Admission: EM | Admit: 2015-05-03 | Discharge: 2015-05-03 | Disposition: A | Discharge status: Home / Self Care | Payer: Self-pay | Attending: Emergency Medicine | Admitting: Emergency Medicine

## 2015-05-03 DIAGNOSIS — R197 Diarrhea, unspecified: Secondary | ICD-10-CM | POA: Insufficient documentation

## 2015-05-03 DIAGNOSIS — Z792 Long term (current) use of antibiotics: Secondary | ICD-10-CM | POA: Insufficient documentation

## 2015-05-03 DIAGNOSIS — B20 Human immunodeficiency virus [HIV] disease: Secondary | ICD-10-CM | POA: Insufficient documentation

## 2015-05-03 DIAGNOSIS — R61 Generalized hyperhidrosis: Secondary | ICD-10-CM | POA: Insufficient documentation

## 2015-05-03 DIAGNOSIS — Z760 Encounter for issue of repeat prescription: Secondary | ICD-10-CM

## 2015-05-03 DIAGNOSIS — Z720 Tobacco use: Secondary | ICD-10-CM | POA: Insufficient documentation

## 2015-05-03 DIAGNOSIS — R1110 Vomiting, unspecified: Secondary | ICD-10-CM | POA: Insufficient documentation

## 2015-05-03 DIAGNOSIS — J45909 Unspecified asthma, uncomplicated: Secondary | ICD-10-CM | POA: Insufficient documentation

## 2015-05-03 DIAGNOSIS — Z79899 Other long term (current) drug therapy: Secondary | ICD-10-CM | POA: Insufficient documentation

## 2015-05-03 MED ORDER — ALBUTEROL SULFATE HFA 108 (90 BASE) MCG/ACT IN AERS
2.0000 | INHALATION_SPRAY | Freq: Once | RESPIRATORY_TRACT | Status: AC
  Administered 2015-05-03: 2 via RESPIRATORY_TRACT
  Filled 2015-05-03: qty 6.7

## 2015-05-03 MED ORDER — DRONABINOL 2.5 MG PO CAPS: 2.5000 mg | ORAL_CAPSULE | Freq: Two times a day (BID) | ORAL | Status: DC

## 2015-05-03 MED ORDER — CLONAZEPAM 0.5 MG PO TABS
1.0000 mg | ORAL_TABLET | ORAL | Status: AC
  Administered 2015-05-03: 1 mg via ORAL
  Filled 2015-05-03: qty 2

## 2015-05-03 MED ORDER — CLONAZEPAM 1 MG PO TABS: 1.0000 mg | ORAL_TABLET | Freq: Two times a day (BID) | ORAL | Status: DC | PRN

## 2015-05-03 NOTE — Care Management (Signed)
ED CM was contact concerning patient needing medication assistance. Patient is on HIV meds, patient [facility]. ED Cm [facility] to assist him with a sooner appt. Updated E. West PA-C in the ED she is in agreement.  No further ED CM needs .

## 2015-05-03 NOTE — Discharge Instructions (Signed)
Read the information below.  Use the prescribed medication as directed.  Please discuss all new medications with your pharmacist.  You may return to the Emergency Department at any time for worsening condition or any new symptoms that concern you.    If you develop fevers, uncontrolled pain, uncontrolled vomiting, cough, or difficulty breathing or swallowing return to the ER for a recheck.     It is very important that you follow up with primary care and infectious disease.  Please take your HIV medications and antibiotics as directed.     Medication Refill, Emergency Department We have refilled your medication today as a courtesy to you. It is best for your medical care, however, to take care of getting refills done through your primary caregiver's office. They have your records and can do a better job of follow-up than we can in the emergency department. On maintenance medications, we often only prescribe enough medications to get you by until you are able to see your regular caregiver. This is a more expensive way to refill medications. In the future, please plan for refills so that you will not have to use the emergency department for this. Thank you for your help. Your help allows us to better take care of the daily emergencies that enter our department. Document Released: 03/08/2004 Document Revised: 02/12/2012 Document Reviewed: 02/27/2014 [facility] Patient Information 2015 [place]. This information is not intended to replace advice given to you by your health care provider. Make sure you discuss any questions you have with your health care provider.

## 2015-05-03 NOTE — ED Provider Notes (Signed)
CSN: [id number]     Arrival date & time 05/03/15  1157 History  This chart was scribed for non-physician practitioner, Trixie Dredge, PA-C, working with Mancel Bale, MD by Charline Bills, ED Scribe. This patient was seen in room TR06C/TR06C and the patient's care was started at 12:48 PM.   Chief Complaint  Patient presents with  . Medication Refill   The history is provided by the patient. No language interpreter was used.   HPI Comments: Steven [name] is a 49 y.o. male, with a h/o AIDS, asthma, who presents to the Emergency Department for a refill of Klonopin, Marinol, Norco and albuterol inhaler. Pt was diagnosed with HIV in January 1985. His last CD4 was 200 in 03/2015. He was also seen on 03/12/15; he reports feeling worse since he was last seen but states that he does not want to repeat blood work. Pt reports associated vomiting, diarrhea, cough and night sweats that he describes as chronic. Pt has an upcoming appointment in July with ID. States he has been seen regularly in [place] but is now transferring care to [place].  He is taking all of his HIV medications including his antibiotics.    Past Medical History  Diagnosis Date  . HIV (human immunodeficiency virus infection)   . Hepatitis C   . Asthma   . AIDS   . Cerebral hemorrhage    Past Surgical History  Procedure Laterality Date  . Hernia repair    . Mandible surgery    . Laporotomy     Family History  Problem Relation Age of Onset  . CAD Mother   . Stroke Mother    History  Substance Use Topics  . Smoking status: Current Every Day Smoker -- 2.00 packs/day for 38 years    Types: Cigarettes  . Smokeless tobacco: Never Used  . Alcohol Use: Yes     Comment: 4 beers tonight    Review of Systems  Constitutional: Positive for diaphoresis (chronic night sweats).  Respiratory: Positive for cough (chronic).   Gastrointestinal: Positive for vomiting (chronic) and diarrhea (chronic).  Allergic/Immunologic:  Positive for immunocompromised state.   Allergies  Aspirin  Home Medications   Prior to Admission medications   Medication Sig Start Date End Date Taking? Authorizing Provider  albuterol (PROVENTIL HFA;VENTOLIN HFA) 108 (90 BASE) MCG/ACT inhaler Inhale 2 puffs into the lungs every 6 (six) hours as needed for wheezing or shortness of breath (cough). 11/17/13   Earley Favor, NP  benzonatate (TESSALON) 200 MG capsule Take 1 capsule (200 mg total) by mouth 3 (three) times daily as needed for cough. 03/23/15   Elpidio Anis, PA-C  chlordiazePOXIDE (LIBRIUM) 10 MG capsule Take 1 capsule (10 mg total) by mouth 3 (three) times daily. 03/23/15   Elpidio Anis, PA-C  clonazePAM (KLONOPIN) 1 MG tablet Take 1 mg by mouth 2 (two) times daily as needed for anxiety.    Historical Provider, MD  Darunavir Ethanolate (PREZISTA) 800 MG tablet Take 1 tablet (800 mg total) by mouth daily with breakfast. 11/17/13   Earley Favor, NP  dronabinol (MARINOL) 2.5 MG capsule Take 2.5 mg by mouth 2 (two) times daily. 03/12/15   Historical Provider, MD  emtricitabine-tenofovir (TRUVADA) 200-300 MG per tablet Take 1 tablet by mouth every morning. 11/17/13   Earley Favor, NP  fexofenadine (ALLEGRA) 180 MG tablet Take 180 mg by mouth daily.    Historical Provider, MD  HYDROcodone-acetaminophen (NORCO/VICODIN) 5-325 MG per tablet Take 1 tablet by mouth every 6 (six) hours  as needed. 03/12/15   Historical Provider, MD  hydroxypropyl methylcellulose (ISOPTO TEARS) 2.5 % ophthalmic solution Place 1 drop into both eyes 3 (three) times daily as needed for dry eyes.    Historical Provider, MD  Multiple Vitamin (MULTIVITAMIN WITH MINERALS) TABS tablet Take 1 tablet by mouth daily.    Historical Provider, MD  oxyCODONE (OXY IR/ROXICODONE) 5 MG immediate release tablet Take 5 mg by mouth every 4 (four) hours as needed for severe pain.    Historical Provider, MD  promethazine (PHENERGAN) 25 MG tablet Take 25 mg by mouth every 6 (six) hours as  needed for nausea or vomiting.    Historical Provider, MD  ritonavir (NORVIR) 100 MG capsule Take 1 capsule (100 mg total) by mouth every morning. 11/17/13   Earley FavorGail Schulz, NP  sertraline (ZOLOFT) 50 MG tablet Take 50 mg by mouth daily.    Historical Provider, MD  sulfamethoxazole-trimethoprim (BACTRIM DS) 800-160 MG per tablet Take 1 tablet by mouth daily. 11/17/13   Earley FavorGail Schulz, NP   Triage: BP 157/100 mmHg  Pulse 80  Temp(Src) 98.3 F (36.8 C) (Oral)  Resp 22  SpO2 98% Physical Exam  Constitutional: He appears well-developed and well-nourished. No distress.  HENT:  Head: Normocephalic and atraumatic.  Neck: Neck supple.  Pulmonary/Chest: Effort normal.  Neurological: He is alert. He exhibits normal muscle tone.  Skin: He is not diaphoretic.  Psychiatric: He has a normal mood and affect. His behavior is normal.  Nursing note and vitals reviewed.  ED Course  Procedures (including critical care time) DIAGNOSTIC STUDIES: Oxygen Saturation is 98% on RA, normal by my interpretation.    COORDINATION OF CARE: 12:56 PM-Discussed treatment plan which includes medication refill with pt at bedside and pt agreed to plan. Pt refused additional work-up.   Labs Review Labs Reviewed - No data to display  Imaging Review No results found.   EKG Interpretation None      MDM   Final diagnoses:  Medication refill    Afebrile, nontoxic patient with request for medication refill. He [facility] the process [facility] [place].   He has most of his medications but is out of his narcotic, benzo, albuterol inhaler, and marinol.  States he is wasting away from AIDS.  Notes chronic symptoms that are gradually worsening but he declines any workup, further testing, or possible admission to the hospital.  I contacted case management to attempt to get him seen more quickly by PCP if not ID. Case manager to contact patient to assist with resources.   D/C home with #6  klonopin, albuterol inhaler, marinol.  I discussed with him that his primary care provider should control and prescribe his narcotics as well as his benzodiazepine.   Discussed result, findings, treatment, and follow up  with patient.  Pt given return precautions.  Pt verbalizes understanding and agrees with plan.       I personally performed the services described in this documentation, which was scribed in my presence. The recorded information has been reviewed and is accurate.    Trixie Dredgemily Shalah Estelle, PA-C 05/03/15 1425  Mancel BaleElliott Wentz, MD 05/03/15 [phone]

## 2015-05-03 NOTE — ED Notes (Signed)
Pt in requesting refill of multiple regular medications, states he wont be able to see his new PCP until later in June and is out of medication now

## 2015-05-11 ENCOUNTER — Encounter (HOSPITAL_COMMUNITY): Payer: Self-pay | Admitting: Emergency Medicine

## 2015-05-11 ENCOUNTER — Emergency Department (INDEPENDENT_AMBULATORY_CARE_PROVIDER_SITE_OTHER)
Admission: EM | Admit: 2015-05-11 | Discharge: 2015-05-11 | Disposition: A | Discharge status: Home / Self Care | Payer: Self-pay | Source: Home / Self Care | Attending: Family Medicine | Admitting: Family Medicine

## 2015-05-11 DIAGNOSIS — G8929 Other chronic pain: Secondary | ICD-10-CM

## 2015-05-11 DIAGNOSIS — R569 Unspecified convulsions: Secondary | ICD-10-CM

## 2015-05-11 DIAGNOSIS — Z21 Asymptomatic human immunodeficiency virus [HIV] infection status: Secondary | ICD-10-CM

## 2015-05-11 DIAGNOSIS — B20 Human immunodeficiency virus [HIV] disease: Secondary | ICD-10-CM

## 2015-05-11 MED ORDER — HYDROCODONE-ACETAMINOPHEN 5-325 MG PO TABS: 1.0000 | ORAL_TABLET | Freq: Four times a day (QID) | ORAL | Status: DC | PRN

## 2015-05-11 MED ORDER — CLONAZEPAM 1 MG PO TABS: 1.0000 mg | ORAL_TABLET | Freq: Two times a day (BID) | ORAL | Status: DC | PRN

## 2015-05-11 NOTE — ED Provider Notes (Signed)
CSN: [id number]     Arrival date & time 05/11/15  1657 History   First MD Initiated Contact with Patient 05/11/15 1818     Chief Complaint  Patient presents with  . Medication Refill   (Consider location/radiation/quality/duration/timing/severity/associated sxs/prior Treatment) HPI Patient with multiple chronic medical conditions including HIV, hepatitis, history of traumatic brain injury causing cerebral hemorrhage, chronic pain, and seizure condition. Patient states that his seizures very weakly after coming off his Klonopin. His last Klonopin this morning. States he recently moved to [place] and is trying to establish care with a new primary care physician. Patient states he has an appointment on July 11 at 10am w/ ID clinic. States that he is taking on his HIV medications as prescribed. Denies any recent seizure, fever, nausea, vomiting, neck stiffness, headache, chest pain, shortness breath, palpitations above the baseline.       Past Medical History  Diagnosis Date  . HIV (human immunodeficiency virus infection)   . Hepatitis C   . Asthma   . AIDS   . Cerebral hemorrhage    Past Surgical History  Procedure Laterality Date  . Hernia repair    . Mandible surgery    . Laporotomy     Family History  Problem Relation Age of Onset  . CAD Mother   . Stroke Mother    History  Substance Use Topics  . Smoking status: Current Every Day Smoker -- 2.00 packs/day for 38 years    Types: Cigarettes  . Smokeless tobacco: Never Used  . Alcohol Use: Yes     Comment: 4 beers tonight    Review of Systems Per HPI with all other pertinent systems negative.   Allergies  Aspirin  Home Medications   Prior to Admission medications   Medication Sig Start Date End Date Taking? Authorizing Provider  albuterol (PROVENTIL HFA;VENTOLIN HFA) 108 (90 BASE) MCG/ACT inhaler Inhale 2 puffs into the lungs every 6 (six) hours as needed for wheezing or shortness of breath (cough). 11/17/13   Earley FavorGail  Schulz, NP  benzonatate (TESSALON) 200 MG capsule Take 1 capsule (200 mg total) by mouth 3 (three) times daily as needed for cough. 03/23/15   Elpidio AnisShari Upstill, PA-C  chlordiazePOXIDE (LIBRIUM) 10 MG capsule Take 1 capsule (10 mg total) by mouth 3 (three) times daily. 03/23/15   Elpidio AnisShari Upstill, PA-C  clonazePAM (KLONOPIN) 1 MG tablet Take 1 tablet (1 mg total) by mouth 2 (two) times daily as needed for anxiety. 05/11/15   Ozella Rocksavid J Merrell, MD  Darunavir Ethanolate (PREZISTA) 800 MG tablet Take 1 tablet (800 mg total) by mouth daily with breakfast. 11/17/13   Earley FavorGail Schulz, NP  dronabinol (MARINOL) 2.5 MG capsule Take 1 capsule (2.5 mg total) by mouth 2 (two) times daily. 05/03/15   Trixie DredgeEmily West, PA-C  emtricitabine-tenofovir (TRUVADA) 200-300 MG per tablet Take 1 tablet by mouth every morning. 11/17/13   Earley FavorGail Schulz, NP  fexofenadine (ALLEGRA) 180 MG tablet Take 180 mg by mouth daily.    Historical Provider, MD  HYDROcodone-acetaminophen (NORCO/VICODIN) 5-325 MG per tablet Take 1 tablet by mouth every 6 (six) hours as needed. 05/11/15   Ozella Rocksavid J Merrell, MD  hydroxypropyl methylcellulose (ISOPTO TEARS) 2.5 % ophthalmic solution Place 1 drop into both eyes 3 (three) times daily as needed for dry eyes.    Historical Provider, MD  Multiple Vitamin (MULTIVITAMIN WITH MINERALS) TABS tablet Take 1 tablet by mouth daily.    Historical Provider, MD  oxyCODONE (OXY IR/ROXICODONE) 5 MG immediate release tablet  Take 5 mg by mouth every 4 (four) hours as needed for severe pain.    Historical Provider, MD  promethazine (PHENERGAN) 25 MG tablet Take 25 mg by mouth every 6 (six) hours as needed for nausea or vomiting.    Historical Provider, MD  ritonavir (NORVIR) 100 MG capsule Take 1 capsule (100 mg total) by mouth every morning. 11/17/13   Earley Favor, NP  sertraline (ZOLOFT) 50 MG tablet Take 50 mg by mouth daily.    Historical Provider, MD  sulfamethoxazole-trimethoprim (BACTRIM DS) 800-160 MG per tablet Take 1 tablet by mouth  daily. 11/17/13   Earley Favor, NP   There were no vitals taken for this visit. Physical Exam Physical Exam  Constitutional: oriented to person, place, and time. appears well-developed and well-nourished. No distress.  HENT:  Head: Normocephalic and atraumatic.  Eyes: EOMI. PERRL.  Neck: Normal range of motion.  Musculoskeletal: Normal range of motion. Non ttp, no effusion.  Neurological: alert and oriented to person, place, and time.  Skin: Skin is warm. No rash noted. non diaphoretic.  Psychiatric: normal mood and affect. behavior is normal. Judgment and thought content normal.   ED Course  Procedures (including critical care time) Labs Review Labs Reviewed - No data to display  Imaging Review No results found.   MDM   1. HIV (human immunodeficiency virus infection)   2. Chronic pain   3. Seizures    Call patient's pharmacy clarified recent prescriptions for narcotics. Per pharmacy patient has only had 6 Klonopin prescribed since 05/03/2015. Also research patient on the [place] controlled substance database which showed last refill of Norco quantity 60 was on 03/12/2015. Discussed inability to prescribe these medications further for patient outside of single prescription given for each at this point in time. Patient provided with 60 Klonopin and 30 Norco should be sufficient to get patient to his appointment. No further controlled substances will be prescribed from our office. Patient aware.    Ozella Rocks, MD 05/11/15 [phone]

## 2015-05-11 NOTE — ED Notes (Signed)
Pt state he is out of his medication refills for norco and clonazepam. States want be able to see pcp till July.

## 2015-05-11 NOTE — Discharge Instructions (Signed)
Your klonopin and adn norco were refilled. Please only take the norco daily and the klonopin twice daily You will not be able to get these refilled here again and further refills must come from your new primary doctor

## 2015-06-05 ENCOUNTER — Emergency Department (HOSPITAL_COMMUNITY)
Admission: EM | Admit: 2015-06-05 | Discharge: 2015-06-05 | Disposition: A | Discharge status: Home / Self Care | Payer: Self-pay

## 2015-06-05 ENCOUNTER — Encounter (HOSPITAL_COMMUNITY): Payer: Self-pay | Admitting: *Deleted

## 2015-06-05 NOTE — ED Notes (Signed)
The pt came in ambulatory from [facility].  Un-co-operative.  Police and security called to traige.    He suddenly said ive called a cab and  im leaving.  Police and security follwed the pt out of the triage area toward the outside door

## 2015-06-14 ENCOUNTER — Encounter (HOSPITAL_COMMUNITY): Payer: Self-pay | Admitting: Emergency Medicine

## 2015-06-14 ENCOUNTER — Emergency Department (HOSPITAL_COMMUNITY): Payer: Self-pay

## 2015-06-14 ENCOUNTER — Emergency Department (HOSPITAL_COMMUNITY)
Admission: EM | Admit: 2015-06-14 | Discharge: 2015-06-14 | Disposition: A | Discharge status: Home / Self Care | Payer: Self-pay | Attending: Emergency Medicine | Admitting: Emergency Medicine

## 2015-06-14 ENCOUNTER — Ambulatory Visit (INDEPENDENT_AMBULATORY_CARE_PROVIDER_SITE_OTHER): Payer: Self-pay | Admitting: Infectious Diseases

## 2015-06-14 DIAGNOSIS — Y929 Unspecified place or not applicable: Secondary | ICD-10-CM | POA: Insufficient documentation

## 2015-06-14 DIAGNOSIS — T148XXA Other injury of unspecified body region, initial encounter: Secondary | ICD-10-CM

## 2015-06-14 DIAGNOSIS — S7012XA Contusion of left thigh, initial encounter: Secondary | ICD-10-CM | POA: Insufficient documentation

## 2015-06-14 DIAGNOSIS — R05 Cough: Secondary | ICD-10-CM | POA: Insufficient documentation

## 2015-06-14 DIAGNOSIS — S8011XA Contusion of right lower leg, initial encounter: Secondary | ICD-10-CM | POA: Insufficient documentation

## 2015-06-14 DIAGNOSIS — R197 Diarrhea, unspecified: Secondary | ICD-10-CM | POA: Insufficient documentation

## 2015-06-14 DIAGNOSIS — Z79899 Other long term (current) drug therapy: Secondary | ICD-10-CM | POA: Insufficient documentation

## 2015-06-14 DIAGNOSIS — R0789 Other chest pain: Secondary | ICD-10-CM | POA: Insufficient documentation

## 2015-06-14 DIAGNOSIS — X58XXXA Exposure to other specified factors, initial encounter: Secondary | ICD-10-CM | POA: Insufficient documentation

## 2015-06-14 DIAGNOSIS — Z8679 Personal history of other diseases of the circulatory system: Secondary | ICD-10-CM | POA: Insufficient documentation

## 2015-06-14 DIAGNOSIS — Z792 Long term (current) use of antibiotics: Secondary | ICD-10-CM | POA: Insufficient documentation

## 2015-06-14 DIAGNOSIS — Y999 Unspecified external cause status: Secondary | ICD-10-CM | POA: Insufficient documentation

## 2015-06-14 DIAGNOSIS — R059 Cough, unspecified: Secondary | ICD-10-CM

## 2015-06-14 DIAGNOSIS — Z720 Tobacco use: Secondary | ICD-10-CM | POA: Insufficient documentation

## 2015-06-14 DIAGNOSIS — R52 Pain, unspecified: Secondary | ICD-10-CM

## 2015-06-14 DIAGNOSIS — B20 Human immunodeficiency virus [HIV] disease: Secondary | ICD-10-CM | POA: Insufficient documentation

## 2015-06-14 DIAGNOSIS — Y939 Activity, unspecified: Secondary | ICD-10-CM | POA: Insufficient documentation

## 2015-06-14 DIAGNOSIS — J45909 Unspecified asthma, uncomplicated: Secondary | ICD-10-CM | POA: Insufficient documentation

## 2015-06-14 DIAGNOSIS — Z8619 Personal history of other infectious and parasitic diseases: Secondary | ICD-10-CM | POA: Insufficient documentation

## 2015-06-14 LAB — CBC
HCT: 36 % — ABNORMAL LOW (ref 39.0–52.0)
Hemoglobin: 12.7 g/dL — ABNORMAL LOW (ref 13.0–17.0)
MCH: 33.5 pg (ref 26.0–34.0)
MCHC: 35.3 g/dL (ref 30.0–36.0)
MCV: 95 fL (ref 78.0–100.0)
PLATELETS: 57 10*3/uL — AB (ref 150–400)
RBC: 3.79 MIL/uL — AB (ref 4.22–5.81)
RDW: 12.5 % (ref 11.5–15.5)
WBC: 1.4 10*3/uL — AB (ref 4.0–10.5)

## 2015-06-14 LAB — I-STAT TROPONIN, ED: Troponin i, poc: 0.01 ng/mL (ref 0.00–0.08)

## 2015-06-14 LAB — DIFFERENTIAL
Basophils Absolute: 0 K/uL (ref 0.0–0.1)
Basophils Relative: 2 % — ABNORMAL HIGH (ref 0–1)
Eosinophils Absolute: 0.1 K/uL (ref 0.0–0.7)
Eosinophils Relative: 3 % (ref 0–5)
Lymphocytes Relative: 33 % (ref 12–46)
Lymphs Abs: 0.5 K/uL — ABNORMAL LOW (ref 0.7–4.0)
Monocytes Absolute: 0.3 K/uL (ref 0.1–1.0)
Monocytes Relative: 24 % — ABNORMAL HIGH (ref 3–12)
Neutro Abs: 0.5 K/uL — ABNORMAL LOW (ref 1.7–7.7)
Neutrophils Relative %: 38 % — ABNORMAL LOW (ref 43–77)

## 2015-06-14 LAB — COMPREHENSIVE METABOLIC PANEL
ALT: 156 U/L — AB (ref 17–63)
ANION GAP: 8 (ref 5–15)
AST: 231 U/L — ABNORMAL HIGH (ref 15–41)
Albumin: 3.9 g/dL (ref 3.5–5.0)
Alkaline Phosphatase: 87 U/L (ref 38–126)
BILIRUBIN TOTAL: 1.4 mg/dL — AB (ref 0.3–1.2)
BUN: 8 mg/dL (ref 6–20)
CHLORIDE: 97 mmol/L — AB (ref 101–111)
CO2: 26 mmol/L (ref 22–32)
Calcium: 8.6 mg/dL — ABNORMAL LOW (ref 8.9–10.3)
Creatinine, Ser: 0.96 mg/dL (ref 0.61–1.24)
GFR calc Af Amer: >60 mL/min (ref >60)
GFR calc non Af Amer: >60 mL/min (ref >60)
Glucose, Bld: 121 mg/dL — ABNORMAL HIGH (ref 65–99)
Potassium: 3.6 mmol/L (ref 3.5–5.1)
Sodium: 131 mmol/L — ABNORMAL LOW (ref 135–145)
Total Protein: 7.9 g/dL (ref 6.5–8.1)

## 2015-06-14 LAB — URINALYSIS, ROUTINE W REFLEX MICROSCOPIC
Bilirubin Urine: NEGATIVE
Glucose, UA: NEGATIVE mg/dL
Hgb urine dipstick: NEGATIVE
Ketones, ur: NEGATIVE mg/dL
Leukocytes, UA: NEGATIVE
Nitrite: NEGATIVE
Protein, ur: NEGATIVE mg/dL
Specific Gravity, Urine: 1.012 (ref 1.005–1.030)
Urobilinogen, UA: 1 mg/dL (ref 0.0–1.0)
pH: 6.5 (ref 5.0–8.0)

## 2015-06-14 LAB — LIPASE, BLOOD: Lipase: 28 U/L (ref 22–51)

## 2015-06-14 MED ORDER — ONDANSETRON HCL 4 MG/2ML IJ SOLN
4.0000 mg | Freq: Once | INTRAMUSCULAR | Status: AC
  Administered 2015-06-14: 4 mg via INTRAVENOUS
  Filled 2015-06-14: qty 2

## 2015-06-14 MED ORDER — SODIUM CHLORIDE 0.9 % IV BOLUS (SEPSIS)
1000.0000 mL | Freq: Once | INTRAVENOUS | Status: AC
  Administered 2015-06-14: 1000 mL via INTRAVENOUS

## 2015-06-14 MED ORDER — ALBUTEROL SULFATE HFA 108 (90 BASE) MCG/ACT IN AERS
1.0000 | INHALATION_SPRAY | Freq: Four times a day (QID) | RESPIRATORY_TRACT | Status: DC | PRN
  Administered 2015-06-14: 2 via RESPIRATORY_TRACT
  Filled 2015-06-14: qty 6.7

## 2015-06-14 MED ORDER — OXYCODONE HCL 5 MG PO TABS: 5.0000 mg | ORAL_TABLET | Freq: Four times a day (QID) | ORAL | Status: DC | PRN

## 2015-06-14 MED ORDER — MORPHINE SULFATE 4 MG/ML IJ SOLN
4.0000 mg | Freq: Once | INTRAMUSCULAR | Status: AC
  Administered 2015-06-14: 4 mg via INTRAVENOUS
  Filled 2015-06-14: qty 1

## 2015-06-14 NOTE — ED Notes (Signed)
The patient is aware that a urine specimen is needed. 

## 2015-06-14 NOTE — ED Notes (Signed)
Patient placed on neutropenic precautions

## 2015-06-14 NOTE — ED Notes (Signed)
Pt. Stated, I have full blown AIDS and I've had nausea, vomiting, diarrhea and dizziness for a week, I feel really dizzy.

## 2015-06-14 NOTE — ED Notes (Signed)
Pa notified of Patient's WBC 1.4

## 2015-06-14 NOTE — ED Provider Notes (Signed)
CSN: [id number]     Arrival date & time 06/14/15  1021 History   First MD Initiated Contact with Patient 06/14/15 1120     Chief Complaint  Patient presents with  . Nausea  . Emesis  . Diarrhea  . Dizziness     (Consider location/radiation/quality/duration/timing/severity/associated sxs/prior Treatment) HPI Bayler Gehrig  Is a 49 year old male with past medical history of HIV/ AIDS with "low CD4 count" , unable to verify last CD4 count assessment who presents the ER with complaint of bilateral leg pain. Patient initially complained of nausea, vomiting, diarrhea , however reports that these symptoms are ongoing for him, and noncontributory today. Patient states his main concern is an area of ecchymosis on his upper lateral left leg, and right anterior lower extremity. Patient states these signs and symptoms have been present for approximately one week. Patient reports pain with range of motion of his legs, and palpation of these areas. Patient denies any specific trauma to these areas, denies falls or exertional activity. Patient also is stating he he has had some anterior chest wall pain for the past 2 days 10. Patient states she is also had a cough for the past 2 days. Patient reports his pain is reproducible with deep inspiration and with coughing. Patient denies shortness of breath, fever, headache, blurred vision, weakness,   Past Medical History  Diagnosis Date  . HIV (human immunodeficiency virus infection)   . Hepatitis C   . Asthma   . AIDS   . Cerebral hemorrhage    Past Surgical History  Procedure Laterality Date  . Hernia repair    . Mandible surgery    . Laporotomy     Family History  Problem Relation Age of Onset  . CAD Mother   . Stroke Mother    History  Substance Use Topics  . Smoking status: Current Every Day Smoker -- 2.00 packs/day for 38 years    Types: Cigarettes  . Smokeless tobacco: Never Used  . Alcohol Use: Yes     Comment: 4 beers tonight     Review of Systems  Constitutional: Negative for fever.  HENT: Negative for trouble swallowing.   Eyes: Negative for visual disturbance.  Respiratory: Positive for cough. Negative for shortness of breath.   Cardiovascular: Negative for chest pain.  Gastrointestinal: Negative for nausea, vomiting and abdominal pain.  Genitourinary: Negative for dysuria.  Musculoskeletal: Negative for neck pain.       Chest wall pain  Skin: Negative for rash.  Neurological: Negative for dizziness, weakness and numbness.  Psychiatric/Behavioral: Negative.       Allergies  Aspirin  Home Medications   Prior to Admission medications   Medication Sig Start Date End Date Taking? Authorizing Provider  albuterol (PROVENTIL HFA;VENTOLIN HFA) 108 (90 BASE) MCG/ACT inhaler Inhale 2 puffs into the lungs every 6 (six) hours as needed for wheezing or shortness of breath (cough). 11/17/13  Yes Earley Favor, NP  clonazePAM (KLONOPIN) 1 MG tablet Take 1 tablet (1 mg total) by mouth 2 (two) times daily as needed for anxiety. 05/11/15  Yes Ozella Rocks, MD  Darunavir Ethanolate (PREZISTA) 800 MG tablet Take 1 tablet (800 mg total) by mouth daily with breakfast. 11/17/13  Yes Earley Favor, NP  dronabinol (MARINOL) 2.5 MG capsule Take 1 capsule (2.5 mg total) by mouth 2 (two) times daily. 05/03/15  Yes Trixie Dredge, PA-C  emtricitabine-tenofovir (TRUVADA) 200-300 MG per tablet Take 1 tablet by mouth every morning. 11/17/13  Yes Earley Favor, NP  fexofenadine (ALLEGRA) 180 MG tablet Take 180 mg by mouth daily.   Yes Historical Provider, MD  hydroxypropyl methylcellulose (ISOPTO TEARS) 2.5 % ophthalmic solution Place 1 drop into both eyes 3 (three) times daily as needed for dry eyes.   Yes Historical Provider, MD  Multiple Vitamin (MULTIVITAMIN WITH MINERALS) TABS tablet Take 1 tablet by mouth daily.   Yes Historical Provider, MD  ritonavir (NORVIR) 100 MG capsule Take 1 capsule (100 mg total) by mouth every morning. 11/17/13   Yes Earley FavorGail Schulz, NP  sertraline (ZOLOFT) 50 MG tablet Take 50 mg by mouth daily.   Yes Historical Provider, MD  sulfamethoxazole-trimethoprim (BACTRIM DS) 800-160 MG per tablet Take 1 tablet by mouth daily. 11/17/13  Yes Earley FavorGail Schulz, NP  benzonatate (TESSALON) 200 MG capsule Take 1 capsule (200 mg total) by mouth 3 (three) times daily as needed for cough. Patient not taking: Reported on 06/14/2015 03/23/15   Elpidio AnisShari Upstill, PA-C  chlordiazePOXIDE (LIBRIUM) 10 MG capsule Take 1 capsule (10 mg total) by mouth 3 (three) times daily. Patient not taking: Reported on 06/14/2015 03/23/15   Elpidio AnisShari Upstill, PA-C  HYDROcodone-acetaminophen (NORCO/VICODIN) 5-325 MG per tablet Take 1 tablet by mouth every 6 (six) hours as needed. Patient not taking: Reported on 06/14/2015 05/11/15   Ozella Rocksavid J Merrell, MD  oxyCODONE (ROXICODONE) 5 MG immediate release tablet Take 1 tablet (5 mg total) by mouth every 6 (six) hours as needed for severe pain. 06/14/15   Ladona MowJoe Toshi Ishii, PA-C  promethazine (PHENERGAN) 25 MG tablet Take 25 mg by mouth every 6 (six) hours as needed for nausea or vomiting.    Historical Provider, MD   BP 171/99 mmHg  Pulse 56  Temp(Src) 97.6 F (36.4 C) (Oral)  Resp 16  SpO2 100% Physical Exam  Constitutional: He is oriented to person, place, and time. He appears well-developed and well-nourished. No distress.  HENT:  Head: Normocephalic and atraumatic.  Mouth/Throat: Oropharynx is clear and moist. No oropharyngeal exudate.  Eyes: Right eye exhibits no discharge. Left eye exhibits no discharge. No scleral icterus.  Neck: Normal range of motion.  Cardiovascular: Normal rate, regular rhythm and normal heart sounds.   No murmur heard. Pulmonary/Chest: Effort normal and breath sounds normal. No respiratory distress.  Abdominal: Soft. There is no tenderness.  Musculoskeletal: Normal range of motion. He exhibits no edema or tenderness.  Patient has large, 13 x 15 hematoma/ecchymosis noted to the lateral aspect  of his left upper thigh. There is a mild superficial thrombus palpable. There is no palpable mass. Also noted is ecchymosis to the anterior aspect of his right shin. PT pulses 2+ bilaterally. Distal sensation intact. Motor strength 5 out of 5 at the hips, knees, ankles bilaterally.  Neurological: He is alert and oriented to person, place, and time. No cranial nerve deficit. Coordination normal.  Skin: Skin is warm and dry. No rash noted. He is not diaphoretic.  Psychiatric: He has a normal mood and affect.  Nursing note and vitals reviewed.   ED Course  Procedures (including critical care time) Labs Review Labs Reviewed  COMPREHENSIVE METABOLIC PANEL - Abnormal; Notable for the following:    Sodium 131 (*)    Chloride 97 (*)    Glucose, Bld 121 (*)    Calcium 8.6 (*)    AST 231 (*)    ALT 156 (*)    Total Bilirubin 1.4 (*)    All other components within normal limits  CBC - Abnormal; Notable for the following:  WBC 1.4 (*)    RBC 3.79 (*)    Hemoglobin 12.7 (*)    HCT 36.0 (*)    Platelets 57 (*)    All other components within normal limits  URINALYSIS, ROUTINE W REFLEX MICROSCOPIC (NOT AT [facility]) - Abnormal; Notable for the following:    Color, Urine AMBER (*)    All other components within normal limits  DIFFERENTIAL - Abnormal; Notable for the following:    Neutro Abs 0.5 (*)    Lymphs Abs 0.5 (*)    Neutrophils Relative % 38 (*)    Monocytes Relative 24 (*)    Basophils Relative 2 (*)    All other components within normal limits  LIPASE, BLOOD  I-STAT TROPOININ, ED    Imaging Review Dg Chest 2 View  06/14/2015   CLINICAL DATA:  Chest pain and shortness of breath. Bilateral leg pain.  EXAM: CHEST  2 VIEW  COMPARISON:  02/05/2014  FINDINGS: The heart size and mediastinal contours are within normal limits. Both lungs are clear. The visualized skeletal structures are unremarkable.  IMPRESSION: Normal exam.   Electronically Signed   By: Francene Boyers M.D.   On: 06/14/2015  13:36   Dg Hip Unilat With Pelvis 2-3 Views Left  06/14/2015   CLINICAL DATA:  LEFT hip pain for 1 week. Bruising on the proximal lateral femur. No known injury.  EXAM: DG HIP (WITH OR WITHOUT PELVIS) 2-3V LEFT  COMPARISON:  None.  FINDINGS: Pelvic rings are intact. Atherosclerotic calcifications are present in the region of the RIGHT femoral artery. The obturator rings appear intact. Lumbar spondylosis is present with asymmetric LEFT-sided L4-L5 disc space collapse. SI joints appear normal. LEFT hip appears normal.  IMPRESSION: Negative.   Electronically Signed   By: Andreas Newport M.D.   On: 06/14/2015 15:19     EKG Interpretation   Date/Time:  Monday June 14 2015 12:18:13 EDT Ventricular Rate:  54 PR Interval:  168 QRS Duration: 97 QT Interval:  469 QTC Calculation: 444 R Axis:   41 Text Interpretation:  Sinus rhythm Borderline repolarization abnormality  No significant change since last tracing Confirmed by Anitra Lauth  MD,  Alphonzo Lemmings ([id number]) on 06/14/2015 1:40:57 PM      MDM   Final diagnoses:  Cough  Pain  Hematoma    Patient here with main concern of leg pain. Patient reported having some nausea, vomiting, diarrhea, however she states this is an ongoing issue for him and not different from his baseline today. The leg pain is noted to be associated with areas of ecchymosis. These areas are superficial in appearance, do not appear to be DVT. There is no concern for vascular compromise. Likely diffuse bruising from thrombocytopenia.   Chest wall pain Chest discomfort is atypical in nature, only present with coughing and deep inspiration. Patient reports productive cough for the past several days. Chest x-ray unremarkable for acute pathology. Troponin negative after greater than 6 hours after onset of symptoms. Wells criteria 0 for PE. Likely mild bronchitis versus upper respiratory infection. Encouraged continued use of albuterol inhaler, over-the-counter cough medicines.  Nausea,  vomiting, diarrhea These are at baseline for patient, patient does not appear to be overly dehydrated by labs or on exam. Fluid therapy given here, symptomatic therapy given, this is not appear to be an acute process for patient based on physical or history.  Labs and imaging appear to be at baseline for patient. There is no evidence of acute pathology. Patient stable for discharge at  this time. Strongly encouraged patient follow up with ID clinic. Return precautions discussed, patient verbalizes understanding and agreement of this plan.  BP 171/99 mmHg  Pulse 56  Temp(Src) 97.6 F (36.4 C) (Oral)  Resp 16  SpO2 100%  Signed,  Ladona Mow, PA-C 5:04 PM  Patient seen and discussed with Dr. Gwyneth Sprout, MD    Ladona Mow, PA-C 06/14/15 1704  Ladona Mow, PA-C 06/14/15 1707  Gwyneth Sprout, MD 06/14/15 2106

## 2015-06-14 NOTE — Discharge Instructions (Signed)
Hematoma A hematoma is a collection of blood under the skin, in an organ, in a body space, in a joint space, or in other tissue. The blood can clot to form a lump that you can see and feel. The lump is often firm and may sometimes become sore and tender. Most hematomas get better in a few days to weeks. However, some hematomas may be serious and require medical care. Hematomas can range in size from very small to very large. CAUSES  A hematoma can be caused by a blunt or penetrating injury. It can also be caused by spontaneous leakage from a blood vessel under the skin. Spontaneous leakage from a blood vessel is more likely to occur in older people, especially those taking blood thinners. Sometimes, a hematoma can develop after certain medical procedures. SIGNS AND SYMPTOMS   A firm lump on the body.  Possible pain and tenderness in the area.  Bruising.Blue, dark blue, purple-red, or yellowish skin may appear at the site of the hematoma if the hematoma is close to the surface of the skin. For hematomas in deeper tissues or body spaces, the signs and symptoms may be subtle. For example, an intra-abdominal hematoma may cause abdominal pain, weakness, fainting, and shortness of breath. An intracranial hematoma may cause a headache or symptoms such as weakness, trouble speaking, or a change in consciousness. DIAGNOSIS  A hematoma can usually be diagnosed based on your medical history and a physical exam. Imaging tests may be needed if your health care provider suspects a hematoma in deeper tissues or body spaces, such as the abdomen, head, or chest. These tests may include ultrasonography or a CT scan.  TREATMENT  Hematomas usually go away on their own over time. Rarely does the blood need to be drained out of the body. Large hematomas or those that may affect vital organs will sometimes need surgical drainage or monitoring. HOME CARE INSTRUCTIONS   Apply ice to the injured area:   Put ice in a  plastic bag.   Place a towel between your skin and the bag.   Leave the ice on for 20 minutes, 2-3 times a day for the first 1 to 2 days.   After the first 2 days, switch to using warm compresses on the hematoma.   Elevate the injured area to help decrease pain and swelling. Wrapping the area with an elastic bandage may also be helpful. Compression helps to reduce swelling and promotes shrinking of the hematoma. Make sure the bandage is not wrapped too tight.   If your hematoma is on a lower extremity and is painful, crutches may be helpful for a couple days.   Only take over-the-counter or prescription medicines as directed by your health care provider. SEEK IMMEDIATE MEDICAL CARE IF:   You have increasing pain, or your pain is not controlled with medicine.   You have a fever.   You have worsening swelling or discoloration.   Your skin over the hematoma breaks or starts bleeding.   Your hematoma is in your chest or abdomen and you have weakness, shortness of breath, or a change in consciousness.  Your hematoma is on your scalp (caused by a fall or injury) and you have a worsening headache or a change in alertness or consciousness. MAKE SURE YOU:   Understand these instructions.  Will watch your condition.  Will get help right away if you are not doing well or get worse. Document Released: 07/04/2004 Document Revised: 07/23/2013 Document Reviewed: 04/30/2013  ExitCare Patient Information 2015 [place], [place]. This information is not intended to replace advice given to you by your health care provider. Make sure you discuss any questions you have with your health care provider.  Thrombocytopenia Thrombocytopenia is a condition in which there is an abnormally small number of platelets in your blood. Platelets are also called thrombocytes. Platelets are needed for blood clotting. CAUSES Thrombocytopenia is caused by:   Decreased production of platelets. This can be  caused by:  Aplastic anemia in which your bone marrow quits making blood cells.  Cancer in the bone marrow.  Use of certain medicines, including chemotherapy.  Infection in the bone marrow.  Heavy alcohol consumption.  Increased destruction of platelets. This can be caused by:  Certain immune diseases.  Use of certain drugs.  Certain blood clotting disorders.  Certain inherited disorders.  Certain bleeding disorders.  Pregnancy.  Having an enlarged spleen (hypersplenism). In hypersplenism, the spleen gathers up platelets from circulation. This means the platelets are not available to help with blood clotting. The spleen can enlarge due to cirrhosis or other conditions. SYMPTOMS  The symptoms of thrombocytopenia are side effects of poor blood clotting. Some of these are:  Abnormal bleeding.  Nosebleeds.  Heavy menstrual periods.  Blood in the urine or stools.  Purpura. This is a purplish discoloration in the skin produced by small bleeding vessels near the surface of the skin.  Bruising.  A rash that may be petechial. This looks like pinpoint, purplish-red spots on the skin and mucous membranes. It is caused by bleeding from small blood vessels (capillaries). DIAGNOSIS  Your caregiver will make this diagnosis based on your exam and blood tests. Sometimes, a bone marrow study is done to look for the original cells (megakaryocytes) that make platelets. TREATMENT  Treatment depends on the cause of the condition.  Medicines may be given to help protect your platelets from being destroyed.  In some cases, a replacement (transfusion) of platelets may be required to stop or prevent bleeding.  Sometimes, the spleen must be surgically removed. HOME CARE INSTRUCTIONS   Check the skin and linings inside your mouth for bruising or bleeding as directed by your caregiver.  Check your sputum, urine, and stool for blood as directed by your caregiver.  Do not return to any  activities that could cause bumps or bruises until your caregiver says it is okay.  Take extra care not to cut yourself when shaving or when using scissors, needles, knives, and other tools.  Take extra care not to burn yourself when ironing or cooking.  Ask your caregiver if it is okay for you to drink alcohol.  Only take over-the-counter or prescription medicines as directed by your caregiver.  Notify all your caregivers, including dentists and eye doctors, about your condition. SEEK IMMEDIATE MEDICAL CARE IF:   You develop active bleeding from anywhere in your body.  You develop unexplained bruising or bleeding.  You have blood in your sputum, urine, or stool. MAKE SURE YOU:  Understand these instructions.  Will watch your condition.  Will get help right away if you are not doing well or get worse. Document Released: 11/20/2005 Document Revised: 02/12/2012 Document Reviewed: 09/22/2011 [facility] Patient Information 2015 [place], [place]. This information is not intended to replace advice given to you by your health care provider. Make sure you discuss any questions you have with your health care provider.  Chest Wall Pain Chest wall pain is pain in or around the bones and muscles of  your chest. It may take up to 6 weeks to get better. It may take longer if you must stay physically active in your work and activities.  CAUSES  Chest wall pain may happen on its own. However, it may be caused by:  A viral illness like the flu.  Injury.  Coughing.  Exercise.  Arthritis.  Fibromyalgia.  Shingles. HOME CARE INSTRUCTIONS   Avoid overtiring physical activity. Try not to strain or perform activities that cause pain. This includes any activities using your chest or your abdominal and side muscles, especially if heavy weights are used.  Put ice on the sore area.  Put ice in a plastic bag.  Place a towel between your skin and the bag.  Leave the ice on for 15-20 minutes  per hour while awake for the first 2 days.  Only take over-the-counter or prescription medicines for pain, discomfort, or fever as directed by your caregiver. SEEK IMMEDIATE MEDICAL CARE IF:   Your pain increases, or you are very uncomfortable.  You have a fever.  Your chest pain becomes worse.  You have new, unexplained symptoms.  You have nausea or vomiting.  You feel sweaty or lightheaded.  You have a cough with phlegm (sputum), or you cough up blood. MAKE SURE YOU:   Understand these instructions.  Will watch your condition.  Will get help right away if you are not doing well or get worse. Document Released: 11/20/2005 Document Revised: 02/12/2012 Document Reviewed: 07/17/2011 [facility] Patient Information 2015 [place], [place]. This information is not intended to replace advice given to you by your health care provider. Make sure you discuss any questions you have with your health care provider.  AIDS AIDS (Acquired Immune Deficiency Syndrome) is a severe viral infection caused by the Human Immunodeficiency Virus (HIV). This virus destroys a person's resistance to disease and certain cancers. It is transmitted through blood, blood products, and body fluids. Although the fear of AIDS has grown faster than the epidemic, it is important to note that AIDS is not spread by casual contact and is easily killed by hot water, soap, bleach, and most antiseptics. HOW THE AIDS INFECTION WORKS Once HIV enters the body it affects T-helper (T-4) lymphocytes. These are white blood cells that are crucial for the immune system. Once they become infected, they become factories for producing the AIDS Virus. Eventually these infected T-4 cells die. This leaves the victim susceptible to infection and certain cancers. While anyone can get AIDS, you are unlikely to get this disease unless you indulge in high-risk behavior. Some of these high-risk behaviors are promiscuous sex, having close relationships  with HIV-positive people, and the sharing of needles. This illness was initially more common in homosexual men, but as time progresses, it will most probably affect an equal number of men and women. Babies born to women who are infected, have greater chances of getting AIDS. Infection with HIV may cause a brief, mild illness with fever several weeks after contact. More serious symptoms do not develop until months or years later, so a person can be infected with the virus without showing any symptoms at all. At this stage of the infection there is no way of knowing you are infected unless you have a blood or mouth scraping test for the AIDS virus. SYMPTOMS   Fevers, night sweats, general weakness, enlarged lymph nodes.  Chest pain, pneumonia, chronic cough, shortness of breath.  Weight loss, diarrhea, difficulty swallowing, rectal problems.  Headaches, personality changes, problems with vision  and memory.  Skin tumors (patchy dark areas) and infections. There is no cure or vaccine for AIDS at the present time. Anti-viral antibiotic drugs have been shown to stop the virus from multiplying, which helps prolong health. The best treatment against AIDS is prevention. If you have been infected with HIV, careful medical follow-up with regular blood tests is necessary.  Do not take part in risky behaviors. These include sharing needles and syringes or other sharp instruments like razors with others, having unprotected sex with high-risk people (homosexuals, bisexuals, or prostitutes), and engaging in anal sex (with or without a condom).  For further information about AIDS, please call your caregiver, the health department, or the Center for Disease Control: 800-342-AIDS. MISCONCEPTIONS ABOUT AIDS  You can not get AIDS through casual contact. This includes sitting next to an AIDS infected person, being coughed on, living with, swimming with, eating food prepared by, sitting or lying next to someone with  AIDS.  It is not caught from toilet seats, from showers, bath tubs, water fountains, phones, drinking glasses, or food touched or used by people with AIDS. Casual kissing will probably not transmit the disease. "[place] kissing" (putting one's tongue in another's mouth) is probably not a good idea as the AIDS Virus is present in saliva.  You will not get AIDS by donating blood. The needles used by blood banks are sterile and disposable.  There is no evidence that AIDS is transmitted through tears.  AIDS is not caught through mosquitoes.  Children with AIDS will not pass AIDS to other children in school without exchange of blood products or engaging in sex. In school, a child with AIDS is actually at greater risk because of their weak immune status. Their susceptibility to the viruses and germs (bacteria) carried by children without AIDS is great. TESTING FOR AIDS  An ELISA (enzyme-linked immunoabsorbent assay) is a blood test available to let you know if you have contracted AIDS. If this test is positive, it is usually repeated.  If positive a second time, a second test known as the Western blot test is performed. If the Western blot test is positive, it means you have been infected with HIV. PREVENTION  The best way to prevent AIDS is to avoid high-risk behavior.  The outlook for defeating AIDS is good. Millions of research dollars are being spent on creating a vaccine to prevent the disease as well as providing a cure. New drugs appear to be extremely effective at controlling the disease.  Your caregiver will educate you in all the most effective and current treatments. Document Released: 11/17/2000 Document Revised: 02/12/2012 Document Reviewed: 11/13/2008 [facility] Patient Information 2015 [place], [place]. This information is not intended to replace advice given to you by your health care provider. Make sure you discuss any questions you have with your health care provider.   Emergency  Department Resource Guide 1) Find a Doctor and Pay Out of Pocket Although you won't have to find out who is covered by your insurance plan, it is a good idea to ask around and get recommendations. You will then need to call the office and see if the doctor you have chosen will accept you as a new patient and what types of options they offer for patients who are self-pay. Some doctors offer discounts or will set up payment plans for their patients who do not have insurance, but you will need to ask so you aren't surprised when you get to your appointment.  2) Contact Your  Local Health Department Not all health departments have doctors that can see patients for sick visits, but many do, so it is worth a call to see if yours does. If you don't know where your local health department is, you can check in your phone book. The CDC also has a tool to help you locate your state's health department, and many state websites also have listings of all of their local health departments.  3) Find a Walk-in Clinic If your illness is not likely to be very severe or complicated, you may want to try a walk in clinic. These are popping up all over the country in pharmacies, drugstores, and shopping centers. They're usually staffed by nurse practitioners or physician assistants that have been trained to treat common illnesses and complaints. They're usually fairly quick and inexpensive. However, if you have serious medical issues or chronic medical problems, these are probably not your best option.  No Primary Care Doctor: - Call Health Connect at  [phone] - they can help you locate a primary care doctor that  accepts your insurance, provides certain services, etc. - Physician Referral Service- [phone]  Chronic Pain Problems: Organization         Address  Phone   Notes  [facility]  [phone] Patients need to be referred by their primary care doctor.   Medication  Assistance: Organization         Address  Phone   Notes  [facility] Medication [facility] [address] [place] [place], [place] [address] [phone] --Must be a resident of [facility] -- Must have NO insurance coverage whatsoever (no Medicaid/ Medicare, etc.) -- The pt. MUST have a primary care doctor that directs their care regularly and follows them in the community   [facility]  [phone]   [employer]  [phone]    Agencies that provide inexpensive medical care: Organization         Address  Phone   Notes  [facility]  [phone]   [facility]    [phone]   [facility] [address] [place], [place] [address] [phone]   [facility] [address], [place] [phone]   Planned Parenthood    [phone]   [facility]    [phone]   [facility]  [address], [place] Phone:  [phone], Fax:  [phone] Hours of Operation:  9 am - 6 pm, M-F.  Also accepts Medicaid/Medicare and self-pay.  [facility]  [address], [place] Phone: [phone], Fax: [phone]. Hours of Operation:  8:30 am - 5:30 pm, M-F.  Also accepts Medicaid and self-pay.  [facility] [place] [address] [place] Phone: [phone]   [facility] [address] Natasha Bence [place], [place] [phone], Ext. [phone] Mondays & Thursdays: 7-9 AM.  First 15 patients are seen on a first come, first serve basis.    Medicaid-accepting [facility] Providers:  Organization         Address  Phone   Notes  [facility] [address], [place] [phone] Also accepts self-pay patients.  [facility] [address] Laurell Josephs [place], [place]  [phone]   [facility] [address], [address]  [phone])  [phone]   [facility] [address], [place] [phone]   Renaye Rakers [address], [place]   [phone] Only accepts [place] patients after they have their name applied to their card.   Self-Pay (no insurance) in [facility]:  Organization         Address  Phone   Notes  Sickle Cell Patients, [facility] [address] [place], [place] [phone]   [facility] [address] [place], [place] [phone]   [facility] [place]  [address], [place] [phone]   [facility]/Dr. Osei-Bonsu  [address] [place] or [address], [place] [phone] Phone number for both [place] and [place] locations is the same.  [facility] [address] [place] [phone]   [facility] [address] [place] or [address] [phone]   [facility] [address] [place] [phone], phone; [phone], fax Sees patients 1st and 3rd Saturday of every month.  Must not qualify for public or private insurance (i.e. Medicaid, Medicare, [place] Health Choice, Veterans' Benefits)  Household income should be no more than 200% of the poverty level The clinic cannot treat you if you are pregnant or think you are pregnant  Sexually transmitted diseases are not treated at the clinic.    Dental Care: Organization         Address  Phone  Notes  [facility] of [facility] [address] [place], [place] [phone] Accepts children up to age 39 who are enrolled in [place] or [place] Health Choice; pregnant women with a Medicaid card; and children who have applied for Medicaid or [place] Health Choice, but were declined, whose parents can pay a reduced fee at time of service.  [facility]  Department of [facility]  [address], [place] [phone] Accepts children up to age 52 who are enrolled in [place] or [place] Health Choice; pregnant women with a Medicaid card; and children who have applied for Medicaid or [place] Health Choice, but were declined, whose parents can pay a reduced fee at time of service.  [facility]  [address] [place], [place] [phone] Patients are seen by appointment only. Walk-ins are not accepted. [facility] will see patients 51 years of age and older. Monday - Tuesday (8am-5pm) Most Wednesdays (8:30-5pm) $30 per visit, cash only  [facility]  [address], [facility] [phone] Patients are seen by appointment only. Walk-ins are not accepted. [facility] will see patients 67 years of age and older. One Wednesday Evening (Monthly: Volunteer Based).  $30 per visit, cash only  [employer]  [phone] for adults; Children under age 71, call Graduate Pediatric Dentistry at [phone]. Children aged 79-14, please call [phone] to request a pediatric application.  Dental services are provided in all areas of dental care including fillings, crowns and bridges, complete and partial dentures, implants, gum treatment, root canals, and extractions. Preventive care is also provided. Treatment is provided to both adults and children. Patients are selected via a lottery and there is often a waiting list.   [facility] [address], [place]  [phone]  [email or web address]   [facility] [address] [place], [place] [phone], Ext. [phone] Second and Fourth Thursday of each month, opens at 6:30 AM; Clinic ends at 9 AM.  Patients are seen on a first-come first-served basis, and a limited number are seen during each clinic.   [facility]  [address] Ether Griffins [place], [place] [phone]    Eligibility Requirements You must have lived in [place], [place], or [place] counties for at least the last three months.   You cannot be eligible for state or federal sponsored [place], including [employer], [place], or [employer].   You generally cannot be eligible for healthcare insurance through your employer.    How to apply: Eligibility screenings are held every Tuesday and Wednesday afternoon from 1:00 pm until 4:00 pm. You do not need an appointment for the interview!  [facility] [address] [place], [place] [phone]   [facility] Health Department  [phone]   [facility] Health Department  [phone]   [facility] Health Department  [phone]    Behavioral Health Resources in the Community: Intensive Outpatient Programs Organization         Address  Phone  Notes  [facility] Services [address], [place], [place] [phone]   [facility] [address], [place], [place] [phone]   [facility] [address] [place], [place]  [phone]   [facility] Health [address]  [place], [place] [phone] or [phone]   Substance Abuse Resources Organization         Address  Phone  Notes  [facility]  [phone]   [facility]  [phone]   [facility]  [phone]   Floydene Flock  [phone]   Residential & Outpatient Substance Abuse Program  [phone]   Psychological Services Organization         Address  Phone  Notes  [facility] Behavioral Health  [phone]   [facility] Services  [phone]   [facility] Mental Health [address], [place] [phone] or [phone]    Mobile Crisis Teams Organization         Address  Phone  Notes  [facility], Mobile Crisis Care Unit  [phone]   [facility]  [address]  [place], [place] [phone]   Doristine Locks [address] [place] [phone]    Self-Help/Support Groups Organization         Address  Phone             Notes  [facility] [place] - variety of support groups  [phone]- [phone] Call for more information  Narcotics Anonymous (NA), [facility] [address], [employer] [place]  2 meetings at this location   Statistician         Address  Phone  Notes  [facility] [address],    [place]  [phone]   [facility]  [address] [place] [address], [place], [place] [phone]   [facility] [address] [place], [place] [phone] Admissions: 8am-3pm M-F  [facility] [address]    [place], [place] [phone]   [facility] [address] [place], [place] [phone]   [facility] [address],  [place], [place] [phone]   [facility] - Intensive  Outpatient [address], [place], [place] [phone]   [facility] [address]  [place], [place] [phone] or [phone]   [facility] [address] [place], [place] [phone] Accepts Medicaid  [facility] [address]  [place] [phone] Substance Abuse/Addiction Treatment   [facility]         Address  Phone  Notes  [facility]  [phone]) [phone]   Angie FavaJulie Brannon, PhD [address], [address] [place], [place]   [phone]) [phone] or [phone](336) [phone]   [facility]   [address] [place], [place] [phone]) [phone]   [facility] [address], [place], [place] [phone]) [phone] Insurance/Medicaid/sponsorship through [facility]  [facility] [address]                                    [place], [place] [phone](336) [phone] Therapy/tele-psych/case    [facility] [address]   [place], [place] [phone]) [phone]    Dr. Lolly MustacheArfeen  [phone]) [phone]   [facility]  [facility] 1) [address], [place] 2) [address], [place] 3)  [address], [place] [phone]) [phone]  [phone]) [phone]   [facility] Child [facility] Hotline [phone]) [phone] or [phone]) [phone] (After Hours)

## 2015-06-15 ENCOUNTER — Emergency Department (HOSPITAL_COMMUNITY): Payer: Self-pay

## 2015-06-15 ENCOUNTER — Emergency Department (HOSPITAL_COMMUNITY)
Admission: EM | Admit: 2015-06-15 | Discharge: 2015-06-16 | Disposition: A | Discharge status: Home / Self Care | Payer: Self-pay | Attending: Emergency Medicine | Admitting: Emergency Medicine

## 2015-06-15 ENCOUNTER — Encounter (HOSPITAL_COMMUNITY): Payer: Self-pay | Admitting: Emergency Medicine

## 2015-06-15 DIAGNOSIS — B20 Human immunodeficiency virus [HIV] disease: Secondary | ICD-10-CM | POA: Insufficient documentation

## 2015-06-15 DIAGNOSIS — Z720 Tobacco use: Secondary | ICD-10-CM | POA: Insufficient documentation

## 2015-06-15 DIAGNOSIS — R079 Chest pain, unspecified: Secondary | ICD-10-CM

## 2015-06-15 DIAGNOSIS — J45909 Unspecified asthma, uncomplicated: Secondary | ICD-10-CM | POA: Insufficient documentation

## 2015-06-15 DIAGNOSIS — Z792 Long term (current) use of antibiotics: Secondary | ICD-10-CM | POA: Insufficient documentation

## 2015-06-15 DIAGNOSIS — Z8679 Personal history of other diseases of the circulatory system: Secondary | ICD-10-CM | POA: Insufficient documentation

## 2015-06-15 DIAGNOSIS — Z79899 Other long term (current) drug therapy: Secondary | ICD-10-CM | POA: Insufficient documentation

## 2015-06-15 LAB — CBC WITH DIFFERENTIAL/PLATELET
BASOS PCT: 1 % (ref 0–1)
Basophils Absolute: 0 10*3/uL (ref 0.0–0.1)
Eosinophils Absolute: 0.1 10*3/uL (ref 0.0–0.7)
Eosinophils Relative: 5 % (ref 0–5)
HCT: 34.5 % — ABNORMAL LOW (ref 39.0–52.0)
HEMOGLOBIN: 12.2 g/dL — AB (ref 13.0–17.0)
Lymphocytes Relative: 29 % (ref 12–46)
Lymphs Abs: 0.6 10*3/uL — ABNORMAL LOW (ref 0.7–4.0)
MCH: 33.9 pg (ref 26.0–34.0)
MCHC: 35.4 g/dL (ref 30.0–36.0)
MCV: 95.8 fL (ref 78.0–100.0)
Monocytes Absolute: 0.3 10*3/uL (ref 0.1–1.0)
Monocytes Relative: 15 % — ABNORMAL HIGH (ref 3–12)
NEUTROS ABS: 1.1 10*3/uL — AB (ref 1.7–7.7)
Neutrophils Relative %: 50 % (ref 43–77)
PLATELETS: 69 10*3/uL — AB (ref 150–400)
RBC: 3.6 MIL/uL — ABNORMAL LOW (ref 4.22–5.81)
RDW: 12.4 % (ref 11.5–15.5)
WBC: 2.1 10*3/uL — ABNORMAL LOW (ref 4.0–10.5)

## 2015-06-15 LAB — I-STAT TROPONIN, ED: TROPONIN I, POC: 0 ng/mL (ref 0.00–0.08)

## 2015-06-15 LAB — PATHOLOGIST SMEAR REVIEW

## 2015-06-15 NOTE — ED Notes (Addendum)
Pt admits to taking oxycodone (prescribed) for pain today and also having a 40oz beer this evening.  Pt with a hx of AIDS and Karposi's Sarcoma.

## 2015-06-15 NOTE — ED Notes (Signed)
Per EMS:  Pt seen yesterday for the same.  EMS sts EKG "unremarkable".  No meds given en route.  CP started approx 10pm and intensified over the last 45 minutes, located in central chest, denies any SOB, nausea, dizziness, etc.

## 2015-06-15 NOTE — ED Provider Notes (Signed)
CSN: [id number]     Arrival date & time 06/15/15  2244 History   First MD Initiated Contact with Patient 06/15/15 2245     Chief Complaint  Patient presents with  . Chest Pain     (Consider location/radiation/quality/duration/timing/severity/associated sxs/prior Treatment) HPI Comments: Patient is a 49 year old male with a past medical history of HIV, AIDS, Hepatitis C, and asthma who presents with chest pain that started around 10pm tonight. Most recent CD4 count 200 in April 2016. Patient was seen yesterday for chest pain but "this is different." Prior to arrival, patient reports sudden onset of chest tightness in his central chest without radiation. The pain lasted a few minutes and resolved. Patient reports this pain has been intermittent since the onset. No diaphoresis, SOB, nausea, vomiting. No aggravating/alleviating factors. No other associated symptoms.     Past Medical History  Diagnosis Date  . HIV (human immunodeficiency virus infection)   . Hepatitis C   . Asthma   . AIDS   . Cerebral hemorrhage    Past Surgical History  Procedure Laterality Date  . Hernia repair    . Mandible surgery    . Laporotomy     Family History  Problem Relation Age of Onset  . CAD Mother   . Stroke Mother    History  Substance Use Topics  . Smoking status: Current Every Day Smoker -- 2.00 packs/day for 38 years    Types: Cigarettes  . Smokeless tobacco: Never Used  . Alcohol Use: Yes     Comment: 4 beers tonight    Review of Systems  Constitutional: Negative for fever, chills and fatigue.  HENT: Negative for trouble swallowing.   Eyes: Negative for visual disturbance.  Respiratory: Negative for shortness of breath.   Cardiovascular: Positive for chest pain. Negative for palpitations.  Gastrointestinal: Negative for nausea, vomiting, abdominal pain and diarrhea.  Genitourinary: Negative for dysuria and difficulty urinating.  Musculoskeletal: Negative for arthralgias and neck pain.   Skin: Negative for color change.  Neurological: Negative for dizziness and weakness.  Psychiatric/Behavioral: Negative for dysphoric mood.      Allergies  Aspirin  Home Medications   Prior to Admission medications   Medication Sig Start Date End Date Taking? Authorizing Provider  albuterol (PROVENTIL HFA;VENTOLIN HFA) 108 (90 BASE) MCG/ACT inhaler Inhale 2 puffs into the lungs every 6 (six) hours as needed for wheezing or shortness of breath (cough). 11/17/13   Earley Favor, NP  benzonatate (TESSALON) 200 MG capsule Take 1 capsule (200 mg total) by mouth 3 (three) times daily as needed for cough. Patient not taking: Reported on 06/14/2015 03/23/15   Elpidio Anis, PA-C  chlordiazePOXIDE (LIBRIUM) 10 MG capsule Take 1 capsule (10 mg total) by mouth 3 (three) times daily. Patient not taking: Reported on 06/14/2015 03/23/15   Elpidio Anis, PA-C  clonazePAM (KLONOPIN) 1 MG tablet Take 1 tablet (1 mg total) by mouth 2 (two) times daily as needed for anxiety. 05/11/15   Ozella Rocks, MD  Darunavir Ethanolate (PREZISTA) 800 MG tablet Take 1 tablet (800 mg total) by mouth daily with breakfast. 11/17/13   Earley Favor, NP  dronabinol (MARINOL) 2.5 MG capsule Take 1 capsule (2.5 mg total) by mouth 2 (two) times daily. 05/03/15   Trixie Dredge, PA-C  emtricitabine-tenofovir (TRUVADA) 200-300 MG per tablet Take 1 tablet by mouth every morning. 11/17/13   Earley Favor, NP  fexofenadine (ALLEGRA) 180 MG tablet Take 180 mg by mouth daily.    Historical Provider,  MD  HYDROcodone-acetaminophen (NORCO/VICODIN) 5-325 MG per tablet Take 1 tablet by mouth every 6 (six) hours as needed. Patient not taking: Reported on 06/14/2015 05/11/15   Ozella Rocksavid J Merrell, MD  hydroxypropyl methylcellulose (ISOPTO TEARS) 2.5 % ophthalmic solution Place 1 drop into both eyes 3 (three) times daily as needed for dry eyes.    Historical Provider, MD  Multiple Vitamin (MULTIVITAMIN WITH MINERALS) TABS tablet Take 1 tablet by mouth daily.     Historical Provider, MD  oxyCODONE (ROXICODONE) 5 MG immediate release tablet Take 1 tablet (5 mg total) by mouth every 6 (six) hours as needed for severe pain. 06/14/15   Ladona MowJoe Mintz, PA-C  promethazine (PHENERGAN) 25 MG tablet Take 25 mg by mouth every 6 (six) hours as needed for nausea or vomiting.    Historical Provider, MD  ritonavir (NORVIR) 100 MG capsule Take 1 capsule (100 mg total) by mouth every morning. 11/17/13   Earley FavorGail Schulz, NP  sertraline (ZOLOFT) 50 MG tablet Take 50 mg by mouth daily.    Historical Provider, MD  sulfamethoxazole-trimethoprim (BACTRIM DS) 800-160 MG per tablet Take 1 tablet by mouth daily. 11/17/13   Earley FavorGail Schulz, NP   BP 149/102 mmHg  Pulse 75  Temp(Src) 98.7 F (37.1 C) (Oral)  Resp 20  SpO2 98% Physical Exam  Constitutional: He is oriented to person, place, and time. He appears well-developed and well-nourished. No distress.  HENT:  Head: Normocephalic and atraumatic.  Eyes: Conjunctivae and EOM are normal.  Neck: Normal range of motion.  Cardiovascular: Normal rate and regular rhythm.  Exam reveals no gallop and no friction rub.   No murmur heard. No lower extremity edema or calf tenderness to palpation.   Pulmonary/Chest: Effort normal and breath sounds normal. He has no wheezes. He has no rales. He exhibits no tenderness.  Abdominal: Soft. He exhibits no distension. There is no tenderness. There is no rebound.  Musculoskeletal: Normal range of motion.  Neurological: He is alert and oriented to person, place, and time. Coordination normal.  Speech is goal-oriented. Moves limbs without ataxia.   Skin: Skin is warm and dry.  Psychiatric: He has a normal mood and affect. His behavior is normal.  Nursing note and vitals reviewed.   ED Course  Procedures (including critical care time) Labs Review Labs Reviewed - No data to display  Imaging Review Dg Chest 2 View  06/14/2015   CLINICAL DATA:  Chest pain and shortness of breath. Bilateral leg pain.   EXAM: CHEST  2 VIEW  COMPARISON:  02/05/2014  FINDINGS: The heart size and mediastinal contours are within normal limits. Both lungs are clear. The visualized skeletal structures are unremarkable.  IMPRESSION: Normal exam.   Electronically Signed   By: Francene BoyersJames  Maxwell M.D.   On: 06/14/2015 13:36   Dg Hip Unilat With Pelvis 2-3 Views Left  06/14/2015   CLINICAL DATA:  LEFT hip pain for 1 week. Bruising on the proximal lateral femur. No known injury.  EXAM: DG HIP (WITH OR WITHOUT PELVIS) 2-3V LEFT  COMPARISON:  None.  FINDINGS: Pelvic rings are intact. Atherosclerotic calcifications are present in the region of the RIGHT femoral artery. The obturator rings appear intact. Lumbar spondylosis is present with asymmetric LEFT-sided L4-L5 disc space collapse. SI joints appear normal. LEFT hip appears normal.  IMPRESSION: Negative.   Electronically Signed   By: Andreas NewportGeoffrey  Lamke M.D.   On: 06/14/2015 15:19     EKG Interpretation   Date/Time:  Tuesday June 15 2015 22:52:00 EDT  Ventricular Rate:  79 PR Interval:  163 QRS Duration: 95 QT Interval:  426 QTC Calculation: 488 R Axis:   59 Text Interpretation:  Sinus rhythm Borderline prolonged QT interval Sinus  rhythm QT prolonged Borderline ECG Confirmed by Gerhard Munch  MD  [phone]) on 06/15/2015 11:32:18 PM      MDM   Final diagnoses:  Chest pain    11:37 PM Labs and chest xray pending. Vitals stable and patient afebrile.   3:08 AM Labs and chest xray unremarkable for acute changes. Delta trop negative. Vitals stable and patient afebrile. Patient advised to follow up with [facility].   Emilia Beck, PA-C 06/16/15 0310  Marisa Severin, MD 06/16/15 [phone]

## 2015-06-15 NOTE — ED Notes (Signed)
PA at bedside.

## 2015-06-16 LAB — BASIC METABOLIC PANEL
Anion gap: 11 (ref 5–15)
BUN: 8 mg/dL (ref 6–20)
CO2: 23 mmol/L (ref 22–32)
Calcium: 8.8 mg/dL — ABNORMAL LOW (ref 8.9–10.3)
Chloride: 99 mmol/L — ABNORMAL LOW (ref 101–111)
Creatinine, Ser: 1.31 mg/dL — ABNORMAL HIGH (ref 0.61–1.24)
GFR calc Af Amer: >60 mL/min (ref >60)
GFR calc non Af Amer: >60 mL/min (ref >60)
Glucose, Bld: 88 mg/dL (ref 65–99)
POTASSIUM: 3.7 mmol/L (ref 3.5–5.1)
Sodium: 133 mmol/L — ABNORMAL LOW (ref 135–145)

## 2015-06-16 LAB — I-STAT TROPONIN, ED: Troponin i, poc: 0.02 ng/mL (ref 0.00–0.08)

## 2015-06-16 NOTE — Discharge Instructions (Signed)
[facility] for further evaluation. Refer to attached documents for more information.

## 2015-06-16 NOTE — ED Notes (Signed)
Pt sts chest pressure has resolved.  Pt sts he thinks he may have been having an anxiety attack earlier when the pressure started.  Will make PA aware.

## 2015-06-22 NOTE — Progress Notes (Signed)
Patient ID: Steven [name], male   DOB: 03/17/1966, 49 y.o.   MRN: [id number]    error

## 2015-07-09 ENCOUNTER — Encounter (HOSPITAL_COMMUNITY): Payer: Self-pay

## 2015-07-09 ENCOUNTER — Emergency Department (HOSPITAL_COMMUNITY)
Admission: EM | Admit: 2015-07-09 | Discharge: 2015-07-09 | Disposition: A | Discharge status: Home / Self Care | Payer: Self-pay | Attending: Emergency Medicine | Admitting: Emergency Medicine

## 2015-07-09 DIAGNOSIS — Z720 Tobacco use: Secondary | ICD-10-CM | POA: Insufficient documentation

## 2015-07-09 DIAGNOSIS — S0101XA Laceration without foreign body of scalp, initial encounter: Secondary | ICD-10-CM | POA: Insufficient documentation

## 2015-07-09 DIAGNOSIS — W2209XA Striking against other stationary object, initial encounter: Secondary | ICD-10-CM | POA: Insufficient documentation

## 2015-07-09 DIAGNOSIS — Z8619 Personal history of other infectious and parasitic diseases: Secondary | ICD-10-CM | POA: Insufficient documentation

## 2015-07-09 DIAGNOSIS — Y9289 Other specified places as the place of occurrence of the external cause: Secondary | ICD-10-CM | POA: Insufficient documentation

## 2015-07-09 DIAGNOSIS — Y990 Civilian activity done for income or pay: Secondary | ICD-10-CM | POA: Insufficient documentation

## 2015-07-09 DIAGNOSIS — Z21 Asymptomatic human immunodeficiency virus [HIV] infection status: Secondary | ICD-10-CM | POA: Insufficient documentation

## 2015-07-09 DIAGNOSIS — J45909 Unspecified asthma, uncomplicated: Secondary | ICD-10-CM | POA: Insufficient documentation

## 2015-07-09 DIAGNOSIS — Z8679 Personal history of other diseases of the circulatory system: Secondary | ICD-10-CM | POA: Insufficient documentation

## 2015-07-09 DIAGNOSIS — Y9389 Activity, other specified: Secondary | ICD-10-CM | POA: Insufficient documentation

## 2015-07-09 DIAGNOSIS — Z79899 Other long term (current) drug therapy: Secondary | ICD-10-CM | POA: Insufficient documentation

## 2015-07-09 MED ORDER — ACETAMINOPHEN 325 MG PO TABS: 650.0000 mg | ORAL_TABLET | Freq: Once | ORAL | Status: DC

## 2015-07-09 NOTE — ED Notes (Signed)
Bed: RU04 Expected date:  Expected time:  Means of arrival:  Comments: EMS- psych/ETOH, fall, head lac

## 2015-07-09 NOTE — Discharge Instructions (Signed)

## 2015-07-09 NOTE — ED Notes (Signed)
Per EMS, pt from job site.  Pt was under a truck and went to raise up and struck top of head.  Pt bleeding controlled but with large amount.  Pt has etoh on board.  No LOC.  Vitals: 150/90, hr 110,   Pt very "excited" on arrival.  Wanting to clean all blood off of him d/t medical history.

## 2015-07-09 NOTE — ED Provider Notes (Signed)
CSN: [id number]     Arrival date & time 07/09/15  1204 History   First MD Initiated Contact with Patient 07/09/15 1212     Chief Complaint  Patient presents with  . Head Laceration      HPI Patient reports he was working under a car this morning when he rates his head quickly which resulted in a laceration to his superior scalp.  It is small amount of bleeding at the scene and was brought to the emergency department by EMS.  He is not on anticoagulants.  No loss consciousness.  He denies significant headache at this time.  Denies neck pain.  No weakness of his arms or legs.  No other complaints.  Pain is mild in severity   Past Medical History  Diagnosis Date  . HIV (human immunodeficiency virus infection)   . Hepatitis C   . Asthma   . AIDS   . Cerebral hemorrhage    Past Surgical History  Procedure Laterality Date  . Hernia repair    . Mandible surgery    . Laporotomy     Family History  Problem Relation Age of Onset  . CAD Mother   . Stroke Mother    History  Substance Use Topics  . Smoking status: Current Every Day Smoker -- 2.00 packs/day for 38 years    Types: Cigarettes  . Smokeless tobacco: Never Used  . Alcohol Use: Yes     Comment: 4 beers tonight    Review of Systems  All other systems reviewed and are negative.     Allergies  Aspirin  Home Medications   Prior to Admission medications   Medication Sig Start Date End Date Taking? Authorizing Provider  albuterol (PROVENTIL HFA;VENTOLIN HFA) 108 (90 BASE) MCG/ACT inhaler Inhale 2 puffs into the lungs every 6 (six) hours as needed for wheezing or shortness of breath (cough). 11/17/13   Earley Favor, NP  benzonatate (TESSALON) 200 MG capsule Take 1 capsule (200 mg total) by mouth 3 (three) times daily as needed for cough. Patient not taking: Reported on 06/14/2015 03/23/15   Elpidio Anis, PA-C  chlordiazePOXIDE (LIBRIUM) 10 MG capsule Take 1 capsule (10 mg total) by mouth 3 (three) times daily. Patient not  taking: Reported on 06/14/2015 03/23/15   Elpidio Anis, PA-C  clonazePAM (KLONOPIN) 1 MG tablet Take 1 tablet (1 mg total) by mouth 2 (two) times daily as needed for anxiety. 05/11/15   Ozella Rocks, MD  Darunavir Ethanolate (PREZISTA) 800 MG tablet Take 1 tablet (800 mg total) by mouth daily with breakfast. 11/17/13   Earley Favor, NP  dronabinol (MARINOL) 2.5 MG capsule Take 1 capsule (2.5 mg total) by mouth 2 (two) times daily. 05/03/15   Trixie Dredge, PA-C  emtricitabine-tenofovir (TRUVADA) 200-300 MG per tablet Take 1 tablet by mouth every morning. 11/17/13   Earley Favor, NP  fexofenadine (ALLEGRA) 180 MG tablet Take 180 mg by mouth daily.    Historical Provider, MD  HYDROcodone-acetaminophen (NORCO/VICODIN) 5-325 MG per tablet Take 1 tablet by mouth every 6 (six) hours as needed. Patient not taking: Reported on 06/14/2015 05/11/15   Ozella Rocks, MD  hydroxypropyl methylcellulose (ISOPTO TEARS) 2.5 % ophthalmic solution Place 1 drop into both eyes 3 (three) times daily as needed for dry eyes.    Historical Provider, MD  Multiple Vitamin (MULTIVITAMIN WITH MINERALS) TABS tablet Take 1 tablet by mouth daily.    Historical Provider, MD  oxyCODONE (ROXICODONE) 5 MG immediate release tablet Take 1  tablet (5 mg total) by mouth every 6 (six) hours as needed for severe pain. 06/14/15   Ladona Mow, PA-C  promethazine (PHENERGAN) 25 MG tablet Take 25 mg by mouth every 6 (six) hours as needed for nausea or vomiting.    Historical Provider, MD  ritonavir (NORVIR) 100 MG capsule Take 1 capsule (100 mg total) by mouth every morning. 11/17/13   Earley Favor, NP  sertraline (ZOLOFT) 50 MG tablet Take 50 mg by mouth daily.    Historical Provider, MD  sulfamethoxazole-trimethoprim (BACTRIM DS) 800-160 MG per tablet Take 1 tablet by mouth daily. 11/17/13   Earley Favor, NP   BP 127/93 mmHg  Pulse 83  Temp(Src) 98.3 F (36.8 C) (Oral)  Resp 20  SpO2 98% Physical Exam  Constitutional: He is oriented to person,  place, and time. He appears well-developed and well-nourished.  HENT:  Head: Normocephalic.  3 cm laceration of his scalp in the midline.  No active bleeding at this time.  Eyes: EOM are normal.  Neck: Normal range of motion.  C-spine nontender.  Pulmonary/Chest: Effort normal.  Abdominal: He exhibits no distension.  Musculoskeletal: Normal range of motion.  Neurological: He is alert and oriented to person, place, and time.  Psychiatric: He has a normal mood and affect.  Nursing note and vitals reviewed.   ED Course  Procedures (including critical care time)  LACERATION REPAIR Performed by: Lyanne Co Consent: Verbal consent obtained. Risks and benefits: risks, benefits and alternatives were discussed Patient identity confirmed: provided demographic data Time out performed prior to procedure Prepped and Draped in normal sterile fashion Wound explored Laceration Location: scalp Laceration Length: 3cm No Foreign Bodies seen or palpated Anesthesia: none Irrigation method: syringe Amount of cleaning: standard Skin closure: Staple  Number of sutures or staples: 4 Technique: Staple  Patient tolerance: Patient tolerated the procedure well with no immediate complications.   Labs Review Labs Reviewed - No data to display  Imaging Review No results found.   EKG Interpretation None      MDM   Final diagnoses:  Scalp laceration, initial encounter   Minor closed head injury. Normal neuro exam. No indication for imaging at this time. Doubt intracranial bleed. Doubt skull fracture. Pt and family given information on head trauma including strict instructions to return for nausea/vomiting, confusion, altered level of consciousness or new weakness. Pt and family understand and are agreeable to the plan laceration repaired.  Infection warnings given.     Azalia Bilis, MD 07/09/15 [phone]

## 2015-07-10 ENCOUNTER — Encounter (HOSPITAL_COMMUNITY): Payer: Self-pay | Admitting: Emergency Medicine

## 2015-07-10 ENCOUNTER — Emergency Department (HOSPITAL_COMMUNITY)
Admission: EM | Admit: 2015-07-10 | Discharge: 2015-07-10 | Disposition: A | Discharge status: Home / Self Care | Payer: Self-pay | Attending: Emergency Medicine | Admitting: Emergency Medicine

## 2015-07-10 DIAGNOSIS — Z792 Long term (current) use of antibiotics: Secondary | ICD-10-CM | POA: Insufficient documentation

## 2015-07-10 DIAGNOSIS — B20 Human immunodeficiency virus [HIV] disease: Secondary | ICD-10-CM | POA: Insufficient documentation

## 2015-07-10 DIAGNOSIS — J45909 Unspecified asthma, uncomplicated: Secondary | ICD-10-CM | POA: Insufficient documentation

## 2015-07-10 DIAGNOSIS — Z720 Tobacco use: Secondary | ICD-10-CM | POA: Insufficient documentation

## 2015-07-10 DIAGNOSIS — Z760 Encounter for issue of repeat prescription: Secondary | ICD-10-CM | POA: Insufficient documentation

## 2015-07-10 DIAGNOSIS — Z8619 Personal history of other infectious and parasitic diseases: Secondary | ICD-10-CM | POA: Insufficient documentation

## 2015-07-10 DIAGNOSIS — Z79899 Other long term (current) drug therapy: Secondary | ICD-10-CM | POA: Insufficient documentation

## 2015-07-10 MED ORDER — CLONAZEPAM 0.5 MG PO TABS
1.0000 mg | ORAL_TABLET | Freq: Once | ORAL | Status: AC
  Administered 2015-07-10: 1 mg via ORAL
  Filled 2015-07-10: qty 2

## 2015-07-10 NOTE — ED Provider Notes (Signed)
CSN: [id number]     Arrival date & time 07/10/15  0956 History   First MD Initiated Contact with Patient 07/10/15 1001     Chief Complaint  Patient presents with  . Medication Refill     (Consider location/radiation/quality/duration/timing/severity/associated sxs/prior Treatment) HPI Comments: Pt comes in requesting medication refills for klonopin, oxyir and marinol. He states that he moved here and missed his appointment with Infectious disease so he doesn't have an appointment till august 29. He denies si/hi. He states that he has the rest of his medication.   The history is provided by the patient. No language interpreter was used.    Past Medical History  Diagnosis Date  . HIV (human immunodeficiency virus infection)   . Hepatitis C   . Asthma   . AIDS   . Cerebral hemorrhage    Past Surgical History  Procedure Laterality Date  . Hernia repair    . Mandible surgery    . Laporotomy     Family History  Problem Relation Age of Onset  . CAD Mother   . Stroke Mother    History  Substance Use Topics  . Smoking status: Current Every Day Smoker -- 2.00 packs/day for 38 years    Types: Cigarettes  . Smokeless tobacco: Never Used  . Alcohol Use: Yes     Comment: 4 beers tonight    Review of Systems  All other systems reviewed and are negative.     Allergies  Aspirin  Home Medications   Prior to Admission medications   Medication Sig Start Date End Date Taking? Authorizing Provider  albuterol (PROVENTIL HFA;VENTOLIN HFA) 108 (90 BASE) MCG/ACT inhaler Inhale 2 puffs into the lungs every 6 (six) hours as needed for wheezing or shortness of breath (cough). 11/17/13   Earley Favor, NP  benzonatate (TESSALON) 200 MG capsule Take 1 capsule (200 mg total) by mouth 3 (three) times daily as needed for cough. Patient not taking: Reported on 06/14/2015 03/23/15   Elpidio Anis, PA-C  chlordiazePOXIDE (LIBRIUM) 10 MG capsule Take 1 capsule (10 mg total) by mouth 3 (three) times  daily. Patient not taking: Reported on 06/14/2015 03/23/15   Elpidio Anis, PA-C  clonazePAM (KLONOPIN) 1 MG tablet Take 1 tablet (1 mg total) by mouth 2 (two) times daily as needed for anxiety. 05/11/15   Ozella Rocks, MD  Darunavir Ethanolate (PREZISTA) 800 MG tablet Take 1 tablet (800 mg total) by mouth daily with breakfast. 11/17/13   Earley Favor, NP  dronabinol (MARINOL) 2.5 MG capsule Take 1 capsule (2.5 mg total) by mouth 2 (two) times daily. 05/03/15   Trixie Dredge, PA-C  emtricitabine-tenofovir (TRUVADA) 200-300 MG per tablet Take 1 tablet by mouth every morning. 11/17/13   Earley Favor, NP  fexofenadine (ALLEGRA) 180 MG tablet Take 180 mg by mouth daily.    Historical Provider, MD  HYDROcodone-acetaminophen (NORCO/VICODIN) 5-325 MG per tablet Take 1 tablet by mouth every 6 (six) hours as needed. Patient not taking: Reported on 06/14/2015 05/11/15   Ozella Rocks, MD  hydroxypropyl methylcellulose (ISOPTO TEARS) 2.5 % ophthalmic solution Place 1 drop into both eyes 3 (three) times daily as needed for dry eyes.    Historical Provider, MD  Multiple Vitamin (MULTIVITAMIN WITH MINERALS) TABS tablet Take 1 tablet by mouth daily.    Historical Provider, MD  oxyCODONE (ROXICODONE) 5 MG immediate release tablet Take 1 tablet (5 mg total) by mouth every 6 (six) hours as needed for severe pain. 06/14/15   Joe  Rexanne Mano, PA-C  promethazine (PHENERGAN) 25 MG tablet Take 25 mg by mouth every 6 (six) hours as needed for nausea or vomiting.    Historical Provider, MD  ritonavir (NORVIR) 100 MG capsule Take 1 capsule (100 mg total) by mouth every morning. 11/17/13   Earley Favor, NP  sertraline (ZOLOFT) 50 MG tablet Take 50 mg by mouth daily.    Historical Provider, MD  sulfamethoxazole-trimethoprim (BACTRIM DS) 800-160 MG per tablet Take 1 tablet by mouth daily. 11/17/13   Earley Favor, NP   BP 148/104 mmHg  Pulse 74  Temp(Src) 98.7 F (37.1 C) (Oral)  Resp 20  Ht  (1.778 m)  Wt 165 lb (74.844 kg)  BMI  23.68 kg/m2  SpO2 100% Physical Exam  Constitutional: He is oriented to person, place, and time. He appears well-developed and well-nourished.  Cardiovascular: Normal rate and regular rhythm.   Pulmonary/Chest: Effort normal and breath sounds normal.  Musculoskeletal: Normal range of motion.  Neurological: He is alert and oriented to person, place, and time.  Skin: Skin is warm and dry.  Psychiatric: He has a normal mood and affect.  Nursing note and vitals reviewed.   ED Course  Procedures (including critical care time) Labs Review Labs Reviewed - No data to display  Imaging Review No results found.   EKG Interpretation None      MDM   Final diagnoses:  Medication refill    Pt given a dost of klonipin here. Discussed appropriate use of the er    Teressa Lower, NP 07/10/15 1030  Gilda Crease, MD 07/10/15 1239

## 2015-07-10 NOTE — ED Notes (Signed)
Patient was seen last night for head laceration.  Patient here today asking for refills of his medications of Clonazepam, Marinol and Oxycodone until his appointment with Infectious Disease on August 29.

## 2015-07-10 NOTE — ED Notes (Signed)
Discharge instructions given and reviewed with patient.  Patient verbalized understanding to follow up with PMD as needed.  Patient discharged home in good condition. 

## 2015-07-10 NOTE — Discharge Instructions (Signed)
Medication Refill, Emergency Department °We have refilled your medication today as a courtesy to you. It is best for your medical care, however, to take care of getting refills done through your primary caregiver's office. They have your records and can do a better job of follow-up than we can in the emergency department. °On maintenance medications, we often only prescribe enough medications to get you by until you are able to see your regular caregiver. This is a more expensive way to refill medications. °In the future, please plan for refills so that you will not have to use the emergency department for this. °Thank you for your help. Your help allows us to better take care of the daily emergencies that enter our department. °Document Released: 03/08/2004 Document Revised: 02/12/2012 Document Reviewed: 02/27/2014 °Exit[employer]® Patient Information ©2015 [employer]. This information is not intended to replace advice given to you by your health care provider. Make sure you discuss any questions you have with your health care provider. ° °

## 2015-07-12 ENCOUNTER — Encounter (HOSPITAL_COMMUNITY): Payer: Self-pay | Admitting: Family Medicine

## 2015-07-12 ENCOUNTER — Encounter (HOSPITAL_COMMUNITY): Payer: Self-pay

## 2015-07-12 ENCOUNTER — Emergency Department (HOSPITAL_COMMUNITY)
Admission: EM | Admit: 2015-07-12 | Discharge: 2015-07-12 | Disposition: A | Discharge status: Home / Self Care | Payer: Self-pay | Attending: Emergency Medicine | Admitting: Emergency Medicine

## 2015-07-12 DIAGNOSIS — Z8679 Personal history of other diseases of the circulatory system: Secondary | ICD-10-CM | POA: Insufficient documentation

## 2015-07-12 DIAGNOSIS — Z720 Tobacco use: Secondary | ICD-10-CM | POA: Insufficient documentation

## 2015-07-12 DIAGNOSIS — Z760 Encounter for issue of repeat prescription: Secondary | ICD-10-CM | POA: Insufficient documentation

## 2015-07-12 DIAGNOSIS — F10129 Alcohol abuse with intoxication, unspecified: Secondary | ICD-10-CM | POA: Insufficient documentation

## 2015-07-12 DIAGNOSIS — J45909 Unspecified asthma, uncomplicated: Secondary | ICD-10-CM | POA: Insufficient documentation

## 2015-07-12 DIAGNOSIS — F419 Anxiety disorder, unspecified: Secondary | ICD-10-CM | POA: Insufficient documentation

## 2015-07-12 DIAGNOSIS — G8929 Other chronic pain: Secondary | ICD-10-CM | POA: Insufficient documentation

## 2015-07-12 DIAGNOSIS — B20 Human immunodeficiency virus [HIV] disease: Secondary | ICD-10-CM | POA: Insufficient documentation

## 2015-07-12 DIAGNOSIS — Z79899 Other long term (current) drug therapy: Secondary | ICD-10-CM | POA: Insufficient documentation

## 2015-07-12 MED ORDER — DRONABINOL 2.5 MG PO CAPS
2.5000 mg | ORAL_CAPSULE | Freq: Once | ORAL | Status: AC
  Administered 2015-07-12: 2.5 mg via ORAL

## 2015-07-12 MED ORDER — CLONAZEPAM 0.5 MG PO TABS
0.2500 mg | ORAL_TABLET | Freq: Once | ORAL | Status: AC
  Administered 2015-07-12: 0.25 mg via ORAL
  Filled 2015-07-12: qty 1

## 2015-07-12 NOTE — Discharge Instructions (Signed)
Medication Refill, Emergency Department °We have refilled your medication today as a courtesy to you. It is best for your medical care, however, to take care of getting refills done through your primary caregiver's office. They have your records and can do a better job of follow-up than we can in the emergency department. °On maintenance medications, we often only prescribe enough medications to get you by until you are able to see your regular caregiver. This is a more expensive way to refill medications. °In the future, please plan for refills so that you will not have to use the emergency department for this. °Thank you for your help. Your help allows us to better take care of the daily emergencies that enter our department. °Document Released: 03/08/2004 Document Revised: 02/12/2012 Document Reviewed: 02/27/2014 °Exit[employer]® Patient Information ©2015 [employer]. This information is not intended to replace advice given to you by your health care provider. Make sure you discuss any questions you have with your health care provider. ° °

## 2015-07-12 NOTE — ED Notes (Signed)
Pt stable, ambulatory, states understanding of discharge instructions 

## 2015-07-12 NOTE — ED Provider Notes (Signed)
TIME SEEN: 12:58 AM  CHIEF COMPLAINT: Medication refill  HPI:  HPI Comments: Steven [name] is a 49 y.o. male, with a PMhx of HIV, Hepatitis C, who presents to the Emergency Department complaining of a medication refill. He is requesting Marinol, Klonopin, and oxycodone, which he states he has been out of for the past several days. Pt was picked up by authorities from an apartment complex parking lot this evening significantly intoxicated. He was evaluated in the ED 2 days ago for the same complaint, requesting the same medication. Pt is not followed by a PCP, stating he recently left care at [facility]. States that his 2 was prescribing his medications previously. Denies SI or HI.    ROS: See HPI Constitutional: no fever  Eyes: no drainage  ENT: no runny nose   Cardiovascular:  no chest pain  Resp: no SOB  GI: no vomiting GU: no dysuria Integumentary: no rash  Allergy: no hives  Musculoskeletal: no leg swelling  Neurological: no slurred speech ROS otherwise negative  PAST MEDICAL HISTORY/PAST SURGICAL HISTORY:  Past Medical History  Diagnosis Date  . HIV (human immunodeficiency virus infection)   . Hepatitis C   . Asthma   . AIDS   . Cerebral hemorrhage     MEDICATIONS:  Prior to Admission medications   Medication Sig Start Date End Date Taking? Authorizing Provider  albuterol (PROVENTIL HFA;VENTOLIN HFA) 108 (90 BASE) MCG/ACT inhaler Inhale 2 puffs into the lungs every 6 (six) hours as needed for wheezing or shortness of breath (cough). 11/17/13   Earley Favor, NP  benzonatate (TESSALON) 200 MG capsule Take 1 capsule (200 mg total) by mouth 3 (three) times daily as needed for cough. Patient not taking: Reported on 06/14/2015 03/23/15   Elpidio Anis, PA-C  chlordiazePOXIDE (LIBRIUM) 10 MG capsule Take 1 capsule (10 mg total) by mouth 3 (three) times daily. Patient not taking: Reported on 06/14/2015 03/23/15   Elpidio Anis, PA-C  clonazePAM (KLONOPIN) 1 MG tablet Take 1 tablet (1  mg total) by mouth 2 (two) times daily as needed for anxiety. 05/11/15   Ozella Rocks, MD  Darunavir Ethanolate (PREZISTA) 800 MG tablet Take 1 tablet (800 mg total) by mouth daily with breakfast. 11/17/13   Earley Favor, NP  dronabinol (MARINOL) 2.5 MG capsule Take 1 capsule (2.5 mg total) by mouth 2 (two) times daily. 05/03/15   Trixie Dredge, PA-C  emtricitabine-tenofovir (TRUVADA) 200-300 MG per tablet Take 1 tablet by mouth every morning. 11/17/13   Earley Favor, NP  fexofenadine (ALLEGRA) 180 MG tablet Take 180 mg by mouth daily.    Historical Provider, MD  HYDROcodone-acetaminophen (NORCO/VICODIN) 5-325 MG per tablet Take 1 tablet by mouth every 6 (six) hours as needed. Patient not taking: Reported on 06/14/2015 05/11/15   Ozella Rocks, MD  hydroxypropyl methylcellulose (ISOPTO TEARS) 2.5 % ophthalmic solution Place 1 drop into both eyes 3 (three) times daily as needed for dry eyes.    Historical Provider, MD  Multiple Vitamin (MULTIVITAMIN WITH MINERALS) TABS tablet Take 1 tablet by mouth daily.    Historical Provider, MD  oxyCODONE (ROXICODONE) 5 MG immediate release tablet Take 1 tablet (5 mg total) by mouth every 6 (six) hours as needed for severe pain. 06/14/15   Ladona Mow, PA-C  promethazine (PHENERGAN) 25 MG tablet Take 25 mg by mouth every 6 (six) hours as needed for nausea or vomiting.    Historical Provider, MD  ritonavir (NORVIR) 100 MG capsule Take 1 capsule (100 mg total)  by mouth every morning. 11/17/13   Earley Favor, NP  sertraline (ZOLOFT) 50 MG tablet Take 50 mg by mouth daily.    Historical Provider, MD  sulfamethoxazole-trimethoprim (BACTRIM DS) 800-160 MG per tablet Take 1 tablet by mouth daily. 11/17/13   Earley Favor, NP    ALLERGIES:  Allergies  Allergen Reactions  . Aspirin Other (See Comments)    "Free bleeder" affects platelets    SOCIAL HISTORY:  History  Substance Use Topics  . Smoking status: Current Every Day Smoker -- 2.00 packs/day for 38 years    Types:  Cigarettes  . Smokeless tobacco: Never Used  . Alcohol Use: Yes     Comment: ETOH on board.     FAMILY HISTORY: Family History  Problem Relation Age of Onset  . CAD Mother   . Stroke Mother     EXAM: BP 166/101 mmHg  Pulse 104  Temp(Src) 97.8 F (36.6 C) (Oral)  Resp 22  Wt 165 lb (74.844 kg)  SpO2 98% CONSTITUTIONAL: Alert and oriented and responds appropriately to questions. Appears intoxicated, agitated, smells of alcohol, slightly slurred speech HEAD: Normocephalic EYES: Conjunctivae clear, PERRL ENT: normal nose; no rhinorrhea; moist mucous membranes; pharynx without lesions noted NECK: Supple, no meningismus, no LAD  CARD: RRR; S1 and S2 appreciated; no murmurs, no clicks, no rubs, no gallops RESP: Normal chest excursion without splinting or tachypnea; breath sounds clear and equal bilaterally; no wheezes, no rhonchi, no rales, no hypoxia or respiratory distress, speaking full sentences ABD/GI: Normal bowel sounds; non-distended; soft, non-tender, no rebound, no guarding, no peritoneal signs BACK:  The back appears normal and is non-tender to palpation, there is no CVA tenderness EXT: Normal ROM in all joints; non-tender to palpation; no edema; normal capillary refill; no cyanosis, no calf tenderness or swelling    SKIN: Normal color for age and race; warm NEURO: Moves all extremities equally, sensation to light touch intact diffusely, cranial nerves II through XII intact, normal gait, slightly slurred speech but suspect this is secondary to intoxication   MEDICAL DECISION MAKING: Patient here requesting medication refills of his chronic medications. Has been out of these medications for several days. States his appointment with his infectious disease physician at [facility] on August 29. He was seen in the emergency department requesting refills of the same medications 2 days ago and was instructed to follow-up with his doctor who feels these medications for refills.  Discussed with patient that I do not feel it is appropriate to refill his chronic pain and anxiety medications in the emergency department and I do not feel there is any other life-threatening illness present. I also do not feel it is indicated that he receive a dose of his Klonopin in the emergency department which he is requesting as he appears intoxicated. He is been without this medication for several days and I have discussed with patient that the risk of seizure would be very low at this point. Have again advised patient to follow-up with his doctor that feels these medications chronically. I feel he is safe to be discharged. Discussed return precautions.  I personally performed the services described in this documentation, which was scribed in my presence. The recorded information has been reviewed and is accurate.      Layla Maw Cabela Pacifico, DO 07/12/15 [phone]

## 2015-07-12 NOTE — ED Notes (Signed)
Bed: [facility] Expected date:  Expected time:  Means of arrival:  Comments: EMS 49yo pysch

## 2015-07-12 NOTE — ED Notes (Signed)
Patient was picked up from an apartment complex parking lot. Pt has ETOH on board. Pt has been out of Klonopin for the last 48 hours. Denies SI/HI.

## 2015-07-12 NOTE — ED Notes (Signed)
Patient states he has an appointment with infectious disease on the 29th and needs his medication. Pt is upset, cussing, cause he is not getting his Klonopin.

## 2015-07-12 NOTE — ED Notes (Signed)
Pt arrived via EMS from [facility]. Pt left [facility] due to misunderstanding about medication refill.  EMS went over discharge paperwork with patient in route and pt now states he realizes his mistake and understands that the medications were refilled.  Pt would like to leave.

## 2015-07-13 NOTE — ED Provider Notes (Signed)
CSN: [id number]     Arrival date & time 07/12/15  0411 History   First MD Initiated Contact with Patient 07/12/15 [phone]     Chief Complaint  Patient presents with  . Medication Refill     (Consider location/radiation/quality/duration/timing/severity/associated sxs/prior Treatment) Patient is a 49 y.o. male presenting with general illness. The history is provided by the patient.  Illness Location:  Diffuse Quality:  Medication refill request Severity:  Mild Onset quality:  Sudden Timing:  Constant Progression:  Unchanged Chronicity:  Recurrent Context:  Was in the ED and wasn't able to go to his recent PCP appt for medication refills Relieved by:  Nothing Worsened by:  Nothing Associated symptoms: no abdominal pain, no cough, no fever, no shortness of breath and no vomiting     Past Medical History  Diagnosis Date  . HIV (human immunodeficiency virus infection)   . Hepatitis C   . Asthma   . AIDS   . Cerebral hemorrhage    Past Surgical History  Procedure Laterality Date  . Hernia repair    . Mandible surgery    . Laporotomy     Family History  Problem Relation Age of Onset  . CAD Mother   . Stroke Mother    History  Substance Use Topics  . Smoking status: Current Every Day Smoker -- 2.00 packs/day for 38 years    Types: Cigarettes  . Smokeless tobacco: Never Used  . Alcohol Use: Yes     Comment: ETOH on board.     Review of Systems  Constitutional: Negative for fever.  Respiratory: Negative for cough and shortness of breath.   Gastrointestinal: Negative for vomiting and abdominal pain.  All other systems reviewed and are negative.     Allergies  Aspirin  Home Medications   Prior to Admission medications   Medication Sig Start Date End Date Taking? Authorizing Provider  albuterol (PROVENTIL HFA;VENTOLIN HFA) 108 (90 BASE) MCG/ACT inhaler Inhale 2 puffs into the lungs every 6 (six) hours as needed for wheezing or shortness of breath (cough). 11/17/13    Earley Favor, NP  benzonatate (TESSALON) 200 MG capsule Take 1 capsule (200 mg total) by mouth 3 (three) times daily as needed for cough. Patient not taking: Reported on 06/14/2015 03/23/15   Elpidio Anis, PA-C  chlordiazePOXIDE (LIBRIUM) 10 MG capsule Take 1 capsule (10 mg total) by mouth 3 (three) times daily. Patient not taking: Reported on 06/14/2015 03/23/15   Elpidio Anis, PA-C  clonazePAM (KLONOPIN) 1 MG tablet Take 1 tablet (1 mg total) by mouth 2 (two) times daily as needed for anxiety. 05/11/15   Ozella Rocks, MD  Darunavir Ethanolate (PREZISTA) 800 MG tablet Take 1 tablet (800 mg total) by mouth daily with breakfast. 11/17/13   Earley Favor, NP  dronabinol (MARINOL) 2.5 MG capsule Take 1 capsule (2.5 mg total) by mouth 2 (two) times daily. 05/03/15   Trixie Dredge, PA-C  emtricitabine-tenofovir (TRUVADA) 200-300 MG per tablet Take 1 tablet by mouth every morning. 11/17/13   Earley Favor, NP  fexofenadine (ALLEGRA) 180 MG tablet Take 180 mg by mouth daily.    Historical Provider, MD  HYDROcodone-acetaminophen (NORCO/VICODIN) 5-325 MG per tablet Take 1 tablet by mouth every 6 (six) hours as needed. Patient not taking: Reported on 06/14/2015 05/11/15   Ozella Rocks, MD  hydroxypropyl methylcellulose (ISOPTO TEARS) 2.5 % ophthalmic solution Place 1 drop into both eyes 3 (three) times daily as needed for dry eyes.    Historical Provider, MD  Multiple Vitamin (MULTIVITAMIN WITH MINERALS) TABS tablet Take 1 tablet by mouth daily.    Historical Provider, MD  oxyCODONE (ROXICODONE) 5 MG immediate release tablet Take 1 tablet (5 mg total) by mouth every 6 (six) hours as needed for severe pain. 06/14/15   Ladona Mow, PA-C  promethazine (PHENERGAN) 25 MG tablet Take 25 mg by mouth every 6 (six) hours as needed for nausea or vomiting.    Historical Provider, MD  ritonavir (NORVIR) 100 MG capsule Take 1 capsule (100 mg total) by mouth every morning. 11/17/13   Earley Favor, NP  sertraline (ZOLOFT) 50 MG tablet  Take 50 mg by mouth daily.    Historical Provider, MD  sulfamethoxazole-trimethoprim (BACTRIM DS) 800-160 MG per tablet Take 1 tablet by mouth daily. 11/17/13   Earley Favor, NP   BP 143/107 mmHg  Pulse 86  Resp 20  SpO2 97% Physical Exam  Constitutional: He is oriented to person, place, and time. He appears well-developed and well-nourished. No distress.  Intoxicated  HENT:  Head: Normocephalic and atraumatic.  Mouth/Throat: No oropharyngeal exudate.  Eyes: EOM are normal. Pupils are equal, round, and reactive to light.  Neck: Normal range of motion. Neck supple.  Cardiovascular: Normal rate and regular rhythm.  Exam reveals no friction rub.   No murmur heard. Pulmonary/Chest: Effort normal and breath sounds normal. No respiratory distress. He has no wheezes. He has no rales.  Abdominal: He exhibits no distension. There is no tenderness. There is no rebound.  Musculoskeletal: Normal range of motion. He exhibits no edema.  Neurological: He is alert and oriented to person, place, and time.  Skin: He is not diaphoretic.  Nursing note and vitals reviewed.   ED Course  Procedures (including critical care time) Labs Review Labs Reviewed - No data to display  Imaging Review No results found.   EKG Interpretation None      MDM   Final diagnoses:  Medication refill    49 year old male here requesting a refill of his medications. He appears blatantly intoxicated. He was recently seen [facility] and called an ambulance from their lobby to bring him over here for seasonal refills there. He wanted refills of Klonopin and pain medicine. I gave him one Klonopin here and informed him I would not refill his medications. Discharged.    Elwin Mocha, MD 07/13/15 [phone]

## 2015-07-15 ENCOUNTER — Emergency Department (HOSPITAL_COMMUNITY)
Admission: EM | Admit: 2015-07-15 | Discharge: 2015-07-15 | Disposition: A | Discharge status: Home / Self Care | Payer: Self-pay | Attending: Emergency Medicine | Admitting: Emergency Medicine

## 2015-07-15 ENCOUNTER — Encounter (HOSPITAL_COMMUNITY): Payer: Self-pay | Admitting: *Deleted

## 2015-07-15 DIAGNOSIS — R05 Cough: Secondary | ICD-10-CM | POA: Insufficient documentation

## 2015-07-15 DIAGNOSIS — Z21 Asymptomatic human immunodeficiency virus [HIV] infection status: Secondary | ICD-10-CM | POA: Insufficient documentation

## 2015-07-15 DIAGNOSIS — Z4802 Encounter for removal of sutures: Secondary | ICD-10-CM | POA: Insufficient documentation

## 2015-07-15 DIAGNOSIS — Z8679 Personal history of other diseases of the circulatory system: Secondary | ICD-10-CM | POA: Insufficient documentation

## 2015-07-15 DIAGNOSIS — R059 Cough, unspecified: Secondary | ICD-10-CM

## 2015-07-15 DIAGNOSIS — Z720 Tobacco use: Secondary | ICD-10-CM | POA: Insufficient documentation

## 2015-07-15 DIAGNOSIS — Z79899 Other long term (current) drug therapy: Secondary | ICD-10-CM | POA: Insufficient documentation

## 2015-07-15 DIAGNOSIS — Z8619 Personal history of other infectious and parasitic diseases: Secondary | ICD-10-CM | POA: Insufficient documentation

## 2015-07-15 DIAGNOSIS — Z760 Encounter for issue of repeat prescription: Secondary | ICD-10-CM | POA: Insufficient documentation

## 2015-07-15 DIAGNOSIS — J45909 Unspecified asthma, uncomplicated: Secondary | ICD-10-CM | POA: Insufficient documentation

## 2015-07-15 MED ORDER — BENZONATATE 100 MG PO CAPS: 100.0000 mg | ORAL_CAPSULE | Freq: Three times a day (TID) | ORAL | Status: DC

## 2015-07-15 MED ORDER — BENZONATATE 100 MG PO CAPS
100.0000 mg | ORAL_CAPSULE | Freq: Once | ORAL | Status: AC
  Administered 2015-07-15: 100 mg via ORAL
  Filled 2015-07-15: qty 1

## 2015-07-15 NOTE — ED Notes (Signed)
Patient presents stating he has been coughing for the past 7 days.  Patient does not stop talking to get any questions answered.  States he needs Tessalon pearls, out of his Klonopin and oxy

## 2015-07-15 NOTE — ED Provider Notes (Signed)
CSN: [id number]     Arrival date & time 07/15/15  0542 History   First MD Initiated Contact with Patient 07/15/15 0600     Chief Complaint  Patient presents with  . Shortness of Breath     (Consider location/radiation/quality/duration/timing/severity/associated sxs/prior Treatment) HPI Comments: 49 year old male with history of HIV, hepatitis C, and asthma presents to the emergency department requesting a medication refill. Patient has been seen multiple times in the past 5 days for same. He states that he is out of his Marinol, Klonopin, and oxycodone. He is requesting a refill of these prescriptions to get him to his primary care appointment on 08/02/2015. Patient asking "just for a dosing" if we are unable to refill his prescriptions. He is also complaining of a dry cough over the past week. He is asking for a prescription for [facility] for this. Speech is very rapid and pressured. Thought process is slightly tangential, the patient is redirectable. Most recent CD4 count 200 in April 2016.  Patient is a 49 y.o. male presenting with shortness of breath. The history is provided by the patient. No language interpreter was used.  Shortness of Breath Associated symptoms: cough     Past Medical History  Diagnosis Date  . HIV (human immunodeficiency virus infection)   . Hepatitis C   . Asthma   . AIDS   . Cerebral hemorrhage    Past Surgical History  Procedure Laterality Date  . Hernia repair    . Mandible surgery    . Laporotomy     Family History  Problem Relation Age of Onset  . CAD Mother   . Stroke Mother    Social History  Substance Use Topics  . Smoking status: Current Every Day Smoker -- 2.00 packs/day for 38 years    Types: Cigarettes  . Smokeless tobacco: Never Used  . Alcohol Use: Yes     Comment: ETOH on board.     Review of Systems  Respiratory: Positive for cough and shortness of breath.   Musculoskeletal: Positive for myalgias and arthralgias.  All  other systems reviewed and are negative.   Allergies  Aspirin  Home Medications   Prior to Admission medications   Medication Sig Start Date End Date Taking? Authorizing Provider  albuterol (PROVENTIL HFA;VENTOLIN HFA) 108 (90 BASE) MCG/ACT inhaler Inhale 2 puffs into the lungs every 6 (six) hours as needed for wheezing or shortness of breath (cough). 11/17/13   Earley Favor, NP  benzonatate (TESSALON) 100 MG capsule Take 1 capsule (100 mg total) by mouth every 8 (eight) hours. 07/15/15   Antony Madura, PA-C  chlordiazePOXIDE (LIBRIUM) 10 MG capsule Take 1 capsule (10 mg total) by mouth 3 (three) times daily. Patient not taking: Reported on 06/14/2015 03/23/15   Elpidio Anis, PA-C  clonazePAM (KLONOPIN) 1 MG tablet Take 1 tablet (1 mg total) by mouth 2 (two) times daily as needed for anxiety. 05/11/15   Ozella Rocks, MD  Darunavir Ethanolate (PREZISTA) 800 MG tablet Take 1 tablet (800 mg total) by mouth daily with breakfast. 11/17/13   Earley Favor, NP  dronabinol (MARINOL) 2.5 MG capsule Take 1 capsule (2.5 mg total) by mouth 2 (two) times daily. 05/03/15   Trixie Dredge, PA-C  emtricitabine-tenofovir (TRUVADA) 200-300 MG per tablet Take 1 tablet by mouth every morning. 11/17/13   Earley Favor, NP  fexofenadine (ALLEGRA) 180 MG tablet Take 180 mg by mouth daily.    Historical Provider, MD  HYDROcodone-acetaminophen (NORCO/VICODIN) 5-325 MG per tablet Take  1 tablet by mouth every 6 (six) hours as needed. Patient not taking: Reported on 06/14/2015 05/11/15   Ozella Rocks, MD  hydroxypropyl methylcellulose (ISOPTO TEARS) 2.5 % ophthalmic solution Place 1 drop into both eyes 3 (three) times daily as needed for dry eyes.    Historical Provider, MD  Multiple Vitamin (MULTIVITAMIN WITH MINERALS) TABS tablet Take 1 tablet by mouth daily.    Historical Provider, MD  oxyCODONE (ROXICODONE) 5 MG immediate release tablet Take 1 tablet (5 mg total) by mouth every 6 (six) hours as needed for severe pain. 06/14/15    Ladona Mow, PA-C  promethazine (PHENERGAN) 25 MG tablet Take 25 mg by mouth every 6 (six) hours as needed for nausea or vomiting.    Historical Provider, MD  ritonavir (NORVIR) 100 MG capsule Take 1 capsule (100 mg total) by mouth every morning. 11/17/13   Earley Favor, NP  sertraline (ZOLOFT) 50 MG tablet Take 50 mg by mouth daily.    Historical Provider, MD  sulfamethoxazole-trimethoprim (BACTRIM DS) 800-160 MG per tablet Take 1 tablet by mouth daily. 11/17/13   Earley Favor, NP   BP 111/76 mmHg  Pulse 70  Temp(Src) 98.1 F (36.7 C) (Oral)  Resp 20  Wt 165 lb (74.844 kg)  SpO2 100%   Physical Exam  Constitutional: He is oriented to person, place, and time. He appears well-developed and well-nourished. No distress.  Nontoxic/nonseptic appearing  HENT:  Head: Normocephalic and atraumatic.  4 staples to scalp  Eyes: Conjunctivae and EOM are normal. No scleral icterus.  Neck: Normal range of motion.  Cardiovascular: Normal rate, regular rhythm and intact distal pulses.   Pulmonary/Chest: Effort normal and breath sounds normal. No respiratory distress. He has no wheezes. He has no rales.  No accessory muscle use. Chest expansion symmetric. No rales, wheezes, or rhonchi.  Musculoskeletal: Normal range of motion.  Neurological: He is alert and oriented to person, place, and time. He exhibits normal muscle tone. Coordination normal.  Skin: Skin is warm and dry. No rash noted. He is not diaphoretic. No erythema. No pallor.  Psychiatric: He has a normal mood and affect. His behavior is normal. His speech is rapid and/or pressured.  Nursing note and vitals reviewed.   ED Course  Procedures (including critical care time) Labs Review Labs Reviewed - No data to display  Imaging Review No results found.   EKG Interpretation None       SUTURE REMOVAL Performed by: Antony Madura  Consent: Verbal consent obtained. Consent given by: patient Required items: required blood products,  implants, devices, and special equipment available Time out: Immediately prior to procedure a "time out" was called to verify the correct patient, procedure, equipment, support staff and site/side marked as required.  Location: scalp  Wound Appearance: clean  Sutures/Staples Removed: 4  Patient tolerance: Patient tolerated the procedure well with no immediate complications.   MDM   Final diagnoses:  Medication refill  Cough  Encounter for staple removal    49 year old male presents to the emergency department requesting refills of his Klonopin, oxycodone, and Marinol. Patient has been seen 3 times in the past 5 days for same. I've explained to the patient that we will not refill his prescriptions today and that he will need to follow up with a primary care provider in order to have these prescriptions refilled. Patient expresses concern over potential seizure from Klonopin use. I have explained to the patient that he has likely weaned himself off of the Klonopin; he  was given  tablet while in the ED on 07/10/15 with a 0.25mg  tablet given in the ED on 07/12/15 and patient was never prescribed these substances at discharge.  Patient with no cough appreciated at bedside. He has no fever, tachypnea, dyspnea, or hypoxia. Patient ambulatory in the ED without hypoxia. Lungs CTAB. Doubt PNA. Will prescribe Tessalon for c/o cough. Referral to [facility] given with resource guide. Return precautions provided. Patient discharged in good condition with no unaddressed concerns.   Filed Vitals:   07/15/15 0551 07/15/15 0645  BP: 182/105 111/76  Pulse: 78 70  Temp: 98.1 F (36.7 C)   TempSrc: Oral   Resp: 20   Weight: 165 lb (74.844 kg)   SpO2: 99% 100%     Antony Madura, PA-C 07/15/15 1610  Tomasita Crumble, MD 07/15/15 1537

## 2015-07-15 NOTE — ED Notes (Signed)
4 staples removed by PA.

## 2015-07-15 NOTE — ED Notes (Signed)
Ambulated approx 70 feet with sats remaining 98%, heart rate 90

## 2015-07-15 NOTE — Discharge Instructions (Signed)
Cough, Adult ° A cough is a reflex. It helps you clear your throat and airways. A cough can help heal your body. A cough can last 2 or 3 weeks (acute) or may last more than 8 weeks (chronic). Some common causes of a cough can include an infection, allergy, or a cold. °HOME CARE °· Only take medicine as told by your doctor. °· If given, take your medicines (antibiotics) as told. Finish them even if you start to feel better. °· Use a cold steam vaporizer or humidifier in your home. This can help loosen thick spit (secretions). °· Sleep so you are almost sitting up (semi-upright). Use pillows to do this. This helps reduce coughing. °· Rest as needed. °· Stop smoking if you smoke. °GET HELP RIGHT AWAY IF: °· You have yellowish-white fluid (pus) in your thick spit. °· Your cough gets worse. °· Your medicine does not reduce coughing, and you are losing sleep. °· You cough up blood. °· You have trouble breathing. °· Your pain gets worse and medicine does not help. °· You have a fever. °MAKE SURE YOU:  °· Understand these instructions. °· Will watch your condition. °· Will get help right away if you are not doing well or get worse. °Document Released: 08/03/2011 Document Revised: 04/06/2014 Document Reviewed: 08/03/2011 °ExitCare® Patient Information ©2015 [employer]. This information is not intended to replace advice given to you by your health care provider. Make sure you discuss any questions you have with your health care provider. ° °Emergency Department Resource Guide °1) Find a Doctor and Pay Out of Pocket °Although you won't have to find out who is covered by your insurance plan, it is a good idea to ask around and get recommendations. You will then need to call the office and see if the doctor you have chosen will accept you as a new patient and what types of options they offer for patients who are self-pay. Some doctors offer discounts or will set up payment plans for their patients who do not have insurance,  but you will need to ask so you aren't surprised when you get to your appointment. ° °2) Contact Your Local Health Department °Not all health departments have doctors that can see patients for sick visits, but many do, so it is worth a call to see if yours does. If you don't know where your local health department is, you can check in your phone book. The CDC also has a tool to help you locate your state's health department, and many state websites also have listings of all of their local health departments. ° °3) Find a Walk-in Clinic °If your illness is not likely to be very severe or complicated, you may want to try a walk in clinic. These are popping up all over the country in pharmacies, drugstores, and shopping centers. They're usually staffed by nurse practitioners or physician assistants that have been trained to treat common illnesses and complaints. They're usually fairly quick and inexpensive. However, if you have serious medical issues or chronic medical problems, these are probably not your best option. ° °No Primary Care Doctor: °- Call [facility] at  832-8000 - they can help you locate a primary care doctor that  accepts your insurance, provides certain services, etc. °- Physician Referral Service- 1-800-533-3463 ° °Chronic Pain Problems: °Organization         Address  Phone   Notes  °[facility]  (336) 297-2271 Patients need to be referred by   their primary care doctor.  ° °Medication Assistance: °Organization         Address  Phone   Notes  °[place] [facility] [address] °[place], [place] [address] (336) 641-8030 --Must be a resident of [place] °-- Must have NO insurance coverage whatsoever (no Medicaid/ Medicare, etc.) °-- The pt. MUST have a primary care doctor that directs their care regularly and follows them in the community °  °[facility]  (866) 331-1348   °[employer]  (888) 892-1162   ° °Agencies that provide inexpensive  medical care: °Organization         Address  Phone   Notes  °[facility]  (336) 832-8035   °[facility]    (336) 832-7272   °[facility] [address] °[place], [place] [address] (336) 832-4777   °[facility] [address], °[place] (336) 271-4999   °[facility]    (336) 373-0678   °[facility]    (336) 272-1050   °[facility] ° [address], [place] Phone:  (336) 832-4444, Fax:  (336) 832-4440 Hours of Operation:  9 am - 6 pm, M-F.  Also accepts Medicaid/Medicare and self-pay.  °[facility] ° [address], [place] Phone: (336) 832-3150, Fax: (336) 832-3151. Hours of Operation:  8:30 am - 5:30 pm, M-F.  Also accepts Medicaid and self-pay.  °[facility] [place] [address], [place] Phone: (336) 878-6027   °[facility] [address], [place], [place] (336)723-1848, Ext. 123 Mondays & Thursdays: 7-9 AM.  First 15 patients are seen on a first come, first serve basis. °  ° °Medicaid-accepting [place] Providers: ° °Organization         Address  Phone   Notes  °[facility] [address], [place] (336) 641-2100 Also accepts self-pay patients.  °[facility] [address], [place] ° (336) 856-9996   °[facility] [address], [place] (336) 288-8857   °[facility] [address] [place] [address], [place] (336) 299-7000   °Veita Bland [address], [place]  ° (336) 373-1557 Only accepts [place] Medicaid patients after they have their name applied to their card.  ° °Self-Pay (no insurance) in [place]: ° °Organization         Address  Phone   Notes  °Sickle Cell Patients, [facility] [address], [place] (336) 832-1970   °[facility] [address], [place] (336) 832-4400   °[facility] ° [address], [place] (336) 992-4800   °[facility]/Dr. Osei-Bonsu ° [address] [place] [address], [place] or [address], [place] (336) 841-8500 Phone number for both [place] and [place] locations is the same.  °[facility] [address], [place] (336) 299-0000   °[facility] [address] [place] [address], [place] or [address] (336) 852-7530 °(336) 878-2260   °[facility] [address], [place] (336) 350-1642, phone; (336) 294-5005, fax Sees patients 1st and 3rd Saturday of every month.  Must not qualify for public or private insurance (i.e. Medicaid, Medicare, [place] Health Choice, Veterans' Benefits) • Household income should be no more than   200% of the poverty level •The clinic cannot treat you if you are pregnant or think you are pregnant • Sexually transmitted diseases are not treated at the clinic.  ° ° °Dental Care: °Organization         Address  Phone  Notes  °[facility] [address], [place] (336) 641-6152 Accepts children up to age 21 who are enrolled in Medicaid or [place] Health Choice; pregnant women with a Medicaid card; and children who have applied for Medicaid or [place] Health Choice, but were declined, whose parents can pay a reduced fee at time of service.  °[facility] [place]  [address], [place] (336) 641-7733 Accepts children up to age 21 who are enrolled in Medicaid or [place] Health Choice; pregnant women with a Medicaid card; and children who have applied for Medicaid or [place] Health Choice, but were declined, whose parents can pay a reduced fee at time of service.  °[facility] ° [address], [place] (336) 641-4533 Patients are seen by appointment only. Walk-ins are not accepted.  [facility] will see patients 18 years of age and older. °Monday - Tuesday (8am-5pm) °Most Wednesdays (8:30-5pm) °$30 per visit, cash only  °[facility] ° [address], [place] (336) 641-4533 Patients are seen by appointment only. Walk-ins are not accepted. [facility] will see patients 18 years of age and older. °One Wednesday Evening (Monthly: Volunteer Based).  $30 per visit, cash only  °[facility]  (919) 537-3737 for adults; Children under age 4, call Graduate Pediatric Dentistry at (919) [phone]. Children aged 4-14, please call (919) [phone] to request a pediatric application. ° Dental services are provided in all areas of dental care including fillings, crowns and bridges, complete and partial dentures, implants, gum treatment, root canals, and extractions. Preventive care is also provided. Treatment is provided to both adults and children. °Patients are selected via a lottery and there is often a waiting list. °  °[facility] [address], ° ° (336) 763-8833 [email or web address] °  °[facility] [address], [place], [place] (336)723-1848, Ext. 123 Second and Fourth Thursday of each month, opens at 6:30 AM; Clinic ends at 9 AM.  Patients are seen on a first-come first-served basis, and a limited number are seen during each clinic.  ° °[facility] ° [address], [place], [place] (336) 723-7904   Eligibility Requirements °You must have lived in [place], [place], or [place] counties for at least the last three months. °  You cannot be eligible for state or federal sponsored healthcare insurance, including Veterans Administration, Medicaid, or Medicare. °  You generally cannot be eligible for healthcare insurance through your employer.  °  How to apply: °Eligibility screenings are held every Tuesday and Wednesday afternoon from 1:00 pm until 4:00 pm. You do not need an appointment for the  interview!  °[facility] [address], [place], [place] 336-631-2330   °[facility]  336-342-8273   °[facility]  336-703-3100   °[facility]  336-570-6415   ° °Behavioral Health Resources in the Community: °Intensive Outpatient Programs °Organization         Address  Phone  Notes  °[facility] [address], [place], [place] 336-878-6098   °[facility]   Outpatient [address], [place], [place] 336-832-9800   °[facility] [address], [place], [place] ° 336-882-2125   °[facility] [address],  °[place], [place] 1-800-853-5163 or 336-641-4981   °Substance Abuse Resources °Organization         Address  Phone  Notes  °[facility]  336-882-2125   °[facility]  336-784-9470   °[facility]  336-285-9073   °[facility]  336-845-3988   °Residential & Outpatient Substance Abuse Program  1-800-659-3381   °Psychological Services °Organization         Address  Phone  Notes  °[facility]  336- 832-9600   °[facility]  336- 378-7881   °[facility] [address], [place] 1-800-853-5163 or 336-641-4981   ° °Mobile Crisis Teams °Organization         Address  Phone  Notes  °[facility], Mobile Crisis Care Unit  1-877-626-1772   °Assertive °[facility] ° [address] [place], [place] 336-834-9664   °Sharon DeEsch [address] °[place] 336-554-5454   ° °Self-Help/Support Groups °Organization         Address  Phone             Notes  °[facility] - variety of support groups  336- 373-1402 Call for more information  °Narcotics Anonymous (NA), [facility] [address], °[place]  2 meetings at this location  ° °Residential Treatment Programs °Organization         Address  Phone  Notes  °[facility]  [address],    °[place]  1-866-801-8205   °[facility] ° [address], [place], [place] 704-293-8524   °[facility] [address], [place] 336-845-3988 Admissions: 8am-3pm M-F  °[facility] [address]    °[place], [place] 336-841-1104   °[facility] [address], [place], [place] 336-379-7146   °[facility] [address]  °[place], [place] 336-285-9073   °[facility] - Intensive Outpatient [address], [place], [place] 336-852-3033   °[facility] [address]  °[place], [place] 1-877-615-2722 or 336-784-9470   °[facility] [address] [place], [place] 336-227-7417 Accepts Medicaid  °[facility] [address]  °[place] 1-800-659-3381 Substance Abuse/Addiction Treatment  ° °[place] Behavioral Health Resources °Organization         Address  Phone  Notes  °[facility]  (888) 581-9988   °Julie Brannon, PhD [address] [place], [place]   (336) 349-5553 or (336) 951-0000   °[facility]   [address] °[place], [place] (336) 349-4454   °[facility] [address], [place], [place] (336) 342-8316 Insurance/Medicaid/sponsorship through [facility]  °[facility] [address]                                    [place], [place] (336) 342-8316 Therapy/tele-psych/case  °[facility] [address]  ° [place], [place] (336) 349-2233    °Dr. Arfeen  (336) 349-4544   °[facility] [place]  [employer] [place] [facility] 1) [address], [place] °2) [address], [place] °3)  [address], [place] (336) 349-3220 °(336) 342-7768 ° °(336) 342-8140   °[place] [facility] (336) 342-1394 or (336) 342-3537 (After Hours)    ° ° ° °

## 2015-07-26 ENCOUNTER — Inpatient Hospital Stay (HOSPITAL_COMMUNITY)
Admission: EM | Admit: 2015-07-26 | Discharge: 2015-07-28 | DRG: 154 | Disposition: A | Discharge status: Home / Self Care | Payer: Self-pay | Attending: Internal Medicine | Admitting: Internal Medicine

## 2015-07-26 ENCOUNTER — Encounter (HOSPITAL_COMMUNITY): Payer: Self-pay

## 2015-07-26 ENCOUNTER — Emergency Department (HOSPITAL_COMMUNITY): Payer: Self-pay

## 2015-07-26 DIAGNOSIS — F419 Anxiety disorder, unspecified: Secondary | ICD-10-CM | POA: Diagnosis present

## 2015-07-26 DIAGNOSIS — Z8249 Family history of ischemic heart disease and other diseases of the circulatory system: Secondary | ICD-10-CM

## 2015-07-26 DIAGNOSIS — K6282 Dysplasia of anus: Secondary | ICD-10-CM | POA: Diagnosis present

## 2015-07-26 DIAGNOSIS — S1121XA Laceration without foreign body of pharynx and cervical esophagus, initial encounter: Principal | ICD-10-CM | POA: Diagnosis present

## 2015-07-26 DIAGNOSIS — D61818 Other pancytopenia: Secondary | ICD-10-CM | POA: Diagnosis present

## 2015-07-26 DIAGNOSIS — D696 Thrombocytopenia, unspecified: Secondary | ICD-10-CM

## 2015-07-26 DIAGNOSIS — K921 Melena: Secondary | ICD-10-CM | POA: Insufficient documentation

## 2015-07-26 DIAGNOSIS — Z833 Family history of diabetes mellitus: Secondary | ICD-10-CM

## 2015-07-26 DIAGNOSIS — K920 Hematemesis: Secondary | ICD-10-CM | POA: Diagnosis present

## 2015-07-26 DIAGNOSIS — B182 Chronic viral hepatitis C: Secondary | ICD-10-CM | POA: Diagnosis present

## 2015-07-26 DIAGNOSIS — Z823 Family history of stroke: Secondary | ICD-10-CM

## 2015-07-26 DIAGNOSIS — F17210 Nicotine dependence, cigarettes, uncomplicated: Secondary | ICD-10-CM | POA: Diagnosis present

## 2015-07-26 DIAGNOSIS — Y9389 Activity, other specified: Secondary | ICD-10-CM

## 2015-07-26 DIAGNOSIS — M545 Low back pain: Secondary | ICD-10-CM | POA: Diagnosis present

## 2015-07-26 DIAGNOSIS — K219 Gastro-esophageal reflux disease without esophagitis: Secondary | ICD-10-CM | POA: Diagnosis present

## 2015-07-26 DIAGNOSIS — K922 Gastrointestinal hemorrhage, unspecified: Secondary | ICD-10-CM | POA: Diagnosis present

## 2015-07-26 DIAGNOSIS — D62 Acute posthemorrhagic anemia: Secondary | ICD-10-CM

## 2015-07-26 DIAGNOSIS — F1010 Alcohol abuse, uncomplicated: Secondary | ICD-10-CM | POA: Diagnosis present

## 2015-07-26 DIAGNOSIS — D649 Anemia, unspecified: Secondary | ICD-10-CM

## 2015-07-26 DIAGNOSIS — G8929 Other chronic pain: Secondary | ICD-10-CM | POA: Diagnosis present

## 2015-07-26 DIAGNOSIS — D66 Hereditary factor VIII deficiency: Secondary | ICD-10-CM | POA: Diagnosis present

## 2015-07-26 DIAGNOSIS — B20 Human immunodeficiency virus [HIV] disease: Secondary | ICD-10-CM | POA: Diagnosis present

## 2015-07-26 HISTORY — DX: Hereditary factor VIII deficiency: D66

## 2015-07-26 LAB — CBC WITH DIFFERENTIAL/PLATELET
Basophils Absolute: 0 10*3/uL (ref 0.0–0.1)
Basophils Relative: 0 % (ref 0–1)
EOS ABS: 0 10*3/uL (ref 0.0–0.7)
Eosinophils Relative: 1 % (ref 0–5)
HEMATOCRIT: 21.6 % — AB (ref 39.0–52.0)
HEMOGLOBIN: 7.5 g/dL — AB (ref 13.0–17.0)
Lymphocytes Relative: 21 % (ref 12–46)
Lymphs Abs: 0.4 10*3/uL — ABNORMAL LOW (ref 0.7–4.0)
MCH: 34.1 pg — ABNORMAL HIGH (ref 26.0–34.0)
MCHC: 34.7 g/dL (ref 30.0–36.0)
MCV: 98.2 fL (ref 78.0–100.0)
MONO ABS: 0.5 10*3/uL (ref 0.1–1.0)
MONOS PCT: 26 % — AB (ref 3–12)
NEUTROS ABS: 1 10*3/uL — AB (ref 1.7–7.7)
NEUTROS PCT: 52 % (ref 43–77)
Platelets: 50 10*3/uL — ABNORMAL LOW (ref 150–400)
RBC: 2.2 MIL/uL — ABNORMAL LOW (ref 4.22–5.81)
RDW: 12.9 % (ref 11.5–15.5)
WBC: 1.9 10*3/uL — ABNORMAL LOW (ref 4.0–10.5)

## 2015-07-26 LAB — RAPID URINE DRUG SCREEN, HOSP PERFORMED
Amphetamines: NOT DETECTED
BARBITURATES: POSITIVE — AB
BENZODIAZEPINES: NOT DETECTED
Cocaine: NOT DETECTED
Opiates: NOT DETECTED
TETRAHYDROCANNABINOL: POSITIVE — AB

## 2015-07-26 LAB — TROPONIN I
TROPONIN I: 0.03 ng/mL (ref <0.031)
Troponin I: <0.03 ng/mL (ref <0.031)

## 2015-07-26 LAB — PROTIME-INR
INR: 1.18 (ref 0.00–1.49)
Prothrombin Time: 15.2 seconds (ref 11.6–15.2)

## 2015-07-26 LAB — COMPREHENSIVE METABOLIC PANEL
ALT: 110 U/L — ABNORMAL HIGH (ref 17–63)
ANION GAP: 4 — AB (ref 5–15)
AST: 178 U/L — AB (ref 15–41)
Albumin: 2.9 g/dL — ABNORMAL LOW (ref 3.5–5.0)
Alkaline Phosphatase: 75 U/L (ref 38–126)
BUN: 40 mg/dL — AB (ref 6–20)
CHLORIDE: 102 mmol/L (ref 101–111)
CO2: 30 mmol/L (ref 22–32)
Calcium: 8.8 mg/dL — ABNORMAL LOW (ref 8.9–10.3)
Creatinine, Ser: 0.95 mg/dL (ref 0.61–1.24)
GFR calc Af Amer: >60 mL/min (ref >60)
GFR calc non Af Amer: >60 mL/min (ref >60)
GLUCOSE: 103 mg/dL — AB (ref 65–99)
POTASSIUM: 4.4 mmol/L (ref 3.5–5.1)
SODIUM: 136 mmol/L (ref 135–145)
Total Bilirubin: 1.1 mg/dL (ref 0.3–1.2)
Total Protein: 6 g/dL — ABNORMAL LOW (ref 6.5–8.1)

## 2015-07-26 LAB — TSH: TSH: 1.022 u[IU]/mL (ref 0.350–4.500)

## 2015-07-26 LAB — PREPARE RBC (CROSSMATCH)

## 2015-07-26 LAB — ABO/RH: ABO/RH(D): O POS

## 2015-07-26 LAB — MRSA PCR SCREENING: MRSA by PCR: NEGATIVE

## 2015-07-26 LAB — MAGNESIUM: Magnesium: 2.1 mg/dL (ref 1.7–2.4)

## 2015-07-26 LAB — ETHANOL

## 2015-07-26 LAB — POC OCCULT BLOOD, ED: FECAL OCCULT BLD: POSITIVE — AB

## 2015-07-26 MED ORDER — SODIUM CHLORIDE 0.9 % IV SOLN
INTRAVENOUS | Status: DC
  Administered 2015-07-26 (×2): via INTRAVENOUS
  Administered 2015-07-27 (×2): 100 mL/h via INTRAVENOUS
  Administered 2015-07-28: 02:00:00 via INTRAVENOUS

## 2015-07-26 MED ORDER — SERTRALINE HCL 50 MG PO TABS: 50.0000 mg | ORAL_TABLET | Freq: Every day | ORAL | Status: DC

## 2015-07-26 MED ORDER — SODIUM CHLORIDE 0.9 % IJ SOLN
3.0000 mL | Freq: Two times a day (BID) | INTRAMUSCULAR | Status: DC
  Administered 2015-07-26 – 2015-07-27 (×3): 3 mL via INTRAVENOUS

## 2015-07-26 MED ORDER — LORAZEPAM 2 MG/ML IJ SOLN
1.0000 mg | Freq: Four times a day (QID) | INTRAMUSCULAR | Status: DC | PRN
  Filled 2015-07-26: qty 1

## 2015-07-26 MED ORDER — DARUNAVIR ETHANOLATE 800 MG PO TABS
800.0000 mg | ORAL_TABLET | Freq: Every day | ORAL | Status: DC
  Administered 2015-07-27 – 2015-07-28 (×2): 800 mg via ORAL
  Filled 2015-07-26 (×3): qty 1

## 2015-07-26 MED ORDER — ACETAMINOPHEN 325 MG PO TABS: 650.0000 mg | ORAL_TABLET | Freq: Four times a day (QID) | ORAL | Status: DC | PRN

## 2015-07-26 MED ORDER — FAMOTIDINE IN NACL 20-0.9 MG/50ML-% IV SOLN
20.0000 mg | Freq: Once | INTRAVENOUS | Status: AC
  Administered 2015-07-26: 20 mg via INTRAVENOUS
  Filled 2015-07-26: qty 50

## 2015-07-26 MED ORDER — NICOTINE 14 MG/24HR TD PT24
14.0000 mg | MEDICATED_PATCH | Freq: Every day | TRANSDERMAL | Status: DC
  Administered 2015-07-26 – 2015-07-28 (×3): 14 mg via TRANSDERMAL
  Filled 2015-07-26 (×4): qty 1

## 2015-07-26 MED ORDER — LORAZEPAM 2 MG/ML IJ SOLN
INTRAMUSCULAR | Status: AC
  Administered 2015-07-26: 2 mg via INTRAVENOUS
  Filled 2015-07-26: qty 1

## 2015-07-26 MED ORDER — PANTOPRAZOLE SODIUM 40 MG IV SOLR: 40.0000 mg | Freq: Two times a day (BID) | INTRAVENOUS | Status: DC

## 2015-07-26 MED ORDER — ONDANSETRON HCL 4 MG/2ML IJ SOLN
4.0000 mg | Freq: Four times a day (QID) | INTRAMUSCULAR | Status: DC | PRN
  Administered 2015-07-26: 4 mg via INTRAVENOUS
  Filled 2015-07-26: qty 2

## 2015-07-26 MED ORDER — ACETAMINOPHEN 650 MG RE SUPP: 650.0000 mg | Freq: Four times a day (QID) | RECTAL | Status: DC | PRN

## 2015-07-26 MED ORDER — SULFAMETHOXAZOLE-TMP DS 800-160 MG PO TABS
1.0000 | ORAL_TABLET | Freq: Every day | ORAL | Status: DC
  Administered 2015-07-27: 1 via ORAL
  Filled 2015-07-26: qty 1

## 2015-07-26 MED ORDER — LORAZEPAM 2 MG/ML IJ SOLN
2.0000 mg | Freq: Once | INTRAMUSCULAR | Status: AC
  Administered 2015-07-26: 2 mg via INTRAVENOUS

## 2015-07-26 MED ORDER — LORAZEPAM 2 MG/ML IJ SOLN: 0.0000 mg | Freq: Two times a day (BID) | INTRAMUSCULAR | Status: DC

## 2015-07-26 MED ORDER — RITONAVIR 100 MG PO CAPS
100.0000 mg | ORAL_CAPSULE | Freq: Every day | ORAL | Status: DC
  Administered 2015-07-27 – 2015-07-28 (×2): 100 mg via ORAL
  Filled 2015-07-26 (×3): qty 1

## 2015-07-26 MED ORDER — ALBUTEROL SULFATE HFA 108 (90 BASE) MCG/ACT IN AERS: 2.0000 | INHALATION_SPRAY | Freq: Four times a day (QID) | RESPIRATORY_TRACT | Status: DC | PRN

## 2015-07-26 MED ORDER — LEVALBUTEROL HCL 0.63 MG/3ML IN NEBU
0.6300 mg | INHALATION_SOLUTION | Freq: Four times a day (QID) | RESPIRATORY_TRACT | Status: DC
  Administered 2015-07-27 (×3): 0.63 mg via RESPIRATORY_TRACT
  Filled 2015-07-26 (×6): qty 3

## 2015-07-26 MED ORDER — ONDANSETRON HCL 4 MG PO TABS: 4.0000 mg | ORAL_TABLET | Freq: Four times a day (QID) | ORAL | Status: DC | PRN

## 2015-07-26 MED ORDER — FOLIC ACID 1 MG PO TABS
1.0000 mg | ORAL_TABLET | Freq: Every day | ORAL | Status: DC
  Administered 2015-07-26 – 2015-07-28 (×3): 1 mg via ORAL
  Filled 2015-07-26 (×3): qty 1

## 2015-07-26 MED ORDER — LORAZEPAM 2 MG/ML IJ SOLN
0.0000 mg | Freq: Four times a day (QID) | INTRAMUSCULAR | Status: DC
  Administered 2015-07-27: 2 mg via INTRAVENOUS

## 2015-07-26 MED ORDER — THIAMINE HCL 100 MG/ML IJ SOLN
100.0000 mg | Freq: Every day | INTRAMUSCULAR | Status: DC
  Filled 2015-07-26: qty 1
  Filled 2015-07-26: qty 2

## 2015-07-26 MED ORDER — PANTOPRAZOLE SODIUM 40 MG IV SOLR
40.0000 mg | Freq: Once | INTRAVENOUS | Status: AC
  Administered 2015-07-26: 40 mg via INTRAVENOUS
  Filled 2015-07-26: qty 40

## 2015-07-26 MED ORDER — HYDROMORPHONE HCL 1 MG/ML IJ SOLN
0.5000 mg | INTRAMUSCULAR | Status: DC | PRN
  Administered 2015-07-26 – 2015-07-28 (×9): 0.5 mg via INTRAVENOUS
  Filled 2015-07-26 (×9): qty 1

## 2015-07-26 MED ORDER — ADULT MULTIVITAMIN W/MINERALS CH
1.0000 | ORAL_TABLET | Freq: Every day | ORAL | Status: DC
  Administered 2015-07-26 – 2015-07-28 (×3): 1 via ORAL
  Filled 2015-07-26 (×3): qty 1

## 2015-07-26 MED ORDER — SODIUM CHLORIDE 0.9 % IV SOLN
50.0000 ug/h | INTRAVENOUS | Status: DC
  Administered 2015-07-26 – 2015-07-28 (×4): 50 ug/h via INTRAVENOUS
  Filled 2015-07-26 (×10): qty 1

## 2015-07-26 MED ORDER — BENZONATATE 100 MG PO CAPS: 100.0000 mg | ORAL_CAPSULE | Freq: Three times a day (TID) | ORAL | Status: DC

## 2015-07-26 MED ORDER — DRONABINOL 2.5 MG PO CAPS: 2.5000 mg | ORAL_CAPSULE | Freq: Two times a day (BID) | ORAL | Status: DC

## 2015-07-26 MED ORDER — SODIUM CHLORIDE 0.9 % IV SOLN
8.0000 mg/h | INTRAVENOUS | Status: DC
  Administered 2015-07-26 – 2015-07-28 (×4): 8 mg/h via INTRAVENOUS
  Filled 2015-07-26 (×9): qty 80

## 2015-07-26 MED ORDER — EMTRICITABINE-TENOFOVIR DF 200-300 MG PO TABS
1.0000 | ORAL_TABLET | Freq: Every morning | ORAL | Status: DC
  Administered 2015-07-27 – 2015-07-28 (×2): 1 via ORAL
  Filled 2015-07-26 (×2): qty 1

## 2015-07-26 MED ORDER — LORAZEPAM 1 MG PO TABS
1.0000 mg | ORAL_TABLET | Freq: Four times a day (QID) | ORAL | Status: DC | PRN
  Administered 2015-07-26: 1 mg via ORAL
  Filled 2015-07-26: qty 1

## 2015-07-26 MED ORDER — SODIUM CHLORIDE 0.9 % IV SOLN
80.0000 mg | Freq: Once | INTRAVENOUS | Status: AC
  Administered 2015-07-26: 80 mg via INTRAVENOUS
  Filled 2015-07-26: qty 80

## 2015-07-26 MED ORDER — VITAMIN B-1 100 MG PO TABS
100.0000 mg | ORAL_TABLET | Freq: Every day | ORAL | Status: DC
  Administered 2015-07-26 – 2015-07-28 (×3): 100 mg via ORAL
  Filled 2015-07-26 (×3): qty 1

## 2015-07-26 MED ORDER — SODIUM CHLORIDE 0.9 % IV SOLN: 10.0000 mL/h | Freq: Once | INTRAVENOUS | Status: AC

## 2015-07-26 NOTE — Progress Notes (Addendum)
Pt informed cm he has had his medical records transferred to [facility] and has them for pcp and specialist services  EPIC indicates pt had and appt on 06/14/15 with ID  Pt has an upcoming visit with Dr Daiva Eves on 08/02/15 at 0900 CM entered this information in EPIC d/c instruction section for pt f/u visit
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
## 2015-07-26 NOTE — ED Notes (Signed)
Patient transported to X-ray 

## 2015-07-26 NOTE — ED Notes (Addendum)
Spoke to [facility] PA [employer].  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

## 2015-07-26 NOTE — ED Notes (Signed)
Bib ems c/o vomiting since 6am. Ems states coffee ground and bright red blood. Pt hemophiliac. Alert x4 respirations easy non labored.

## 2015-07-26 NOTE — ED Provider Notes (Signed)
CSN: [id number]     Arrival date & time 07/26/15  1242 History   First MD Initiated Contact with Patient 07/26/15 1248     Chief Complaint  Patient presents with  . GI Bleeding     (Consider location/radiation/quality/duration/timing/severity/associated sxs/prior Treatment) HPI   49 year old male with history of HIV, hepatitis C, and asthma presents to the emergency department by EMS for vomiting dark tarry vomit and having a dark bowel movement. The patient reports it started this morning after drinking a glass of milk which he does every morning. He also had a few episodes of dark emesis in the ambulance as well. He reports having lots of complications and problems because of his hepatitis and HIV. He has been complaint with his HIV medications. He has had frequent visits requesting medication refills in the ED. He wants albuterol, Marinol, Zoloft, Tessalon Perls. He is due to see infectious dz in two weeks here in [place], [place]. He is awake, alert oriented and is well appearing. Denies actively bleeding from rectum. Not actively vomiting at this time. No cp or abdominal pain. Most recent CD4 count 200 in April 2016.  Past Medical History  Diagnosis Date  . HIV (human immunodeficiency virus infection)   . Hepatitis C   . Asthma   . AIDS   . Cerebral hemorrhage   . Hemophilia    Past Surgical History  Procedure Laterality Date  . Hernia repair    . Mandible surgery    . Laporotomy     Family History  Problem Relation Age of Onset  . CAD Mother   . Stroke Mother    Social History  Substance Use Topics  . Smoking status: Current Every Day Smoker -- 2.00 packs/day for 38 years    Types: Cigarettes  . Smokeless tobacco: Never Used  . Alcohol Use: Yes     Comment: ETOH on board.     Review of Systems  10 Systems reviewed and are negative for acute change except as noted in the HPI.   Allergies  Aspirin  Home Medications   Prior to Admission medications   Medication  Sig Start Date End Date Taking? Authorizing Provider  clonazePAM (KLONOPIN) 1 MG tablet Take 1 tablet (1 mg total) by mouth 2 (two) times daily as needed for anxiety. 05/11/15  Yes Ozella Rocks, MD  Darunavir Ethanolate (PREZISTA) 800 MG tablet Take 1 tablet (800 mg total) by mouth daily with breakfast. 11/17/13  Yes Earley Favor, NP  diphenhydrAMINE (BENADRYL) 25 mg capsule Take 25 mg by mouth every 6 (six) hours as needed for allergies.   Yes Historical Provider, MD  emtricitabine-tenofovir (TRUVADA) 200-300 MG per tablet Take 1 tablet by mouth every morning. 11/17/13  Yes Earley Favor, NP  fexofenadine (ALLEGRA) 180 MG tablet Take 180 mg by mouth daily.   Yes Historical Provider, MD  hydroxypropyl methylcellulose (ISOPTO TEARS) 2.5 % ophthalmic solution Place 1 drop into both eyes 3 (three) times daily as needed for dry eyes.   Yes Historical Provider, MD  Multiple Vitamin (MULTIVITAMIN WITH MINERALS) TABS tablet Take 1 tablet by mouth daily.   Yes Historical Provider, MD  oxyCODONE (ROXICODONE) 5 MG immediate release tablet Take 1 tablet (5 mg total) by mouth every 6 (six) hours as needed for severe pain. 06/14/15  Yes Joe Rexanne Mano, PA-C  ritonavir (NORVIR) 100 MG capsule Take 1 capsule (100 mg total) by mouth every morning. 11/17/13  Yes Earley Favor, NP  albuterol (PROVENTIL HFA;VENTOLIN HFA) 108 (90  BASE) MCG/ACT inhaler Inhale 2 puffs into the lungs every 6 (six) hours as needed for wheezing or shortness of breath (cough). 07/26/15   Gibril Mastro Neva Seat, PA-C  benzonatate (TESSALON) 100 MG capsule Take 1 capsule (100 mg total) by mouth every 8 (eight) hours. 07/26/15   Marlon Pel, PA-C  chlordiazePOXIDE (LIBRIUM) 10 MG capsule Take 1 capsule (10 mg total) by mouth 3 (three) times daily. Patient not taking: Reported on 06/14/2015 03/23/15   Elpidio Anis, PA-C  HYDROcodone-acetaminophen (NORCO/VICODIN) 5-325 MG per tablet Take 1 tablet by mouth every 6 (six) hours as needed. Patient not taking: Reported  on 06/14/2015 05/11/15   Ozella Rocks, MD  sertraline (ZOLOFT) 50 MG tablet Take 1 tablet (50 mg total) by mouth daily. 07/26/15   Marlon Pel, PA-C  sulfamethoxazole-trimethoprim (BACTRIM DS) 800-160 MG per tablet Take 1 tablet by mouth daily. Patient not taking: Reported on 07/26/2015 11/17/13   Earley Favor, NP   BP 115/73 mmHg  Pulse 70  Temp(Src) 98 F (36.7 C) (Oral)  Resp 18  SpO2 100% Physical Exam  Constitutional: He appears well-developed and well-nourished. No distress.  HENT:  Head: Normocephalic and atraumatic.  Eyes: Pupils are equal, round, and reactive to light.  Neck: Normal range of motion. Neck supple.  Cardiovascular: Normal rate and regular rhythm.   Pulmonary/Chest: Effort normal.  Abdominal: Soft. Bowel sounds are normal. He exhibits no distension. There is no tenderness. There is no rigidity, no rebound and no guarding.  Genitourinary: Guaiac positive stool.  Dark stool per rectum. No hemorrhoids, no BRB. He is not actively passing blood through bowels.  Neurological: He is alert.  Skin: Skin is warm and dry. He is not diaphoretic. No pallor.  Nursing note and vitals reviewed.   ED Course  Procedures (including critical care time) Labs Review Labs Reviewed  CBC WITH DIFFERENTIAL/PLATELET - Abnormal; Notable for the following:    WBC 1.9 (*)    RBC 2.20 (*)    Hemoglobin 7.5 (*)    HCT 21.6 (*)    MCH 34.1 (*)    Platelets 50 (*)    Neutro Abs 1.0 (*)    Lymphs Abs 0.4 (*)    Monocytes Relative 26 (*)    All other components within normal limits  COMPREHENSIVE METABOLIC PANEL - Abnormal; Notable for the following:    Glucose, Bld 103 (*)    BUN 40 (*)    Calcium 8.8 (*)    Total Protein 6.0 (*)    Albumin 2.9 (*)    AST 178 (*)    ALT 110 (*)    Anion gap 4 (*)    All other components within normal limits  POC OCCULT BLOOD, ED - Abnormal; Notable for the following:    Fecal Occult Bld POSITIVE (*)    All other components within normal  limits  PROTIME-INR  ETHANOL  OCCULT BLOOD X 1 CARD TO LAB, STOOL  URINE RAPID DRUG SCREEN, HOSP PERFORMED  TYPE AND SCREEN  ABO/RH  PREPARE RBC (CROSSMATCH)    Imaging Review Dg Abd Acute W/chest  07/26/2015   CLINICAL DATA:  GI bleed, vomiting, history HIV/AIDS, hepatitis-C, asthma, smoker  EXAM: DG ABDOMEN ACUTE W/ 1V CHEST  COMPARISON:  Chest radiograph 06/15/2015  FINDINGS:  Normal heart size, mediastinal contours and pulmonary vascularity.  Atherosclerotic calcification aorta.  Lungs appear hyperaerated with minimal central peribronchial thickening.  No infiltrate, pleural effusion or pneumothorax.  Nonobstructive bowel gas pattern.  No bowel dilatation, bowel wall thickening  or free air.  Small pelvic phleboliths.  Levoconvex lumbar scoliosis without acute osseous abnormalities.  No urinary tract calcification.  IMPRESSION: Hyperaeration and minimal peribronchial thickening question bronchitis.  Normal bowel gas pattern.   Electronically Signed   By: Ulyses Southward M.D.   On: 07/26/2015 14:28   I have personally reviewed and evaluated these images and lab results as part of my medical decision-making.   EKG Interpretation None      MDM   Final diagnoses:  Anemia requiring transfusions  Gastrointestinal hemorrhage with melena   CRITICAL CARE Performed by: Dorthula Matas Total critical care time: 30 Critical care time was exclusive of separately billable procedures and treating other patients. Critical care was necessary to treat or prevent imminent or life-threatening deterioration. Critical care was time spent personally by me on the following activities: development of treatment plan with patient and/or surrogate as well as nursing, discussions with consultants, evaluation of patient's response to treatment, examination of patient, obtaining history from patient or surrogate, ordering and performing treatments and interventions, ordering and review of laboratory studies,  ordering and review of radiographic studies, pulse oximetry and re-evaluation of patient's condition.   Hemoccult positive, hemoglobin has dropped to 7.5 from 12.2  One month ago.  He continues to be pain free and hemodynamically stable. Dr. Chauncy Passy made aware of findings -- will order to transfuse 2 units.  [facility] has agreed to come see patient Dr. Susie Cassette with [facility] has agreed to admit and declined Temp admit orders.  Filed Vitals:   07/26/15 1437  BP: 115/73  Pulse: 70  Temp:   Resp: [address] PA-C 07/26/15 1514  Blane Ohara, MD 07/26/15 1547
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
## 2015-07-27 ENCOUNTER — Encounter (HOSPITAL_COMMUNITY)
Admission: EM | Disposition: A | Discharge status: Home / Self Care | Payer: Self-pay | Source: Home / Self Care | Attending: Internal Medicine

## 2015-07-27 ENCOUNTER — Inpatient Hospital Stay (HOSPITAL_COMMUNITY): Payer: Self-pay | Admitting: Anesthesiology

## 2015-07-27 ENCOUNTER — Encounter (HOSPITAL_COMMUNITY): Payer: Self-pay

## 2015-07-27 ENCOUNTER — Telehealth: Payer: Self-pay

## 2015-07-27 DIAGNOSIS — K921 Melena: Secondary | ICD-10-CM | POA: Insufficient documentation

## 2015-07-27 DIAGNOSIS — S1121XA Laceration without foreign body of pharynx and cervical esophagus, initial encounter: Principal | ICD-10-CM | POA: Insufficient documentation

## 2015-07-27 HISTORY — PX: ESOPHAGOGASTRODUODENOSCOPY (EGD) WITH PROPOFOL: SHX5813

## 2015-07-27 LAB — COMPREHENSIVE METABOLIC PANEL
ALK PHOS: 54 U/L (ref 38–126)
ALT: 96 U/L — AB (ref 17–63)
AST: 142 U/L — ABNORMAL HIGH (ref 15–41)
Albumin: 2.8 g/dL — ABNORMAL LOW (ref 3.5–5.0)
Anion gap: 4 — ABNORMAL LOW (ref 5–15)
BUN: 36 mg/dL — ABNORMAL HIGH (ref 6–20)
CALCIUM: 7.6 mg/dL — AB (ref 8.9–10.3)
CO2: 26 mmol/L (ref 22–32)
CREATININE: 1.04 mg/dL (ref 0.61–1.24)
Chloride: 105 mmol/L (ref 101–111)
GFR calc non Af Amer: >60 mL/min (ref >60)
GLUCOSE: 116 mg/dL — AB (ref 65–99)
Potassium: 4.6 mmol/L (ref 3.5–5.1)
SODIUM: 135 mmol/L (ref 135–145)
Total Bilirubin: 1.8 mg/dL — ABNORMAL HIGH (ref 0.3–1.2)
Total Protein: 5.6 g/dL — ABNORMAL LOW (ref 6.5–8.1)

## 2015-07-27 LAB — CBC
HCT: 21.8 % — ABNORMAL LOW (ref 39.0–52.0)
HCT: 23.1 % — ABNORMAL LOW (ref 39.0–52.0)
HEMOGLOBIN: 7.9 g/dL — AB (ref 13.0–17.0)
Hemoglobin: 7.7 g/dL — ABNORMAL LOW (ref 13.0–17.0)
MCH: 33.3 pg (ref 26.0–34.0)
MCH: 32.8 pg (ref 26.0–34.0)
MCHC: 35.3 g/dL (ref 30.0–36.0)
MCHC: 34.2 g/dL (ref 30.0–36.0)
MCV: 94.4 fL (ref 78.0–100.0)
MCV: 95.9 fL (ref 78.0–100.0)
PLATELETS: 39 10*3/uL — AB (ref 150–400)
Platelets: 36 10*3/uL — ABNORMAL LOW (ref 150–400)
RBC: 2.31 MIL/uL — AB (ref 4.22–5.81)
RBC: 2.41 MIL/uL — AB (ref 4.22–5.81)
RDW: 14.6 % (ref 11.5–15.5)
RDW: 15.6 % — ABNORMAL HIGH (ref 11.5–15.5)
WBC: 2.2 10*3/uL — ABNORMAL LOW (ref 4.0–10.5)
WBC: 1.2 10*3/uL — AB (ref 4.0–10.5)

## 2015-07-27 LAB — PROTIME-INR
INR: 1.15 (ref 0.00–1.49)
PROTHROMBIN TIME: 14.9 s (ref 11.6–15.2)

## 2015-07-27 LAB — TROPONIN I: Troponin I: <0.03 ng/mL (ref <0.031)

## 2015-07-27 LAB — T-HELPER CELLS (CD4) COUNT (NOT AT ARMC)
CD4 % Helper T Cell: 14 % — ABNORMAL LOW (ref 33–55)
CD4 T CELL ABS: 70 /uL — AB (ref 400–2700)

## 2015-07-27 LAB — PREPARE RBC (CROSSMATCH)

## 2015-07-27 SURGERY — ESOPHAGOGASTRODUODENOSCOPY (EGD) WITH PROPOFOL
Anesthesia: Monitor Anesthesia Care

## 2015-07-27 MED ORDER — PROPOFOL 10 MG/ML IV BOLUS
INTRAVENOUS | Status: AC
  Filled 2015-07-27: qty 20

## 2015-07-27 MED ORDER — LEVALBUTEROL HCL 0.63 MG/3ML IN NEBU
0.6300 mg | INHALATION_SOLUTION | Freq: Two times a day (BID) | RESPIRATORY_TRACT | Status: DC
  Administered 2015-07-28: 0.63 mg via RESPIRATORY_TRACT
  Filled 2015-07-27: qty 3

## 2015-07-27 MED ORDER — LIDOCAINE HCL (CARDIAC) 20 MG/ML IV SOLN
INTRAVENOUS | Status: AC
  Filled 2015-07-27: qty 5

## 2015-07-27 MED ORDER — PROPOFOL 500 MG/50ML IV EMUL
INTRAVENOUS | Status: DC | PRN
  Administered 2015-07-27: 50 mg via INTRAVENOUS
  Administered 2015-07-27: 20 mg via INTRAVENOUS

## 2015-07-27 MED ORDER — SULFAMETHOXAZOLE-TMP DS 800-160 MG PO TABS: 1.0000 | ORAL_TABLET | ORAL | Status: DC

## 2015-07-27 MED ORDER — SULFAMETHOXAZOLE-TRIMETHOPRIM 800-160 MG PO TABS
1.0000 | ORAL_TABLET | Freq: Every day | ORAL | Status: DC
  Administered 2015-07-28: 1 via ORAL
  Filled 2015-07-27: qty 1

## 2015-07-27 MED ORDER — SODIUM CHLORIDE 0.9 % IV SOLN: Freq: Once | INTRAVENOUS | Status: DC

## 2015-07-27 MED ORDER — SULFAMETHOXAZOLE-TRIMETHOPRIM 800-160 MG PO TABS: 1.0000 | ORAL_TABLET | ORAL | Status: DC

## 2015-07-27 MED ORDER — LACTATED RINGERS IV SOLN
INTRAVENOUS | Status: DC | PRN
  Administered 2015-07-27: 08:00:00 via INTRAVENOUS

## 2015-07-27 MED ORDER — PROPOFOL INFUSION 10 MG/ML OPTIME
INTRAVENOUS | Status: DC | PRN
  Administered 2015-07-27: 100 ug/kg/min via INTRAVENOUS

## 2015-07-27 SURGICAL SUPPLY — 14 items

## 2015-07-27 NOTE — Progress Notes (Signed)
Received from SDU, PRBC infusing, agree with previous RN's assessment.

## 2015-07-27 NOTE — Anesthesia Postprocedure Evaluation (Signed)
  Anesthesia Post-op Note  Patient: Steven [name]  Procedure(s) Performed: Procedure(s) (LRB): ESOPHAGOGASTRODUODENOSCOPY (EGD) WITH PROPOFOL (N/A)  Patient Location: PACU  Anesthesia Type: MAC  Level of Consciousness: awake and alert   Airway and Oxygen Therapy: Patient Spontanous Breathing  Post-op Pain: mild  Post-op Assessment: Post-op Vital signs reviewed, Patient's Cardiovascular Status Stable, Respiratory Function Stable, Patent Airway and No signs of Nausea or vomiting  Last Vitals:  Filed Vitals:   07/27/15 1230  BP: 141/98  Pulse:   Temp: 36.6 C  Resp: 18    Post-op Vital Signs: stable   Complications: No apparent anesthesia complications

## 2015-07-27 NOTE — Op Note (Signed)
[facility] [address] [place], [address]   ENDOSCOPY PROCEDURE REPORT  PATIENT: Steven [name], Steven [name]  MR#: [id number] BIRTHDATE: September 04, 1966 , 49  yrs. old GENDER: male ENDOSCOPIST: Roxy Cedar, MD REFERRED BY:  [facility] PROCEDURE DATE:  07/27/2015 PROCEDURE:  EGD, diagnostic ASA CLASS:     Class III INDICATIONS:  hematemesis and melena. MEDICATIONS: Monitored anesthesia care and Per Anesthesia TOPICAL ANESTHETIC: none  DESCRIPTION OF PROCEDURE: After the risks benefits and alternatives of the procedure were thoroughly explained, informed consent was obtained.  The Pentax Gastroscope [id number] endoscope was introduced through the mouth and advanced to the second portion of the duodenum , Without limitations.  The instrument was slowly withdrawn as the mucosa was fully examined.    EXAM:The esophagus revealed a 4 cm linear tear in the midesophagus. There was adherent clot.  No active bleeding.  Esophagus below appeared to have a ringed appearance without significant stenosis. The stomach was normal.  The duodenum was normal.  Retroflexed views revealed no abnormalities.     The scope was then withdrawn from the patient and the procedure completed.  COMPLICATIONS: There were no immediate complications.  ENDOSCOPIC IMPRESSION: 1. Linear mid esophageal tear as cause for recent GI bleed. Most likely traumatic secondary to retching or possibly transient food impaction with vomiting for relief 2. Otherwise normal EGD  RECOMMENDATIONS: 1. Full liquid diet for 48 hours then soft food diet for 1 week. Resume normal diet thereafter 2.. PPI therapy for 2 weeks. May convert to oral at time of discharge 3. Would keep in hospital overnight for observation to be certain of no recurrent bleeding. If okay in a.m., could be discharged. Please call for questions or problems. Will sign off. Findings discussed with patient  REPEAT EXAM:  eSigned:  Roxy Cedar, MD 07/27/2015 9:17 AM    CC:

## 2015-07-27 NOTE — Interval H&P Note (Signed)
History and Physical Interval Note:  07/27/2015 9:04 AM  Steven [name]  has presented today for surgery, with the diagnosis of hematemesis, melena  The various methods of treatment have been discussed with the patient and family. After consideration of risks, benefits and other options for treatment, the patient has consented to  Procedure(s): ESOPHAGOGASTRODUODENOSCOPY (EGD) WITH PROPOFOL (N/A) as a surgical intervention .  The patient's history has been reviewed, patient examined, no change in status, stable for surgery.  I have reviewed the patient's chart and labs.  Questions were answered to the patient's satisfaction.     [name]

## 2015-07-27 NOTE — Telephone Encounter (Signed)
Call received from [facility], CM requesting that the patient be evaluated for the Transitional Care Clinic. This CM to meet with the patient tomorrow.

## 2015-07-27 NOTE — Transfer of Care (Signed)
Immediate Anesthesia Transfer of Care Note  Patient: Steven [name]  Procedure(s) Performed: Procedure(s): ESOPHAGOGASTRODUODENOSCOPY (EGD) WITH PROPOFOL (N/A)  Patient Location: PACU  Anesthesia Type:MAC  Level of Consciousness: awake, alert  and oriented  Airway & Oxygen Therapy: Patient Spontanous Breathing and Patient connected to nasal cannula oxygen  Post-op Assessment: Report given to RN and Post -op Vital signs reviewed and stable  Post vital signs: Reviewed and stable  Last Vitals:  Filed Vitals:   07/27/15 0744  BP: 131/107  Pulse: 59  Temp: 36.3 C  Resp: 13    Complications: No apparent anesthesia complications

## 2015-07-27 NOTE — Clinical Documentation Improvement (Signed)
Internal Medicine  Can the diagnosis of anemia be further specified?   Iron deficiency Anemia  Nutritional anemia, including the nutrition or mineral deficits  Chronic Anemia, including the suspected or known cause  Anemia of chronic disease, including the associated chronic disease state  Acute blood loss anemia  Other  Clinically Undetermined  Document any associated diagnoses/conditions.   Supporting Information: *Per ED Provider Note: "Anemia requiring transfusions" *Hemoglobin values since arrival have been: 7.5 (8/22), 7.7 (8/23) *Patient received PRBCs on 8/22   Please exercise your independent, professional judgment when responding. A specific answer is not anticipated or expected.   Thank You,   Alesia Richards, RN CDS [facility] Health Information Management [email or web address] [phone]
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
## 2015-07-27 NOTE — Anesthesia Preprocedure Evaluation (Addendum)
Anesthesia Evaluation  Patient identified by MRN, date of birth, ID band Patient awake    Reviewed: Allergy & Precautions, NPO status , Patient's Chart, lab work & pertinent test results  Airway Mallampati: II  TM Distance: >3 FB Neck ROM: Full    Dental no notable dental hx.    Pulmonary asthma , Current Smoker,  breath sounds clear to auscultation  Pulmonary exam normal       Cardiovascular negative cardio ROS Normal cardiovascular examRhythm:Regular Rate:Normal     Neuro/Psych Subdural hematoma 2011 negative neurological ROS  negative psych ROS   GI/Hepatic negative GI ROS, (+)     substance abuse  alcohol use, Hepatitis -, C  Endo/Other  negative endocrine ROS  Renal/GU negative Renal ROS  negative genitourinary   Musculoskeletal negative musculoskeletal ROS (+)   Abdominal   Peds negative pediatric ROS (+)  Hematology negative hematology ROS (+) anemia , HIV, PLT 39k Hgb 7.7   Anesthesia Other Findings   Reproductive/Obstetrics negative OB ROS                           Anesthesia Physical Anesthesia Plan  ASA: IV  Anesthesia Plan: MAC   Post-op Pain Management:    Induction:   Airway Management Planned: Natural Airway  Additional Equipment:   Intra-op Plan:   Post-operative Plan:   Informed Consent: I have reviewed the patients History and Physical, chart, labs and discussed the procedure including the risks, benefits and alternatives for the proposed anesthesia with the patient or authorized representative who has indicated his/her understanding and acceptance.   Dental advisory given  Plan Discussed with: CRNA  Anesthesia Plan Comments:         Anesthesia Quick Evaluation

## 2015-07-27 NOTE — Care Management Note (Signed)
Case Management Note  Patient Details  Name: Brook Mall MRN: [id number] Date of Birth: Jan 01, 1966  Subjective/Objective: 49 y/o m transfer from SDU.GIB. From home.  No insurance.No pcp.Spoke to patient about d/c plans. He is in agreement to [facility] for pcp, [facility]-assessment-left vm w/liason.Artist following.Provided w/community resources,heath insurance info,pcp listing.                Action/Plan:d/c plan home.   Expected Discharge Date:   (unknown)               Expected Discharge Plan:  Home/Self Care  In-House Referral:     Discharge planning Services  CM Consult, Indigent Health Clinic, Medication Assistance  Post Acute Care Choice:    Choice offered to:     DME Arranged:    DME Agency:     HH Arranged:    HH Agency:     Status of Service:  In process, will continue to follow  Medicare Important Message Given:    Date Medicare IM Given:    Medicare IM give by:    Date Additional Medicare IM Given:    Additional Medicare Important Message give by:     If discussed at Long Length of Stay Meetings, dates discussed:    Additional Comments:  Lanier Clam, RN 07/27/2015, 12:04 PM

## 2015-07-27 NOTE — Progress Notes (Signed)
[facility] PROGRESS NOTE  Steven [name] WUJ:[id number] DOB: 02-23-1966 DOA: 07/26/2015 PCP: No primary care provider on file.  Assessment/Plan: Active Problems:   GI bleed   Esophageal tear   Gastrointestinal hemorrhage with melena    #1 GI bleed-Stools heme-positive. Hemoglobin 7.5, down from 12.2 06/15/2015. BUN 40, up from 8 on 06/14/2015, suggesting upper GI source of blood loss. Patient to be admitted for IV fluids, transfuse as needed, PPI drip and octreotide. Patient is status post EGD, linear mid esophageal tear. Full liquid diet for 48 hours and soft diet for 1 week is recommended. PPI therapy for 2 weeks. Transfer patient to telemetry. Hemoglobin is still low after 2 units of packed red blood cells. Patient is receiving his third unit of packed red blood cells. Repeat CBC this afternoon.  #2 HIV-patient is on salvage regimen Truvada, Norvir, Prezista, states that he is compliant with his therapy. Last seen in infectious disease clinic on 5/17. In the process of transferring care to [facility]. CD4 T cell count is 14. HIV RNA quantitative pending. Patient would need follow-up with infectious disease clinic will set up this appointment prior to discharge.  #3 Chronic back pain  #4 Chronic hepatitis C without hepatic coma (HCC) Not a treatment candidate due to alcohol abuse. Advised on cessation and likely consider to be candidate in the future if he stops completely.  -HCV viral load-2,854,573(03/13/15) -PT/PTT-10.9/28.8  #5 Anxiety -mood stable today  6. Anal dysplasia to be followed up by his infectious disease provider    Code Status:      Code Status Orders        Start     Ordered   07/26/15 1517  Full code   Continuous     07/26/15 1519     Family Communication: family updated about patient's clinical progress Disposition Plan:  As above    Brief narrative: 49 y.o. male with a past medical history of HIV dating back to 1985, chronic  hepatitis C, GERD, subarachnoid hemorrhage in 2011 after head trauma, perianal condyloma acuminata in 2011, latent syphilis diagnosed in 2011, chronic low back pain, esophageal candidiasis, recurrent, hearing loss, temporal bone fracture in 2011, anal dysplasia diagnosed in 2012, insomnia, alcohol abuse, herpes simplex type II infection diagnosed in 2011, asthma, depression, anxiety, and tobacco abuse. 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 He presented to the emergency room by EMS today after vomiting dark tarry vomitus. This morning he developed epigastric discomfort and vomited dark black liquid. He had another episode in the ambulance and then again in the emergency room. He also had a black tarry bowel movement today and was found to have heme-positive stools in the emergency room. He states that he woke up feeling very weak and washed out and after he vomited felt weak and lightheaded and called EMS.  He reports that he has been compliant with his regimen of Truvada, Norvir and Prezista. He has not been a candidate for treatment of his chronic hepatitis C due to continued alcohol intake. He he is supposed to transfer his care from [facility] to [facility] infectious disease. He does not have a PCP and frequently visits ER for refills on pain medications.He continues to smoke a half pack a day. HCV viral load 03/13/2015 was 9,562,130.   Consultants:  Gastroenterology  Procedures: EGD-Linear mid esophageal tear as cause for recent GI bleed. Most likely traumatic secondary to  retching or possibly transient food impaction with vomiting for relief  2. Otherwise normal EGD  Antibiotics: Anti-infectives    Start     Dose/Rate Route Frequency Ordered Stop   07/27/15 1000  emtricitabine-tenofovir (TRUVADA) 200-300 MG per tablet 1 tablet     1 tablet Oral  Every morning - 10a 07/26/15 2111     07/27/15 1000  sulfamethoxazole-trimethoprim (BACTRIM DS) 800-160 MG  per tablet 1 tablet     1 tablet Oral Daily 07/26/15 2111     07/27/15 0800  Darunavir Ethanolate (PREZISTA) tablet 800 mg     800 mg Oral Daily with breakfast 07/26/15 2111     07/27/15 0800  ritonavir (NORVIR) capsule 100 mg     100 mg Oral Daily with breakfast 07/26/15 2111           HPI/Subjective: No hematemesis overnight  Objective: Filed Vitals:   07/27/15 0904 07/27/15 0905 07/27/15 0910 07/27/15 0922  BP: 118/84 118/84 118/81 140/91  Pulse: 54 55 64 45  Temp:      TempSrc:      Resp: 15 0 15 20  Height:      Weight:      SpO2: 98% 91% 90% 100%    Intake/Output Summary (Last 24 hours) at 07/27/15 1009 Last data filed at 07/27/15 1610  Gross per 24 hour  Intake 2031.25 ml  Output   1625 ml  Net 406.25 ml    Exam:  General: No acute respiratory distress Lungs: Clear to auscultation bilaterally without wheezes or crackles Cardiovascular: Regular rate and rhythm without murmur gallop or rub normal S1 and S2 Abdomen: Nontender, nondistended, soft, bowel sounds positive, no rebound, no ascites, no appreciable mass Extremities: No significant cyanosis, clubbing, or edema bilateral lower extremities     Data Review   Micro Results Recent Results (from the past 240 hour(s))  MRSA PCR Screening     Status: None   Collection Time: 07/26/15 12:46 PM  Result Value Ref Range Status   MRSA by PCR NEGATIVE NEGATIVE Final    Comment:        The GeneXpert MRSA Assay (FDA approved for NASAL specimens only), is one component of a comprehensive MRSA colonization surveillance program. It is not intended to diagnose MRSA infection nor to guide or monitor treatment for MRSA infections.     Radiology Reports Dg Abd Acute W/chest  07/26/2015   CLINICAL DATA:  GI bleed, vomiting, history HIV/AIDS, hepatitis-C, asthma, smoker  EXAM: DG ABDOMEN ACUTE W/ 1V CHEST  COMPARISON:  Chest radiograph 06/15/2015  FINDINGS:  Normal heart size, mediastinal contours and pulmonary  vascularity.  Atherosclerotic calcification aorta.  Lungs appear hyperaerated with minimal central peribronchial thickening.  No infiltrate, pleural effusion or pneumothorax.  Nonobstructive bowel gas pattern.  No bowel dilatation, bowel wall thickening or free air.  Small pelvic phleboliths.  Levoconvex lumbar scoliosis without acute osseous abnormalities.  No urinary tract calcification.  IMPRESSION: Hyperaeration and minimal peribronchial thickening question bronchitis.  Normal bowel gas pattern.   Electronically Signed   By: Ulyses Southward M.D.   On: 07/26/2015 14:28     CBC  Recent Labs Lab 07/26/15 1348 07/27/15 0343  WBC 1.9* 2.2*  HGB 7.5* 7.7*  HCT 21.6* 21.8*  PLT 50* 39*  MCV 98.2 94.4  MCH 34.1* 33.3  MCHC 34.7 35.3  RDW 12.9 14.6  LYMPHSABS 0.4*  --   MONOABS 0.5  --   EOSABS 0.0  --   BASOSABS 0.0  --  Chemistries   Recent Labs Lab 07/26/15 1348 07/26/15 1553 07/27/15 0343  NA 136  --  135  K 4.4  --  4.6  CL 102  --  105  CO2 30  --  26  GLUCOSE 103*  --  116*  BUN 40*  --  36*  CREATININE 0.95  --  1.04  CALCIUM 8.8*  --  7.6*  MG  --  2.1  --   AST 178*  --  142*  ALT 110*  --  96*  ALKPHOS 75  --  54  BILITOT 1.1  --  1.8*   ------------------------------------------------------------------------------------------------------------------ estimated creatinine clearance is 86.3 mL/min (by C-G formula based on Cr of 1.04). ------------------------------------------------------------------------------------------------------------------ No results for input(s): HGBA1C in the last 72 hours. ------------------------------------------------------------------------------------------------------------------ No results for input(s): CHOL, HDL, LDLCALC, TRIG, CHOLHDL, LDLDIRECT in the last 72 hours. ------------------------------------------------------------------------------------------------------------------  Recent Labs  07/26/15 1554  TSH 1.022    ------------------------------------------------------------------------------------------------------------------ No results for input(s): VITAMINB12, FOLATE, FERRITIN, TIBC, IRON, RETICCTPCT in the last 72 hours.  Coagulation profile  Recent Labs Lab 07/26/15 1348 07/27/15 0343  INR 1.18 1.15    No results for input(s): DDIMER in the last 72 hours.  Cardiac Enzymes  Recent Labs Lab 07/26/15 1553 07/26/15 2200 07/27/15 0343  TROPONINI 0.03 <0.03 <0.03   ------------------------------------------------------------------------------------------------------------------ Invalid input(s): POCBNP   CBG: No results for input(s): GLUCAP in the last 168 hours.     Studies: Dg Abd Acute W/chest  07/26/2015   CLINICAL DATA:  GI bleed, vomiting, history HIV/AIDS, hepatitis-C, asthma, smoker  EXAM: DG ABDOMEN ACUTE W/ 1V CHEST  COMPARISON:  Chest radiograph 06/15/2015  FINDINGS:  Normal heart size, mediastinal contours and pulmonary vascularity.  Atherosclerotic calcification aorta.  Lungs appear hyperaerated with minimal central peribronchial thickening.  No infiltrate, pleural effusion or pneumothorax.  Nonobstructive bowel gas pattern.  No bowel dilatation, bowel wall thickening or free air.  Small pelvic phleboliths.  Levoconvex lumbar scoliosis without acute osseous abnormalities.  No urinary tract calcification.  IMPRESSION: Hyperaeration and minimal peribronchial thickening question bronchitis.  Normal bowel gas pattern.   Electronically Signed   By: Ulyses Southward M.D.   On: 07/26/2015 14:28      Lab Results  Component Value Date   HGBA1C 5.0 07/15/2013   Lab Results  Component Value Date   LDLCALC 44 07/15/2013   CREATININE 1.04 07/27/2015       Scheduled Meds: . sodium chloride   Intravenous Once  . Darunavir Ethanolate  800 mg Oral Q breakfast  . emtricitabine-tenofovir  1 tablet Oral q morning - 10a  . folic acid  1 mg Oral Daily  . levalbuterol  0.63 mg  Nebulization Q6H  . LORazepam  0-4 mg Intravenous 4 times per day   Followed by  . [START ON 07/28/2015] LORazepam  0-4 mg Intravenous Q12H  . multivitamin with minerals  1 tablet Oral Daily  . nicotine  14 mg Transdermal Daily  . ritonavir  100 mg Oral Q breakfast  . sodium chloride  3 mL Intravenous Q12H  . sulfamethoxazole-trimethoprim  1 tablet Oral Daily  . thiamine  100 mg Oral Daily   Or  . thiamine  100 mg Intravenous Daily   Continuous Infusions: . sodium chloride 100 mL/hr (07/27/15 0429)  . octreotide  (SANDOSTATIN)    IV infusion 50 mcg/hr (07/27/15 0400)  . pantoprozole (PROTONIX) infusion 8 mg/hr (07/27/15 0430)    Active Problems:   GI bleed   Esophageal tear  Gastrointestinal hemorrhage with melena    Time spent: 45 minutes   [facility]  [facility] Pager [phone]. If 7PM-7AM, please contact night-coverage at [email or web address], password [facility] 07/27/2015, 10:09 AM  LOS: 1 day

## 2015-07-28 ENCOUNTER — Encounter (HOSPITAL_COMMUNITY): Payer: Self-pay | Admitting: Internal Medicine

## 2015-07-28 DIAGNOSIS — D61818 Other pancytopenia: Secondary | ICD-10-CM | POA: Diagnosis present

## 2015-07-28 DIAGNOSIS — S1121XD Laceration without foreign body of pharynx and cervical esophagus, subsequent encounter: Secondary | ICD-10-CM

## 2015-07-28 DIAGNOSIS — B20 Human immunodeficiency virus [HIV] disease: Secondary | ICD-10-CM | POA: Diagnosis present

## 2015-07-28 LAB — COMPREHENSIVE METABOLIC PANEL
ALBUMIN: 2.6 g/dL — AB (ref 3.5–5.0)
ALK PHOS: 44 U/L (ref 38–126)
ALT: 95 U/L — AB (ref 17–63)
AST: 135 U/L — AB (ref 15–41)
Anion gap: 5 (ref 5–15)
BILIRUBIN TOTAL: 1.3 mg/dL — AB (ref 0.3–1.2)
BUN: 19 mg/dL (ref 6–20)
CALCIUM: 7.3 mg/dL — AB (ref 8.9–10.3)
CO2: 24 mmol/L (ref 22–32)
Chloride: 100 mmol/L — ABNORMAL LOW (ref 101–111)
Creatinine, Ser: 1 mg/dL (ref 0.61–1.24)
GFR calc Af Amer: >60 mL/min (ref >60)
GFR calc non Af Amer: >60 mL/min (ref >60)
GLUCOSE: 119 mg/dL — AB (ref 65–99)
Potassium: 3.9 mmol/L (ref 3.5–5.1)
Sodium: 129 mmol/L — ABNORMAL LOW (ref 135–145)
TOTAL PROTEIN: 5.3 g/dL — AB (ref 6.5–8.1)

## 2015-07-28 LAB — CBC
HEMATOCRIT: 21 % — AB (ref 39.0–52.0)
HEMOGLOBIN: 7.3 g/dL — AB (ref 13.0–17.0)
MCH: 33.3 pg (ref 26.0–34.0)
MCHC: 34.8 g/dL (ref 30.0–36.0)
MCV: 95.9 fL (ref 78.0–100.0)
Platelets: 31 10*3/uL — ABNORMAL LOW (ref 150–400)
RBC: 2.19 MIL/uL — AB (ref 4.22–5.81)
RDW: 15.7 % — ABNORMAL HIGH (ref 11.5–15.5)
WBC: 0.7 10*3/uL — AB (ref 4.0–10.5)

## 2015-07-28 LAB — PREPARE RBC (CROSSMATCH)

## 2015-07-28 LAB — HIV-1 RNA QUANT-NO REFLEX-BLD
HIV 1 RNA Quant: <20 copies/mL
LOG10 HIV-1 RNA: UNDETERMINED log10copy/mL

## 2015-07-28 MED ORDER — SERTRALINE HCL 50 MG PO TABS: 50.0000 mg | ORAL_TABLET | Freq: Every day | ORAL | Status: DC

## 2015-07-28 MED ORDER — PANTOPRAZOLE SODIUM 40 MG PO TBEC: 40.0000 mg | DELAYED_RELEASE_TABLET | Freq: Two times a day (BID) | ORAL | Status: DC

## 2015-07-28 MED ORDER — BENZONATATE 100 MG PO CAPS: 100.0000 mg | ORAL_CAPSULE | Freq: Three times a day (TID) | ORAL | Status: DC

## 2015-07-28 MED ORDER — CLONAZEPAM 1 MG PO TABS: 1.0000 mg | ORAL_TABLET | Freq: Two times a day (BID) | ORAL | Status: DC | PRN

## 2015-07-28 MED ORDER — SODIUM CHLORIDE 0.9 % IV SOLN
Freq: Once | INTRAVENOUS | Status: AC
  Administered 2015-07-28: 12:00:00 via INTRAVENOUS

## 2015-07-28 MED ORDER — OXYCODONE HCL 5 MG PO TABS: 5.0000 mg | ORAL_TABLET | Freq: Four times a day (QID) | ORAL | Status: DC | PRN

## 2015-07-28 MED ORDER — DRONABINOL 2.5 MG PO CAPS: 2.5000 mg | ORAL_CAPSULE | Freq: Two times a day (BID) | ORAL | Status: DC

## 2015-07-28 NOTE — Hospital Discharge Follow-Up (Signed)
Met with the patient at the request of Kathy Mahabir, CM.  He does not qualify for the Transitional Care Program at this time; but he is agreeable to following up at the [facility] for post hospital care.  He said that he was aware of his appointment on 08/02/15 @ 1600 and said that he also has an appointment with Dr Van Dam the same day.  He noted that he has transportation to his appointment, stating that his Pastor lives across the street and can take him if needed. The [facility] visit information was placed on the AVS.   Explained the services provided at the [facility], including pharmacy assistance, financial counseling and social work  He reported that he has not problems getting his medications and his pharmacy is [facility] on [address].  He said that he has all of the medications that he needs.   Regarding his insurance status, he said that he has all of the information for application for the orange card and he is aware of the walk in hours for financial assistance at [facility].  He also reported that he has been waiting for disability for 7 years and will hopefully be eligible next month.  No other problems/concerns reported.      

## 2015-07-28 NOTE — Discharge Summary (Signed)
Physician Discharge Summary  Steven [name] AVW:[id number] DOB: 08/04/1966 DOA: 07/26/2015  PCP: No primary care provider on file.  Admit date: 07/26/2015 Discharge date: 07/28/2015  Time spent: > 35 minutes  Recommendations for Outpatient Follow-up:  1. Follow up with Dr. Daiva Eves in 5 days as scheduled 2. Follow up with Dr. Venetia Night in 5 days as scheduled 3. Repeat CBC in 5 days when seen  Discharge Diagnoses:  Active Problems:   GI bleed   Esophageal tear   Gastrointestinal hemorrhage with melena   HIV disease   Other pancytopenia  Discharge Condition: stable  Diet recommendation: regular  Filed Weights   07/27/15 0541 07/27/15 0744 07/27/15 1106  Weight: 77.6 kg (171 lb 1.2 oz) 72.576 kg (160 lb) 72.576 kg (160 lb)   History of present illness:  49 y.o. male with a past medical history of HIV dating back to 1985, chronic hepatitis C, GERD, subarachnoid hemorrhage in 2011 after head trauma, perianal condyloma acuminata in 2011, latent syphilis diagnosed in 2011, chronic low back pain, esophageal candidiasis, recurrent, hearing loss, temporal bone fracture in 2011, anal dysplasia diagnosed in 2012, insomnia, alcohol abuse, herpes simplex type II infection diagnosed in 2011, asthma, depression, anxiety, and tobacco abuse. 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 He presented to the emergency room by EMS today after vomiting dark tarry vomitus. This morning he developed epigastric discomfort and vomited dark black liquid. He had another episode in the ambulance and then again in the emergency room. He also had a black tarry bowel movement today and was found to have heme-positive stools in the emergency room. He states that he woke up feeling very weak and washed out and after he vomited felt weak and lightheaded and called EMS. He reports that he has been compliant with his regimen of Truvada, Norvir and Prezista. He has not been a candidate for  treatment of his chronic hepatitis C due to continued alcohol intake. He he is supposed to transfer his care from [facility] to [facility] infectious disease. He does not have a PCP and frequently visits ER for refills on pain medications. He continues to smoke a half pack a day. HCV viral load 03/13/2015 was 8,295,621.  Hospital Course:   GI bleed - patient was admitted with dark tarry vomiting. GI was consulted and patient underwent an EGD on 8/23 which showed a linear mid esophageal tear as the cause for the bleed. He was transfused a total of 4U pRBC and clinically his bleeding resolved, had no further nausea or vomiting and was able to tolerate a diet. Patient very insistent to go home on 8/24, has received his last transfusion and could not wait for repeat H&H as he has a small dog at home. He has a degree of anemia and pancytopenia also likely from his underlying HIV as well as liver disease in the setting of HCV infection and ongoing ETOH abuse, also with a dilutional component here in the hospital. He was placed on PPI therapy and was recommended to be on soft foods for a week as per GI.  HIV - patient is on salvage regimen Truvada, Norvir, Prezista, states that he is compliant with his therapy. Last seen in infectious disease clinic on 5/17. He is in the process of transferring care to [facility]. CD4 T cell count is 70. HIV RNA quantitative pending. Patient was set up with ID in 5 days after discharge.  Chronic  back pain - outpatient management Chronic hepatitis C without hepatic coma (HCC) - Not a treatment candidate due to alcohol abuse. Advised on cessation and likely consider to be candidate in the future if he stops completely.  -HCV viral load-2,854,573(03/13/15) -PT/PTT-10.9/28.8 Anal dysplasia - to be followed up by his infectious disease provider  Alcohol abuse - strongly advised to quit   Procedures:  EGD 8/23 ENDOSCOPIC IMPRESSION: 1. Linear mid esophageal tear as cause for  recent GI bleed. Most likely traumatic secondary to retching or possibly transient food impaction with vomiting for relief 2. Otherwise normal EGD  RECOMMENDATIONS:1. Full liquid diet for 48 hours then soft food diet for 1 week. Resume normal diet thereafter 2.. PPI therapy for 2 weeks. May convert to oral at time of discharge 3. Would keep in hospital overnight for observation to be certain of no recurrent bleeding. If okay in a.m., could be discharged. Please call for questions or problems. Will sign off. Findings discussed with patient   Consultations:  Gastroenterology   Discharge Exam: Filed Vitals:   07/28/15 0857 07/28/15 1125 07/28/15 1145 07/28/15 1400  BP:  144/97 138/86 131/87  Pulse: 55 53 51 54  Temp:  97.8 F (36.6 C) 98.1 F (36.7 C) 97.8 F (36.6 C)  TempSrc:  Oral Oral Oral  Resp: Height:      Weight:      SpO2: 100% 100% 99% 100%    General: NAD Cardiovascular: RRR Respiratory: CTA biL  Discharge Instructions     Medication List    STOP taking these medications        chlordiazePOXIDE 10 MG capsule  Commonly known as:  LIBRIUM     HYDROcodone-acetaminophen 5-325 MG per tablet  Commonly known as:  NORCO/VICODIN      TAKE these medications        albuterol 108 (90 BASE) MCG/ACT inhaler  Commonly known as:  PROVENTIL HFA;VENTOLIN HFA  Inhale 2 puffs into the lungs every 6 (six) hours as needed for wheezing or shortness of breath (cough).     benzonatate 100 MG capsule  Commonly known as:  TESSALON  Take 1 capsule (100 mg total) by mouth every 8 (eight) hours.     clonazePAM 1 MG tablet  Commonly known as:  KLONOPIN  Take 1 tablet (1 mg total) by mouth 2 (two) times daily as needed for anxiety.     Darunavir Ethanolate 800 MG tablet  Commonly known as:  PREZISTA  Take 1 tablet (800 mg total) by mouth daily with breakfast.     diphenhydrAMINE 25 mg capsule  Commonly known as:  BENADRYL  Take 25 mg by mouth every 6 (six)  hours as needed for allergies.     dronabinol 2.5 MG capsule  Commonly known as:  MARINOL  Take 1 capsule (2.5 mg total) by mouth 2 (two) times daily before a meal.     emtricitabine-tenofovir 200-300 MG per tablet  Commonly known as:  TRUVADA  Take 1 tablet by mouth every morning.     fexofenadine 180 MG tablet  Commonly known as:  ALLEGRA  Take 180 mg by mouth daily.     hydroxypropyl methylcellulose / hypromellose 2.5 % ophthalmic solution  Commonly known as:  ISOPTO TEARS / GONIOVISC  Place 1 drop into both eyes 3 (three) times daily as needed for dry eyes.     multivitamin with minerals Tabs tablet  Take 1 tablet by mouth daily.     oxyCODONE  5 MG immediate release tablet  Commonly known as:  ROXICODONE  Take 1 tablet (5 mg total) by mouth every 6 (six) hours as needed for severe pain.     pantoprazole 40 MG tablet  Commonly known as:  PROTONIX  Take 1 tablet (40 mg total) by mouth 2 (two) times daily.     ritonavir 100 MG capsule  Commonly known as:  NORVIR  Take 1 capsule (100 mg total) by mouth every morning.     sertraline 50 MG tablet  Commonly known as:  ZOLOFT  Take 1 tablet (50 mg total) by mouth daily.     sulfamethoxazole-trimethoprim 800-160 MG per tablet  Commonly known as:  BACTRIM DS  Take 1 tablet by mouth daily.           Follow-up Information    Follow up with Acey Lav, MD. Go on 08/02/2015.   Specialty:  Infectious Diseases   Why:  You have an appt already scheduled for 08/02/15 at 0900 with Dr Daiva Eves    Contact information:   [address] [place] [address] [phone]       Follow up with [facility]. Go on 08/02/2015.   Specialty:  Internal Medicine   Why:  at 4:00pm with Dr Carleene Overlie information:   [id number] [place] [address] [phone]      The results of significant diagnostics from this hospitalization  (including imaging, microbiology, ancillary and laboratory) are listed below for reference.    Significant Diagnostic Studies: Dg Abd Acute W/chest  07/26/2015   CLINICAL DATA:  GI bleed, vomiting, history HIV/AIDS, hepatitis-C, asthma, smoker  EXAM: DG ABDOMEN ACUTE W/ 1V CHEST  COMPARISON:  Chest radiograph 06/15/2015  FINDINGS:  Normal heart size, mediastinal contours and pulmonary vascularity.  Atherosclerotic calcification aorta.  Lungs appear hyperaerated with minimal central peribronchial thickening.  No infiltrate, pleural effusion or pneumothorax.  Nonobstructive bowel gas pattern.  No bowel dilatation, bowel wall thickening or free air.  Small pelvic phleboliths.  Levoconvex lumbar scoliosis without acute osseous abnormalities.  No urinary tract calcification.  IMPRESSION: Hyperaeration and minimal peribronchial thickening question bronchitis.  Normal bowel gas pattern.   Electronically Signed   By: Ulyses Southward M.D.   On: 07/26/2015 14:28    Microbiology: Recent Results (from the past 240 hour(s))  MRSA PCR Screening     Status: None   Collection Time: 07/26/15 12:46 PM  Result Value Ref Range Status   MRSA by PCR NEGATIVE NEGATIVE Final    Comment:        The GeneXpert MRSA Assay (FDA approved for NASAL specimens only), is one component of a comprehensive MRSA colonization surveillance program. It is not intended to diagnose MRSA infection nor to guide or monitor treatment for MRSA infections.      Labs: Basic Metabolic Panel:  Recent Labs Lab 07/26/15 1348 07/26/15 1553 07/27/15 0343 07/28/15 0551  NA 136  --  135 129*  K 4.4  --  4.6 3.9  CL 102  --  105 100*  CO2 30  --  26 24  GLUCOSE 103*  --  116* 119*  BUN 40*  --  36* 19  CREATININE 0.95  --  1.04 1.00  CALCIUM 8.8*  --  7.6* 7.3*  MG  --  2.1  --   --    Liver Function Tests:  Recent Labs Lab 07/26/15 1348  07/27/15 0343 07/28/15 0551  AST 178* 142* 135*  ALT 110* 96* 95*  ALKPHOS 75 54 44    BILITOT 1.1 1.8* 1.3*  PROT 6.0* 5.6* 5.3*  ALBUMIN 2.9* 2.8* 2.6*   CBC:  Recent Labs Lab 07/26/15 1348 07/27/15 0343 07/27/15 1818 07/28/15 0551  WBC 1.9* 2.2* 1.2* 0.7*  NEUTROABS 1.0*  --   --   --   HGB 7.5* 7.7* 7.9* 7.3*  HCT 21.6* 21.8* 23.1* 21.0*  MCV 98.2 94.4 95.9 95.9  PLT 50* 39* 36* 31*   Cardiac Enzymes:  Recent Labs Lab 07/26/15 1553 07/26/15 2200 07/27/15 0343  TROPONINI 0.03 <0.03 <0.03    Signed:  Pamella Pert  [facility] 07/28/2015, 2:56 PM

## 2015-07-29 LAB — TYPE AND SCREEN
ABO/RH(D): O POS
Antibody Screen: NEGATIVE
UNIT DIVISION: 0
Unit division: 0
Unit division: 0
Unit division: 0

## 2015-08-02 ENCOUNTER — Ambulatory Visit: Payer: Self-pay | Admitting: Infectious Disease

## 2015-08-02 ENCOUNTER — Encounter: Payer: Self-pay | Admitting: Family Medicine

## 2015-08-02 ENCOUNTER — Ambulatory Visit: Payer: Self-pay | Attending: Family Medicine | Admitting: Family Medicine

## 2015-08-02 VITALS — BP 124/74 | HR 67 | Temp 98.1°F | Ht 70.0 in | Wt 176.0 lb

## 2015-08-02 DIAGNOSIS — B182 Chronic viral hepatitis C: Secondary | ICD-10-CM

## 2015-08-02 DIAGNOSIS — B20 Human immunodeficiency virus [HIV] disease: Secondary | ICD-10-CM

## 2015-08-02 DIAGNOSIS — F32A Depression, unspecified: Secondary | ICD-10-CM

## 2015-08-02 DIAGNOSIS — B1920 Unspecified viral hepatitis C without hepatic coma: Secondary | ICD-10-CM | POA: Insufficient documentation

## 2015-08-02 DIAGNOSIS — K921 Melena: Secondary | ICD-10-CM

## 2015-08-02 DIAGNOSIS — G894 Chronic pain syndrome: Secondary | ICD-10-CM

## 2015-08-02 DIAGNOSIS — F17210 Nicotine dependence, cigarettes, uncomplicated: Secondary | ICD-10-CM | POA: Insufficient documentation

## 2015-08-02 DIAGNOSIS — J45909 Unspecified asthma, uncomplicated: Secondary | ICD-10-CM | POA: Insufficient documentation

## 2015-08-02 DIAGNOSIS — Z79899 Other long term (current) drug therapy: Secondary | ICD-10-CM | POA: Insufficient documentation

## 2015-08-02 DIAGNOSIS — F329 Major depressive disorder, single episode, unspecified: Secondary | ICD-10-CM

## 2015-08-02 MED ORDER — ALBUTEROL SULFATE 108 (90 BASE) MCG/ACT IN AEPB: 2.0000 | INHALATION_SPRAY | Freq: Four times a day (QID) | RESPIRATORY_TRACT | Status: DC | PRN

## 2015-08-02 MED ORDER — SERTRALINE HCL 100 MG PO TABS: 100.0000 mg | ORAL_TABLET | Freq: Every day | ORAL | Status: DC

## 2015-08-02 NOTE — Progress Notes (Signed)
Patient here to establish care He is asking for referral to pain mgmt for refill on his oxycodone He states his pain stems from his HIV  He is also asking if we have any samples of albuterol inhalers because he cannot afford to get one

## 2015-08-02 NOTE — Patient Instructions (Signed)

## 2015-08-03 ENCOUNTER — Emergency Department (INDEPENDENT_AMBULATORY_CARE_PROVIDER_SITE_OTHER)
Admission: EM | Admit: 2015-08-03 | Discharge: 2015-08-03 | Disposition: A | Discharge status: Home / Self Care | Payer: Self-pay | Source: Home / Self Care | Attending: Emergency Medicine | Admitting: Emergency Medicine

## 2015-08-03 ENCOUNTER — Encounter (HOSPITAL_COMMUNITY): Payer: Self-pay | Admitting: Emergency Medicine

## 2015-08-03 DIAGNOSIS — Z760 Encounter for issue of repeat prescription: Secondary | ICD-10-CM

## 2015-08-03 LAB — PATHOLOGIST SMEAR REVIEW

## 2015-08-03 LAB — CBC WITH DIFFERENTIAL/PLATELET
Basophils Absolute: 0 10*3/uL (ref 0.0–0.1)
Basophils Relative: 1 % (ref 0–1)
EOS PCT: 4 % (ref 0–5)
Eosinophils Absolute: 0.1 10*3/uL (ref 0.0–0.7)
HEMATOCRIT: 28.8 % — AB (ref 39.0–52.0)
Hemoglobin: 9.6 g/dL — ABNORMAL LOW (ref 13.0–17.0)
LYMPHS ABS: 0.7 10*3/uL (ref 0.7–4.0)
LYMPHS PCT: 33 % (ref 12–46)
MCH: 32.4 pg (ref 26.0–34.0)
MCHC: 33.3 g/dL (ref 30.0–36.0)
MCV: 97.3 fL (ref 78.0–100.0)
MONO ABS: 0.4 10*3/uL (ref 0.1–1.0)
MPV: 11.6 fL (ref 8.6–12.4)
Monocytes Relative: 18 % — ABNORMAL HIGH (ref 3–12)
Neutro Abs: 0.9 10*3/uL — ABNORMAL LOW (ref 1.7–7.7)
Neutrophils Relative %: 44 % (ref 43–77)
Platelets: 80 10*3/uL — ABNORMAL LOW (ref 150–400)
RBC: 2.96 MIL/uL — ABNORMAL LOW (ref 4.22–5.81)
RDW: 15.9 % — AB (ref 11.5–15.5)
WBC: 2.1 10*3/uL — ABNORMAL LOW (ref 4.0–10.5)

## 2015-08-03 MED ORDER — CLONAZEPAM 1 MG PO TABS: 1.0000 mg | ORAL_TABLET | Freq: Two times a day (BID) | ORAL | Status: DC | PRN

## 2015-08-03 NOTE — Discharge Instructions (Signed)
You will need to get all further prescriptions for Klonopin and other controlled substances through your primary care provider and/or the pain clinic. We will not be able to do any further refills here at the urgent care center.

## 2015-08-03 NOTE — ED Provider Notes (Signed)
HPI  SUBJECTIVE:  Steven [name] is a 49 y.o. male who presents for medication refill. He has no other complaints. He is requesting a prescription of OxyContin and one month of Klonopin.  Previous records reviewed. Patient had multiple visits to the ED earlier this month for medication refills. He was seen at the [facility] yesterday on 8/29, had Zoloft albuterol refilled, and was referred to pain clinic. Patient has follow-up at the ID clinic.   [facility] narcotic database reviewed. Patient was prescribed 28 oxycodone and 14 Klonopin on 8/24, drobaninol on 8/25.  Past medical history includes HIV/AIDS, hepatitis C, asthma, ICH, chronic pain, anxiety.  Past Medical History  Diagnosis Date  . HIV (human immunodeficiency virus infection)   . Hepatitis C   . Asthma   . AIDS   . Cerebral hemorrhage   . Hemophilia     Past Surgical History  Procedure Laterality Date  . Hernia repair    . Mandible surgery    . Laporotomy    . Esophagogastroduodenoscopy (egd) with propofol N/A 07/27/2015    Procedure: ESOPHAGOGASTRODUODENOSCOPY (EGD) WITH PROPOFOL;  Surgeon: Hilarie Fredrickson, MD;  Location: [facility];  Service: Endoscopy;  Laterality: N/A;    Family History  Problem Relation Age of Onset  . CAD Mother   . Stroke Mother     Social History  Substance Use Topics  . Smoking status: Current Every Day Smoker -- 2.00 packs/day for 38 years    Types: Cigarettes  . Smokeless tobacco: Never Used  . Alcohol Use: Yes     Comment: 6 pack per week per patient     No current facility-administered medications for this encounter.  Current outpatient prescriptions:  .  Darunavir Ethanolate (PREZISTA) 800 MG tablet, Take 1 tablet (800 mg total) by mouth daily with breakfast., Disp: 30 tablet, Rfl: 0 .  dronabinol (MARINOL) 2.5 MG capsule, Take 1 capsule (2.5 mg total) by mouth 2 (two) times daily before a meal., Disp: 60 capsule, Rfl: 0 .  emtricitabine-tenofovir  (TRUVADA) 200-300 MG per tablet, Take 1 tablet by mouth every morning., Disp: 30 tablet, Rfl: 0 .  Multiple Vitamin (MULTIVITAMIN WITH MINERALS) TABS tablet, Take 1 tablet by mouth daily., Disp: , Rfl:  .  oxyCODONE (ROXICODONE) 5 MG immediate release tablet, Take 1 tablet (5 mg total) by mouth every 6 (six) hours as needed for severe pain., Disp: 28 tablet, Rfl: 0 .  pantoprazole (PROTONIX) 40 MG tablet, Take 1 tablet (40 mg total) by mouth 2 (two) times daily., Disp: 28 tablet, Rfl: 0 .  ritonavir (NORVIR) 100 MG capsule, Take 1 capsule (100 mg total) by mouth every morning., Disp: 30 capsule, Rfl: 0 .  sertraline (ZOLOFT) 100 MG tablet, Take 1 tablet (100 mg total) by mouth daily., Disp: 30 tablet, Rfl: 1 .  Albuterol Sulfate (PROAIR RESPICLICK) 108 (90 BASE) MCG/ACT AEPB, Inhale 2 puffs into the lungs every 6 (six) hours as needed., Disp: 1 each, Rfl: 1 .  benzonatate (TESSALON) 100 MG capsule, Take 1 capsule (100 mg total) by mouth every 8 (eight) hours., Disp: 21 capsule, Rfl: 0 .  clonazePAM (KLONOPIN) 1 MG tablet, Take 1 tablet (1 mg total) by mouth 2 (two) times daily as needed for anxiety., Disp: 6 tablet, Rfl: 0 .  diphenhydrAMINE (BENADRYL) 25 mg capsule, Take 25 mg by mouth every 6 (six) hours as needed for allergies., Disp: , Rfl:  .  fexofenadine (ALLEGRA) 180 MG tablet, Take 180 mg by mouth  daily., Disp: , Rfl:  .  hydroxypropyl methylcellulose (ISOPTO TEARS) 2.5 % ophthalmic solution, Place 1 drop into both eyes 3 (three) times daily as needed for dry eyes., Disp: , Rfl:  .  sulfamethoxazole-trimethoprim (BACTRIM DS) 800-160 MG per tablet, Take 1 tablet by mouth daily. (Patient not taking: Reported on 07/26/2015), Disp: 30 tablet, Rfl: 0 .  [DISCONTINUED] temazepam (RESTORIL) 15 MG capsule, Take 1 capsule (15 mg total) by mouth at bedtime as needed for sleep., Disp: 3 capsule, Rfl: 0  Allergies  Allergen Reactions  . Aspirin Other (See Comments)    "Free bleeder" affects platelets      ROS  As noted in HPI.   Physical Exam  BP 114/66 mmHg  Pulse 65  Temp(Src) 98.2 F (36.8 C) (Oral)  Resp 16  SpO2 98%  Constitutional: Well developed, well nourished, no acute distress Eyes:  EOMI, conjunctiva normal bilaterally HENT: Normocephalic, atraumatic,mucus membranes moist Respiratory: Normal inspiratory effort Cardiovascular: Normal rate GI: nondistended skin: No rash, skin intact Musculoskeletal: no deformities Neurologic: Alert & oriented x 3, no focal neuro deficits Psychiatric: Speech and behavior appropriate   ED Course   Medications - No data to display  No orders of the defined types were placed in this encounter.     ED Clinical Impression  Medication refill   ED Assessment/Plan  Discussed with patient that we are unable to fill any narcotic prescriptions and are unable to provide any long-term prescriptions for other controlled substances, such as Klonopin. Is okay with having all oxycodone done through the pain clinic. He is running out today of the Klonopin. We'll give him a three-day supply's until he can be seen by his primary care provider, [facility]. Discussed with patient that we will not be able to do anymore medication refills of any controlled substances here and emphasized that he must be seen by his primary care provider for this.  *This clinic note was created using Dragon dictation software. Therefore, there may be occasional mistakes despite careful proofreading.  ?    Domenick Gong, MD 08/03/15 1504

## 2015-08-03 NOTE — ED Notes (Signed)
Pt is here to have medications refill Has appt w/PCP next month at [facility].. No acute distress.

## 2015-08-04 ENCOUNTER — Telehealth: Payer: Self-pay | Admitting: *Deleted

## 2015-08-04 DIAGNOSIS — F32A Depression, unspecified: Secondary | ICD-10-CM | POA: Insufficient documentation

## 2015-08-04 DIAGNOSIS — F329 Major depressive disorder, single episode, unspecified: Secondary | ICD-10-CM | POA: Insufficient documentation
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
## 2015-08-04 NOTE — Telephone Encounter (Signed)
-----   Message from Enobong Amao, MD sent at 08/04/2015  8:22 AM EDT ----- Anemia and low wbc have improved compared to previous labs. 

## 2015-08-05 ENCOUNTER — Encounter: Payer: Self-pay | Admitting: *Deleted

## 2015-08-05 ENCOUNTER — Telehealth: Payer: Self-pay | Admitting: *Deleted

## 2015-08-05 NOTE — Telephone Encounter (Signed)
Placed second call trying to give patient his lab results.  RN unable to reach patient.  Letter sent to patient's home address.

## 2015-08-05 NOTE — Telephone Encounter (Signed)
-----   Message from Jaclyn Shaggy, MD sent at 08/04/2015  8:22 AM EDT ----- Anemia and low wbc have improved compared to previous labs.

## 2015-08-10 LAB — HEPATITIS B SURFACE ANTIGEN: Hepatitis B Surface Ag: NEGATIVE

## 2015-08-16 ENCOUNTER — Telehealth: Payer: Self-pay | Admitting: Family Medicine

## 2015-08-16 NOTE — Telephone Encounter (Signed)
Patient called requesting med refill on a medication, was not able to obtain name of medication, patient hung up the phone before verifying. Please f/u.

## 2015-08-19 NOTE — Telephone Encounter (Signed)
Patient calls requesting medication refill of (Bactrim). Pharmacy is [facility] on [address]. Patient has run out of medication; he was advised to call and schedule an appointment to establish care. Patient also had limited minutes on his phone, which may cut off during conversation. Please f/u with patient ASAP.

## 2015-08-25 NOTE — Telephone Encounter (Signed)
Patient came into facility stating that he has been trying to request a medication refill for 10 days and has been unable to obtain medication. Patient became upset stating that he has been trying to call and was getting hung up on.  Patient was told that all nurses were at lunch break and would need to have a seat until 2pm when they came back from lunch, patient stated "I don't need to have a seat, I know exactly what to do." Patient then proceeded to walk out of office.  Please f/u if necessary.

## 2015-09-03 NOTE — Telephone Encounter (Signed)
Patient called trying to get refills on his antiviral medications.  He was confused and thought that Dr. Venetia Night was his infectious disease MD.  I explained that we were [facility] and that he needed to call Dr. Ninetta Lights at Infectious Disease because that was who had been seeing him.  Gave patient address and phone number to infectious disease so that he could call.  Patient appreciative.

## 2015-10-11 ENCOUNTER — Telehealth: Payer: Self-pay | Admitting: Family Medicine

## 2015-10-11 NOTE — Telephone Encounter (Signed)
Patient is calling and is exponentially upset about not being able to retrieve a long list of medications. He states that he is under the impression that Dr. Venetia NightAmao is his new PCP from his last visit on 08/02/2015. Telephone messages before state that he did not have the name of his medications, however, he was able to provide me with the list that he desperately needs. The following medications are needed at the earliest convenience. If patient needs another appointment please contact patient at the earliest convenience to inform him of steps that he needs to take. The medication list is as follows:   Albuterol  Zoloft  Tessalon  Truvada  Bactrim  Marinol  Prezista   Norvir  Klonopin   Thank you for your assistance in this matter, Sadie Reynolds, ASA

## 2015-10-12 ENCOUNTER — Telehealth: Payer: Self-pay | Admitting: *Deleted

## 2015-10-12 NOTE — Telephone Encounter (Signed)
[facility].  He recenty found out they are not and would like to be seen.   [facility] with no show for appointment status.   I have scheduled him for 10-13-15 @ 1:45 with Dr Ninetta LightsHatcher.   Laurell Josephsammy K Kamber Vignola, RN

## 2015-10-12 NOTE — Telephone Encounter (Signed)
Patient called upset that he can not get his refills from [facility]. [facility]. Transferred to [employer]my King, intake nurse to reschedule his appt here. Wendall MolaJacqueline Gracin [place]

## 2015-10-13 ENCOUNTER — Ambulatory Visit (INDEPENDENT_AMBULATORY_CARE_PROVIDER_SITE_OTHER): Payer: Self-pay | Admitting: Infectious Diseases

## 2015-10-13 ENCOUNTER — Encounter: Payer: Self-pay | Admitting: Infectious Diseases

## 2015-10-13 ENCOUNTER — Encounter: Payer: Self-pay | Admitting: *Deleted

## 2015-10-13 VITALS — BP 151/96 | HR 76 | Temp 98.1°F | Wt 172.0 lb

## 2015-10-13 DIAGNOSIS — B20 Human immunodeficiency virus [HIV] disease: Secondary | ICD-10-CM

## 2015-10-13 DIAGNOSIS — B182 Chronic viral hepatitis C: Secondary | ICD-10-CM

## 2015-10-13 DIAGNOSIS — Q439 Congenital malformation of intestine, unspecified: Secondary | ICD-10-CM

## 2015-10-13 DIAGNOSIS — Z79899 Other long term (current) drug therapy: Secondary | ICD-10-CM

## 2015-10-13 DIAGNOSIS — K6282 Dysplasia of anus: Secondary | ICD-10-CM

## 2015-10-13 DIAGNOSIS — Z23 Encounter for immunization: Secondary | ICD-10-CM

## 2015-10-13 DIAGNOSIS — Z113 Encounter for screening for infections with a predominantly sexual mode of transmission: Secondary | ICD-10-CM

## 2015-10-13 LAB — CBC
HCT: 40.4 % (ref 39.0–52.0)
Hemoglobin: 13.6 g/dL (ref 13.0–17.0)
MCH: 30.4 pg (ref 26.0–34.0)
MCHC: 33.7 g/dL (ref 30.0–36.0)
MCV: 90.2 fL (ref 78.0–100.0)
MPV: 10.6 fL (ref 8.6–12.4)
Platelets: 117 K/uL — ABNORMAL LOW (ref 150–400)
RBC: 4.48 MIL/uL (ref 4.22–5.81)
RDW: 13 % (ref 11.5–15.5)
WBC: 2.8 K/uL — ABNORMAL LOW (ref 4.0–10.5)

## 2015-10-13 MED ORDER — DRONABINOL 2.5 MG PO CAPS: 2.5000 mg | ORAL_CAPSULE | Freq: Two times a day (BID) | ORAL | Status: DC

## 2015-10-13 MED ORDER — ALBUTEROL SULFATE 108 (90 BASE) MCG/ACT IN AEPB: 2.0000 | INHALATION_SPRAY | Freq: Four times a day (QID) | RESPIRATORY_TRACT | Status: DC | PRN

## 2015-10-13 MED ORDER — SULFAMETHOXAZOLE-TRIMETHOPRIM 400-80 MG PO TABS: 1.0000 | ORAL_TABLET | Freq: Two times a day (BID) | ORAL | Status: DC

## 2015-10-13 MED ORDER — BENZONATATE 100 MG PO CAPS: 100.0000 mg | ORAL_CAPSULE | Freq: Three times a day (TID) | ORAL | Status: DC

## 2015-10-13 MED ORDER — SERTRALINE HCL 100 MG PO TABS: 100.0000 mg | ORAL_TABLET | Freq: Every day | ORAL | Status: DC

## 2015-10-13 MED ORDER — EMTRICITABINE-TENOFOVIR AF 200-25 MG PO TABS: 1.0000 | ORAL_TABLET | Freq: Every day | ORAL | Status: DC

## 2015-10-13 MED ORDER — DARUNAVIR-COBICISTAT 800-150 MG PO TABS: 1.0000 | ORAL_TABLET | Freq: Every day | ORAL | Status: DC

## 2015-10-13 NOTE — Assessment & Plan Note (Signed)
Will refill his meds Check his baseline labs Offered/refused condoms.  Will give him flu shot Check quantiferon rtc in 2 months Let him know that we will not be refilling his pain rx or his klonipin.

## 2015-10-13 NOTE — Addendum Note (Signed)
Addended by: HATCHER, JEFFREY C on: 10/13/2015 02:35 PM   Modules accepted: Orders

## 2015-10-13 NOTE — Assessment & Plan Note (Signed)
Will start screening process.  Hold on elastogram for now.

## 2015-10-13 NOTE — Progress Notes (Signed)
   Subjective:    Patient ID: Steven [name], male    DOB: 06/13/1966, 49 y.o.   MRN: [id number]  HPI 49 yo M with hx of HIV+ since (previously on DTVr/TRV), [facility]. Has been positive 1985.  Had CD4 200 (03-2015). Has been off ART for > 1 month.  He had anal pap 06-2011 which showed ASCUS.  Was treated shingles. [facility]  [facility], mild esophageal tear.  He states his ADAP is up to date. [facility]  No hx of htn.  Would like colonoscopy  HIV 1 RNA QUANT (copies/mL)  Date Value  07/26/2015 <20   CD4 T CELL ABS (/uL)  Date Value  07/26/2015 70*    Review of Systems  Constitutional: Negative for appetite change and unexpected weight change.  HENT: Positive for hearing loss.   Respiratory: Positive for cough. Negative for shortness of breath.   Cardiovascular: Negative for chest pain.  Gastrointestinal: Negative for diarrhea and constipation.  Genitourinary: Negative for difficulty urinating.  Neurological: Positive for headaches.  Please see HPI. 12 point ROS o/w (-)     Objective:   Physical Exam  Constitutional: He appears well-developed and well-nourished.  HENT:  Right Ear: Tympanic membrane is retracted.  Left Ear: Tympanic membrane is retracted.  Mouth/Throat: No oropharyngeal exudate.  Eyes: EOM are normal. Pupils are equal, round, and reactive to light.  Neck: Neck supple.  Cardiovascular: Normal rate, regular rhythm and normal heart sounds.   Pulmonary/Chest: Effort normal and breath sounds normal.  Abdominal: Soft. Bowel sounds are normal. There is no tenderness. There is no rebound.  Musculoskeletal: He exhibits no edema.  Lymphadenopathy:    He has no cervical adenopathy.       Assessment & Plan:

## 2015-10-14 ENCOUNTER — Telehealth: Payer: Self-pay

## 2015-10-14 ENCOUNTER — Other Ambulatory Visit: Payer: Self-pay | Admitting: *Deleted

## 2015-10-14 DIAGNOSIS — B20 Human immunodeficiency virus [HIV] disease: Secondary | ICD-10-CM

## 2015-10-14 LAB — HEPATITIS A ANTIBODY, TOTAL: Hep A Total Ab: NONREACTIVE

## 2015-10-14 LAB — RPR TITER: RPR Titer: 1:4 {titer}

## 2015-10-14 LAB — COMPREHENSIVE METABOLIC PANEL
ALBUMIN: 4 g/dL (ref 3.6–5.1)
ALK PHOS: 75 U/L (ref 40–115)
ALT: 73 U/L — AB (ref 9–46)
AST: 91 U/L — AB (ref 10–35)
BILIRUBIN TOTAL: 0.9 mg/dL (ref 0.2–1.2)
BUN: 9 mg/dL (ref 7–25)
CALCIUM: 9.1 mg/dL (ref 8.6–10.3)
CO2: 25 mmol/L (ref 20–31)
Chloride: 97 mmol/L — ABNORMAL LOW (ref 98–110)
Creat: 0.88 mg/dL (ref 0.70–1.33)
Glucose, Bld: 65 mg/dL (ref 65–99)
Potassium: 4.5 mmol/L (ref 3.5–5.3)
Sodium: 131 mmol/L — ABNORMAL LOW (ref 135–146)
Total Protein: 8 g/dL (ref 6.1–8.1)

## 2015-10-14 LAB — T-HELPER CELL (CD4) - (RCID CLINIC ONLY)
CD4 T CELL HELPER: 11 % — AB (ref 33–55)
CD4 T Cell Abs: 80 /uL — ABNORMAL LOW (ref 400–2700)

## 2015-10-14 LAB — LIPID PANEL
CHOL/HDL RATIO: 3.5 ratio (ref <=5.0)
CHOLESTEROL: 138 mg/dL (ref 125–200)
HDL: 40 mg/dL (ref >=40)
LDL Cholesterol: 79 mg/dL (ref <130)
Triglycerides: 95 mg/dL (ref <150)
VLDL: 19 mg/dL (ref <30)

## 2015-10-14 LAB — HEPATITIS B SURFACE ANTIGEN: Hepatitis B Surface Ag: NEGATIVE

## 2015-10-14 LAB — FLUORESCENT TREPONEMAL AB(FTA)-IGG-BLD: FLUORESCENT TREPONEMAL ABS: REACTIVE — AB

## 2015-10-14 LAB — RPR: RPR Ser Ql: REACTIVE — AB

## 2015-10-14 LAB — HEPATITIS B SURFACE ANTIBODY,QUALITATIVE: Hep B S Ab: NEGATIVE

## 2015-10-14 MED ORDER — EMTRICITABINE-TENOFOVIR AF 200-25 MG PO TABS: 1.0000 | ORAL_TABLET | Freq: Every day | ORAL | Status: DC

## 2015-10-14 MED ORDER — ALBUTEROL SULFATE 108 (90 BASE) MCG/ACT IN AEPB: 2.0000 | INHALATION_SPRAY | Freq: Four times a day (QID) | RESPIRATORY_TRACT | Status: DC | PRN

## 2015-10-14 MED ORDER — DARUNAVIR-COBICISTAT 800-150 MG PO TABS: 1.0000 | ORAL_TABLET | Freq: Every day | ORAL | Status: DC

## 2015-10-14 NOTE — Telephone Encounter (Signed)
CMA called pt, pt did not answer. I left a message for patient to return my call asap.

## 2015-10-15 LAB — HIV-1 RNA ULTRAQUANT REFLEX TO GENTYP+
HIV 1 RNA Quant: 34680 copies/mL — ABNORMAL HIGH (ref <20)
HIV-1 RNA Quant, Log: 4.54 Log copies/mL — ABNORMAL HIGH (ref <1.30)

## 2015-10-15 LAB — QUANTIFERON TB GOLD ASSAY (BLOOD)
INTERFERON GAMMA RELEASE ASSAY: NEGATIVE
QUANTIFERON NIL VALUE: 0.05 [IU]/mL
Quantiferon Tb Ag Minus Nil Value: 0 IU/mL
TB AG VALUE: 0.05 [IU]/mL

## 2015-10-15 LAB — HEPATITIS C RNA QUANTITATIVE
HCV Quantitative Log: 6.25 {Log} — ABNORMAL HIGH (ref <1.18)
HCV Quantitative: 1784183 IU/mL — ABNORMAL HIGH (ref <15)

## 2015-10-19 ENCOUNTER — Telehealth: Payer: Self-pay | Admitting: *Deleted

## 2015-10-19 NOTE — Telephone Encounter (Signed)
Patient called Steven [name], asked about referrals.  Patient only needs to call [facility] to schedule [facility] appointment.  He will be able to apply [facility] there as well.  Patient will need to have an orange card for gastroenterology referral. Andree CossHowell, Michelle M, RN

## 2015-10-20 LAB — HLA B*5701: HLA-B 5701 W/RFLX HLA-B HIGH: NEGATIVE

## 2015-10-20 NOTE — Telephone Encounter (Signed)
Letter sent to patients address on file for patient to call me asap for some important information. 

## 2015-10-22 ENCOUNTER — Emergency Department (INDEPENDENT_AMBULATORY_CARE_PROVIDER_SITE_OTHER)
Admission: EM | Admit: 2015-10-22 | Discharge: 2015-10-22 | Disposition: A | Discharge status: Home / Self Care | Payer: Self-pay | Source: Home / Self Care

## 2015-10-22 ENCOUNTER — Telehealth: Payer: Self-pay | Admitting: *Deleted

## 2015-10-22 ENCOUNTER — Encounter (HOSPITAL_COMMUNITY): Payer: Self-pay | Admitting: Emergency Medicine

## 2015-10-22 DIAGNOSIS — Z760 Encounter for issue of repeat prescription: Secondary | ICD-10-CM

## 2015-10-22 LAB — HIV-1 GENOTYPR PLUS

## 2015-10-22 LAB — HEPATITIS C GENOTYPE

## 2015-10-22 MED ORDER — ALBUTEROL SULFATE HFA 108 (90 BASE) MCG/ACT IN AERS: 1.0000 | INHALATION_SPRAY | Freq: Four times a day (QID) | RESPIRATORY_TRACT | Status: DC | PRN

## 2015-10-22 MED ORDER — CLONAZEPAM 1 MG PO TABS: 0.5000 mg | ORAL_TABLET | Freq: Two times a day (BID) | ORAL | Status: DC | PRN

## 2015-10-22 MED ORDER — SULFAMETHOXAZOLE-TRIMETHOPRIM 800-160 MG PO TABS: 1.0000 | ORAL_TABLET | Freq: Two times a day (BID) | ORAL | Status: DC

## 2015-10-22 MED ORDER — SERTRALINE HCL 100 MG PO TABS: 100.0000 mg | ORAL_TABLET | Freq: Every day | ORAL | Status: DC

## 2015-10-22 NOTE — Discharge Instructions (Signed)
Medicine Refill at the Emergency Department  We have refilled your medicine today, but it is best for you to get refills through your primary health care provider's office. In the future, please plan ahead so you do not need to get refills from the emergency department.  If the medicine we refilled was a maintenance medicine, you may have received only enough to get you by until you are able to see your regular health care provider.     This information is not intended to replace advice given to you by your health care provider. Make sure you discuss any questions you have with your health care provider.     Document Released: 03/08/2004 Document Revised: 12/11/2014 Document Reviewed: 02/27/2014  Elsevier Interactive Patient Education 2016 [employer]

## 2015-10-22 NOTE — ED Notes (Signed)
Here to have meds refilled No PCP... A&O x4... No acute distress.

## 2015-10-22 NOTE — Telephone Encounter (Signed)
Patient called extremely upset that he has not received all of his patient assistance medications. He has received two of them. He said he is unable to breath without his inhaler. Advised him to go to the ED if he is having difficulty breathing. Advised him to call back on Monday to check with [name] on the PA medications. She may need to call to find out if they have shipped yet. Wendall MolaJacqueline Perle [place]

## 2015-10-26 NOTE — ED Provider Notes (Signed)
CSN: [id number]     Arrival date & time 10/22/15  1455 History   None    Chief Complaint  Patient presents with  . Medication Refill   (Consider location/radiation/quality/duration/timing/severity/associated sxs/prior Treatment) HPI History obtained from patient:  Pt has no physical complaints, but we like to have medications refilled. He [facility]. Pt states that he has lost his PCP.   Past Medical History  Diagnosis Date  . Hepatitis C     never treated  . Asthma   . AIDS (HCC) 1985   . Cerebral hemorrhage (HCC) 2000  . 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 Hernia repair    . Mandible surgery    . Laporotomy    . Esophagogastroduodenoscopy (egd) with propofol N/A 07/27/2015    Procedure: ESOPHAGOGASTRODUODENOSCOPY (EGD) WITH PROPOFOL;  Surgeon: Hilarie FredricksonJohn N Perry, MD;  Location: [facility];  Service: Endoscopy;  Laterality: N/A;   Family History  Problem Relation Age of Onset  . CAD Mother   . Stroke Mother    Social History  Substance Use Topics  . Smoking status: Current Every Day Smoker -- 2.00 packs/day for 38 years    Types: Cigarettes  . Smokeless tobacco: Never Used  . Alcohol Use: Yes     Comment: 6 pack per week per patient     Review of Systems ROS +'ve medication refill  Denies: HEADACHE, NAUSEA, ABDOMINAL PAIN, CHEST PAIN, CONGESTION, DYSURIA, SHORTNESS OF BREATH  Allergies  Aspirin  Home Medications   Prior to Admission medications   Medication Sig Start Date End Date Taking? Authorizing Provider  albuterol (PROVENTIL HFA;VENTOLIN HFA) 108 (90 BASE) MCG/ACT inhaler Inhale 1-2 puffs into the lungs every 6 (six) hours as needed for wheezing or shortness of breath. 10/22/15   Tharon AquasFrank C Jamy Cleckler, PA  clonazePAM (KLONOPIN) 1 MG tablet Take 0.5 tablets (0.5 mg total) by mouth 2 (two) times daily as needed for anxiety. 10/22/15   Tharon AquasFrank C Eduar Kumpf, PA  darunavir-cobicistat (PREZCOBIX)  800-150 MG tablet Take 1 tablet by mouth daily. Swallow whole. Do NOT crush, break or chew tablets. Take with food. 10/14/15   Judyann Munsonynthia Snider, MD  diphenhydrAMINE (BENADRYL) 25 mg capsule Take 25 mg by mouth every 6 (six) hours as needed for allergies.    Historical Provider, MD  dronabinol (MARINOL) 2.5 MG capsule Take 1 capsule (2.5 mg total) by mouth 2 (two) times daily before a meal. 10/13/15   Ginnie SmartJeffrey C Hatcher, MD  emtricitabine-tenofovir AF (DESCOVY) 200-25 MG tablet Take 1 tablet by mouth daily. 10/14/15   Judyann Munsonynthia Snider, MD  fexofenadine (ALLEGRA) 180 MG tablet Take 180 mg by mouth daily.    Historical Provider, MD  hydroxypropyl methylcellulose (ISOPTO TEARS) 2.5 % ophthalmic solution Place 1 drop into both eyes 3 (three) times daily as needed for dry eyes.    Historical Provider, MD  Multiple Vitamin (MULTIVITAMIN WITH MINERALS) TABS tablet Take 1 tablet by mouth daily.    Historical Provider, MD  oxyCODONE (ROXICODONE) 5 MG immediate release tablet Take 1 tablet (5 mg total) by mouth every 6 (six) hours as needed for severe pain. 07/28/15   Costin Otelia SergeantM Gherghe, MD  pantoprazole (PROTONIX) 40 MG tablet Take 1 tablet (40 mg total) by mouth 2 (two) times daily. 07/28/15   Costin Otelia SergeantM Gherghe, MD  sertraline (ZOLOFT) 100 MG tablet Take 1 tablet (100 mg total) by mouth daily. 10/22/15   Hortencia PilarFrank C  Luisa Hart, PA  sulfamethoxazole-trimethoprim (BACTRIM DS,SEPTRA DS) 800-160 MG tablet Take 1 tablet by mouth 2 (two) times daily. 10/22/15   Tharon Aquas, PA   Meds Ordered and Administered this Visit  Medications - No data to display  There were no vitals taken for this visit. No data found.   Physical Exam  NURSES NOTES AND VITAL SIGNS REVIEWED. CONSTITUTIONAL: Well developed, well nourished, no acute distress HEENT: normocephalic, atraumatic EYES: Conjunctiva normal NECK:normal ROM, supple PULMONARY:No respiratory distress, normal effort, Lungs: CTAb/l CARDIOVASCULAR: RRR, no murmur ABDOMEN: soft,  ND, NT, +'ve BS MUSCULOSKELETAL: Normal ROM of all extremities SKIN: warm and dry without rash PSYCHIATRIC: Mood and affect normal    ED Course  Procedures (including critical care time)  Labs Review Labs Reviewed - No data to display  Imaging Review No results found.   Visual Acuity Review  Right Eye Distance:   Left Eye Distance:   Bilateral Distance:    Right Eye Near:   Left Eye Near:    Bilateral Near:         MDM   1. Encounter for medication refill    Rx refilled for several medications. Advised that he will need to get more permanent care so that one provider is managing his medications.     Tharon Aquas, PA 10/26/15 [phone]

## 2015-11-03 ENCOUNTER — Telehealth: Payer: Self-pay | Admitting: Family Medicine

## 2015-11-03 NOTE — Telephone Encounter (Signed)
CMA called me today 11/30. Patient was sent letter and he apologize for just calling me back from when letter was sent to address on file on 11/16. The nature of the call was to address patient  message left about not having medications

## 2015-11-03 NOTE — Telephone Encounter (Signed)
clonazePAM (KLONOPIN) 1 MG tablet sertraline (ZOLOFT) 100 MG tablet

## 2015-11-05 ENCOUNTER — Telehealth: Payer: Self-pay

## 2015-11-05 NOTE — Telephone Encounter (Signed)
Patient is returning phone call in regards to letter he received. Please f/u with pt. At [phone]

## 2015-11-05 NOTE — Telephone Encounter (Signed)
CMA called patient, patient verified name and DOB. Patient was inquiring about refills on Zoloft and Klonopin. The message below was sent to me by Dr. Venetia NightAmao after I sent a message her the message by the patient. Patient verbalized that he understood with no further questions.      Note from Dr. Venetia NightAmao:  Please refer to my note of 08/02/15 under the plan section where I had informed the patient that I would be unable to prescribe Klonopin for him but could give him Zoloft; If he has a refill of Zoloft, all he has to do is to go down to the pharmacy and pick it up.   He will need to be seen by psychiatry if he needs to remain on Klonopin.   Thanks.

## 2015-11-05 NOTE — Telephone Encounter (Signed)
error 

## 2015-11-19 ENCOUNTER — Other Ambulatory Visit: Payer: Self-pay | Admitting: *Deleted

## 2015-11-19 DIAGNOSIS — B20 Human immunodeficiency virus [HIV] disease: Secondary | ICD-10-CM

## 2015-11-19 MED ORDER — DARUNAVIR-COBICISTAT 800-150 MG PO TABS: 1.0000 | ORAL_TABLET | Freq: Every day | ORAL | Status: DC

## 2015-11-19 MED ORDER — EMTRICITABINE-TENOFOVIR AF 200-25 MG PO TABS: 1.0000 | ORAL_TABLET | Freq: Every day | ORAL | Status: DC

## 2015-12-21 ENCOUNTER — Other Ambulatory Visit: Payer: Self-pay | Admitting: *Deleted

## 2015-12-21 ENCOUNTER — Other Ambulatory Visit (INDEPENDENT_AMBULATORY_CARE_PROVIDER_SITE_OTHER): Payer: Self-pay

## 2015-12-21 DIAGNOSIS — B20 Human immunodeficiency virus [HIV] disease: Secondary | ICD-10-CM

## 2015-12-21 DIAGNOSIS — B182 Chronic viral hepatitis C: Secondary | ICD-10-CM

## 2015-12-21 MED ORDER — SULFAMETHOXAZOLE-TRIMETHOPRIM 800-160 MG PO TABS: 1.0000 | ORAL_TABLET | Freq: Two times a day (BID) | ORAL | Status: DC

## 2015-12-22 LAB — T-HELPER CELL (CD4) - (RCID CLINIC ONLY)
CD4 T CELL ABS: 100 /uL — AB (ref 400–2700)
CD4 T CELL HELPER: 11 % — AB (ref 33–55)

## 2015-12-23 LAB — HIV-1 RNA QUANT-NO REFLEX-BLD
HIV 1 RNA Quant: 26140 copies/mL — ABNORMAL HIGH (ref <20)
HIV-1 RNA Quant, Log: 4.42 Log copies/mL — ABNORMAL HIGH (ref <1.30)

## 2016-01-04 ENCOUNTER — Emergency Department (INDEPENDENT_AMBULATORY_CARE_PROVIDER_SITE_OTHER)
Admission: EM | Admit: 2016-01-04 | Discharge: 2016-01-04 | Disposition: A | Discharge status: Home / Self Care | Payer: Self-pay | Source: Home / Self Care | Attending: Family Medicine | Admitting: Family Medicine

## 2016-01-04 ENCOUNTER — Encounter (HOSPITAL_COMMUNITY): Payer: Self-pay | Admitting: *Deleted

## 2016-01-04 ENCOUNTER — Ambulatory Visit: Payer: Self-pay | Admitting: Infectious Diseases

## 2016-01-04 DIAGNOSIS — B370 Candidal stomatitis: Secondary | ICD-10-CM

## 2016-01-04 MED ORDER — FLUCONAZOLE 200 MG PO TABS: 200.0000 mg | ORAL_TABLET | Freq: Every day | ORAL | Status: AC

## 2016-01-04 MED ORDER — CLOTRIMAZOLE 10 MG MT TROC: 10.0000 mg | Freq: Every day | OROMUCOSAL | Status: DC

## 2016-01-04 MED ORDER — CHLORHEXIDINE GLUCONATE 0.12 % MT SOLN: 10.0000 mL | Freq: Four times a day (QID) | OROMUCOSAL | Status: DC

## 2016-01-04 NOTE — ED Notes (Addendum)
Pt  Reports       White  Area  On  Tongue             Pt  Also  Reports  Needs  Refills  Of  His  meds

## 2016-01-04 NOTE — ED Provider Notes (Signed)
CSN: [id number]     Arrival date & time 01/04/16  1306 History   First MD Initiated Contact with Patient 01/04/16 1352     Chief Complaint  Patient presents with  . Medication Refill   (Consider location/radiation/quality/duration/timing/severity/associated sxs/prior Treatment) Patient is a 50 y.o. male presenting with mouth sores. The history is provided by the patient.  Mouth Lesions Location:  Tongue Quality:  Painful and ulcerous Pain details:    Quality:  Burning   Severity:  Moderate   Duration:  2 weeks Progression:  Worsening Chronicity:  New Context comment:  Pt with HIV disease. Relieved by:  None tried Associated symptoms: sore throat   Associated symptoms: no fever     Past Medical History  Diagnosis Date  . Hepatitis C     never treated  . Asthma   . AIDS (HCC) 1985   . Cerebral hemorrhage (HCC) 2000  . 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 Hernia repair    . Mandible surgery    . Laporotomy    . Esophagogastroduodenoscopy (egd) with propofol N/A 07/27/2015    Procedure: ESOPHAGOGASTRODUODENOSCOPY (EGD) WITH PROPOFOL;  Surgeon: Hilarie Fredrickson, MD;  Location: [facility];  Service: Endoscopy;  Laterality: N/A;   Family History  Problem Relation Age of Onset  . CAD Mother   . Stroke Mother    Social History  Substance Use Topics  . Smoking status: Current Every Day Smoker -- 2.00 packs/day for 38 years    Types: Cigarettes  . Smokeless tobacco: Never Used  . Alcohol Use: Yes     Comment: 6 pack per week per patient     Review of Systems  Constitutional: Negative.  Negative for fever and chills.  HENT: Positive for mouth sores and sore throat. Negative for dental problem.   All other systems reviewed and are negative.   Allergies  Aspirin  Home Medications   Prior to Admission medications   Medication Sig Start Date End Date Taking? Authorizing Provider  albuterol (PROVENTIL HFA;VENTOLIN HFA) 108 (90 BASE)  MCG/ACT inhaler Inhale 1-2 puffs into the lungs every 6 (six) hours as needed for wheezing or shortness of breath. 10/22/15   Tharon Aquas, PA  chlorhexidine (PERIDEX) 0.12 % solution Use as directed 10 mLs in the mouth or throat 4 (four) times daily. Swish and spit 01/04/16   Linna Hoff, MD  clonazePAM (KLONOPIN) 1 MG tablet Take 0.5 tablets (0.5 mg total) by mouth 2 (two) times daily as needed for anxiety. 10/22/15   Tharon Aquas, PA  clotrimazole (MYCELEX) 10 MG troche Take 1 tablet (10 mg total) by mouth 5 (five) times daily. 01/04/16   Linna Hoff, MD  darunavir-cobicistat (PREZCOBIX) 800-150 MG tablet Take 1 tablet by mouth daily. Swallow whole. Do NOT crush, break or chew tablets. Take with food. 11/19/15   Randall Hiss, MD  diphenhydrAMINE (BENADRYL) 25 mg capsule Take 25 mg by mouth every 6 (six) hours as needed for allergies.    Historical Provider, MD  dronabinol (MARINOL) 2.5 MG capsule Take 1 capsule (2.5 mg total) by mouth 2 (two) times daily before a meal. 10/13/15   Ginnie Smart, MD  emtricitabine-tenofovir AF (DESCOVY) 200-25 MG tablet Take 1 tablet by mouth daily. 11/19/15   Randall Hiss, MD  fexofenadine (ALLEGRA) 180 MG tablet Take 180 mg by mouth daily.    Historical Provider, MD  fluconazole (DIFLUCAN) 200 MG tablet  Take 1 tablet (200 mg total) by mouth daily. 01/04/16 01/11/16  Linna Hoff, MD  hydroxypropyl methylcellulose (ISOPTO TEARS) 2.5 % ophthalmic solution Place 1 drop into both eyes 3 (three) times daily as needed for dry eyes.    Historical Provider, MD  Multiple Vitamin (MULTIVITAMIN WITH MINERALS) TABS tablet Take 1 tablet by mouth daily.    Historical Provider, MD  oxyCODONE (ROXICODONE) 5 MG immediate release tablet Take 1 tablet (5 mg total) by mouth every 6 (six) hours as needed for severe pain. 07/28/15   Costin Otelia Sergeant, MD  pantoprazole (PROTONIX) 40 MG tablet Take 1 tablet (40 mg total) by mouth 2 (two) times daily. 07/28/15   Costin Otelia Sergeant, MD  sertraline (ZOLOFT) 100 MG tablet Take 1 tablet (100 mg total) by mouth daily. 10/22/15   Tharon Aquas, PA  sulfamethoxazole-trimethoprim (BACTRIM DS,SEPTRA DS) 800-160 MG tablet Take 1 tablet by mouth 2 (two) times daily. 12/21/15   Randall Hiss, MD   Meds Ordered and Administered this Visit  Medications - No data to display  BP 153/90 mmHg  Pulse 94  Temp(Src) 98.5 F (36.9 C) (Oral)  Resp 16  SpO2 99% No data found.   Physical Exam  Constitutional: He is oriented to person, place, and time. He appears well-developed and well-nourished. No distress.  HENT:  Mouth/Throat: Oropharyngeal exudate present.    Neck: Normal range of motion. Neck supple.  Neurological: He is alert and oriented to person, place, and time.  Skin: Skin is warm and dry.  Nursing note and vitals reviewed.   ED Course  Procedures (including critical care time)  Labs Review Labs Reviewed - No data to display  Imaging Review No results found.   Visual Acuity Review  Right Eye Distance:   Left Eye Distance:   Bilateral Distance:    Right Eye Near:   Left Eye Near:    Bilateral Near:         MDM   1. Oral candidiasis        Linna Hoff, MD 01/04/16 2046

## 2016-01-12 ENCOUNTER — Other Ambulatory Visit: Payer: Self-pay | Admitting: Infectious Diseases

## 2016-01-12 ENCOUNTER — Ambulatory Visit (INDEPENDENT_AMBULATORY_CARE_PROVIDER_SITE_OTHER): Payer: Self-pay | Admitting: Infectious Diseases

## 2016-01-12 ENCOUNTER — Telehealth: Payer: Self-pay | Admitting: *Deleted

## 2016-01-12 ENCOUNTER — Encounter: Payer: Self-pay | Admitting: Infectious Diseases

## 2016-01-12 ENCOUNTER — Telehealth: Payer: Self-pay

## 2016-01-12 VITALS — BP 160/105 | HR 87 | Temp 98.1°F | Wt 162.0 lb

## 2016-01-12 DIAGNOSIS — B182 Chronic viral hepatitis C: Secondary | ICD-10-CM

## 2016-01-12 DIAGNOSIS — B20 Human immunodeficiency virus [HIV] disease: Secondary | ICD-10-CM

## 2016-01-12 DIAGNOSIS — J441 Chronic obstructive pulmonary disease with (acute) exacerbation: Secondary | ICD-10-CM

## 2016-01-12 MED ORDER — PREDNISONE 10 MG (21) PO TBPK: 10.0000 mg | ORAL_TABLET | Freq: Every day | ORAL | Status: DC

## 2016-01-12 MED ORDER — MOMETASONE FURO-FORMOTEROL FUM 100-5 MCG/ACT IN AERO: 2.0000 | INHALATION_SPRAY | Freq: Two times a day (BID) | RESPIRATORY_TRACT | Status: DC

## 2016-01-12 MED ORDER — DOLUTEGRAVIR SODIUM 50 MG PO TABS: 50.0000 mg | ORAL_TABLET | Freq: Every day | ORAL | Status: DC

## 2016-01-12 MED ORDER — ALBUTEROL SULFATE HFA 108 (90 BASE) MCG/ACT IN AERS: 1.0000 | INHALATION_SPRAY | Freq: Four times a day (QID) | RESPIRATORY_TRACT | Status: DC | PRN

## 2016-01-12 NOTE — Telephone Encounter (Signed)
Pt is covered by ADAP, will need Patient Assistance for rx.  Left mesage for pt and Lum Babe to obtain Patient Assistance for [facility].

## 2016-01-12 NOTE — Assessment & Plan Note (Signed)
Will refill his inhalers, steroid burst.  rtc 1-2 months

## 2016-01-12 NOTE — Addendum Note (Signed)
Addended by: Mariea Clonts D on: 01/12/2016 11:43 AM   Modules accepted: Orders

## 2016-01-12 NOTE — Telephone Encounter (Signed)
PT CALLED INQUIRING ABOUT tessalon pearls refill. States Rx was not at pharmacy when he went to pick up meds.  Req nurse to return call asap

## 2016-01-12 NOTE — Progress Notes (Signed)
   Subjective:    Patient ID: Steven [name], male    DOB: [birth year], 50 y.o.   MRN: [id number]  HPI 50 yo M with hx of HIV+ since (previously on DTVr/TRV), previously followed at [facility]. Has been positive 1985. CVA 2010.  Had CD4 200 (03-2015).   He had anal pap 06-2011 which showed ASCUS.  Was treated shingles. Had CVA 2010 at [facility].  Had GI bleed 07-2015 at [facility]- linear, mild esophageal tear.   Seen in ED on 1-31 and was dx with thrush. Was given fluconazole.  Is on DRVc/descovy.  "i'm in a bad way". In a lot of pain.  Would like klonipin, oxycodone refill.  Is feeling overwhelmed trying to keep up with all his appts.  Would like refill of his inhalers.  Has been losing wt.   HIV 1 RNA QUANT (copies/mL)  Date Value  12/21/2015 26140*  10/13/2015 34680*  07/26/2015 <20   CD4 T CELL ABS (/uL)  Date Value  12/21/2015 100*  10/13/2015 80*  07/26/2015 70*    Review of Systems  Constitutional: Positive for fever, chills and unexpected weight change. Negative for appetite change.  Respiratory: Positive for cough and shortness of breath.   Cardiovascular: Negative for chest pain.  Gastrointestinal: Negative for diarrhea and constipation.  Genitourinary: Negative for difficulty urinating.  Musculoskeletal: Positive for arthralgias.  Skin: Positive for rash.  Neurological: Positive for headaches.      Objective:   Physical Exam  Constitutional: He appears well-developed and well-nourished.  HENT:  Mouth/Throat: No oropharyngeal exudate.  Eyes: EOM are normal. Pupils are equal, round, and reactive to light.  Neck: Neck supple.  Cardiovascular: Regular rhythm and normal heart sounds.  Tachycardia present.   Pulmonary/Chest: He has rhonchi.  Abdominal: Soft. Bowel sounds are normal. There is no tenderness. There is no rebound.  Musculoskeletal: He exhibits no edema.  Lymphadenopathy:    He has no cervical adenopathy.      Assessment & Plan:

## 2016-01-12 NOTE — Telephone Encounter (Signed)
Patient requesting refill of tessalon pearls. Not mentioned in today's visit, not on active med list. Please advise. Andree Coss, RN

## 2016-01-12 NOTE — Assessment & Plan Note (Addendum)
Will check his genotype.  Will consider change to DTGV/Descovy to facilitate Hep C treatment Offered/refused condoms.  Offered ambre which he refuses.  Will get him in with PCP.  Ginette Pitman is resolved.  Bactrim qday.  Will see him back in 1-2 months.

## 2016-01-12 NOTE — Assessment & Plan Note (Signed)
Wants to defer treatment.  Explained to him newer rx- will speak with pharm.

## 2016-01-13 ENCOUNTER — Other Ambulatory Visit: Payer: Self-pay | Admitting: Infectious Diseases

## 2016-01-13 DIAGNOSIS — J441 Chronic obstructive pulmonary disease with (acute) exacerbation: Secondary | ICD-10-CM

## 2016-01-13 LAB — HIV-1 RNA ULTRAQUANT REFLEX TO GENTYP+
HIV 1 RNA QUANT: 29970 {copies}/mL — AB (ref <20)
HIV-1 RNA QUANT, LOG: 4.48 {Log_copies}/mL — AB (ref <1.30)

## 2016-01-13 MED ORDER — BENZONATATE 100 MG PO CAPS: 100.0000 mg | ORAL_CAPSULE | Freq: Three times a day (TID) | ORAL | Status: DC | PRN

## 2016-01-13 NOTE — Progress Notes (Signed)
Thanks

## 2016-01-14 ENCOUNTER — Telehealth: Payer: Self-pay | Admitting: *Deleted

## 2016-01-14 NOTE — Telephone Encounter (Signed)
Patient will come in Monday to speak with someone regarding patient assistance for his inhalers - these are not covered by adap, he cannot afford the out-of-pocket expense. Andree Coss, RN

## 2016-01-15 LAB — LIVER FIBROSIS, FIBROTEST-ACTITEST
ALPHA-2-MACROGLOBULIN: 449 mg/dL — AB (ref 106–279)
ALT: 88 U/L — ABNORMAL HIGH (ref 9–46)
Apolipoprotein A1: 128 mg/dL (ref 94–176)
BILIRUBIN: 0.5 mg/dL (ref 0.2–1.2)
FIBROSIS SCORE: 0.89
GGT: 437 U/L — ABNORMAL HIGH (ref 3–95)
Haptoglobin: 78 mg/dL (ref 43–212)
NECROINFLAMMAT ACT SCORE: 0.72
REFERENCE ID: 1436415

## 2016-01-17 ENCOUNTER — Other Ambulatory Visit: Payer: Self-pay | Admitting: Pharmacist Clinician (PhC)/ Clinical Pharmacy Specialist

## 2016-01-17 LAB — HCV RNA NS5A DRUG RESISTANCE

## 2016-01-17 MED ORDER — ELBASVIR-GRAZOPREVIR 50-100 MG PO TABS: 1.0000 | ORAL_TABLET | Freq: Every day | ORAL | Status: DC

## 2016-01-18 ENCOUNTER — Encounter: Payer: Self-pay | Admitting: Pharmacy Technician

## 2016-01-19 LAB — HIV-1 INTEGRASE GENOTYPE

## 2016-01-19 LAB — HLA B*5701: HLA-B*5701 w/rflx HLA-B High: NEGATIVE

## 2016-01-20 ENCOUNTER — Other Ambulatory Visit: Payer: Self-pay | Admitting: *Deleted

## 2016-01-20 DIAGNOSIS — J441 Chronic obstructive pulmonary disease with (acute) exacerbation: Secondary | ICD-10-CM

## 2016-01-20 MED ORDER — MOMETASONE FURO-FORMOTEROL FUM 100-5 MCG/ACT IN AERO: 2.0000 | INHALATION_SPRAY | Freq: Two times a day (BID) | RESPIRATORY_TRACT | Status: DC

## 2016-01-20 NOTE — Telephone Encounter (Signed)
Received message patient needed assistance with paying for dulera. This is covered by [employer] and since it is not on the [employer]ap formulary patient can continue getting through [employer]. Sent precription to them and should have it shipped out to the patient tomorrow (2/17). Tried calling the patient but phone has no service.

## 2016-01-25 LAB — HIV-1 GENOTYPR PLUS

## 2016-02-09 ENCOUNTER — Other Ambulatory Visit: Payer: Self-pay | Admitting: *Deleted

## 2016-02-09 DIAGNOSIS — B20 Human immunodeficiency virus [HIV] disease: Secondary | ICD-10-CM

## 2016-02-09 MED ORDER — SULFAMETHOXAZOLE-TRIMETHOPRIM 800-160 MG PO TABS: 1.0000 | ORAL_TABLET | Freq: Every day | ORAL | Status: DC

## 2016-02-16 ENCOUNTER — Telehealth: Payer: Self-pay | Admitting: *Deleted

## 2016-02-16 ENCOUNTER — Other Ambulatory Visit: Payer: Self-pay | Admitting: Infectious Diseases

## 2016-02-16 NOTE — Telephone Encounter (Signed)
Patient called to advise he needs refills on his Zoloft and Tessalon pearls he had to leave a message. Tried to call him back at the number he left with the message and was unable to reach [phone].   Left message for him on his cell and advised him he has refills on his Tessalon pearls so call the pharmacy and we are not filling his Zoloft and he needs to follow up at Behavioral [facility] asap for that.

## 2016-03-01 ENCOUNTER — Other Ambulatory Visit: Payer: Self-pay | Admitting: Infectious Diseases

## 2016-03-01 ENCOUNTER — Other Ambulatory Visit: Payer: Self-pay | Admitting: *Deleted

## 2016-03-01 DIAGNOSIS — F32A Depression, unspecified: Secondary | ICD-10-CM

## 2016-03-01 DIAGNOSIS — F329 Major depressive disorder, single episode, unspecified: Secondary | ICD-10-CM

## 2016-03-01 DIAGNOSIS — B20 Human immunodeficiency virus [HIV] disease: Secondary | ICD-10-CM

## 2016-03-01 MED ORDER — CLONAZEPAM 1 MG PO TABS: 0.5000 mg | ORAL_TABLET | Freq: Two times a day (BID) | ORAL | Status: DC | PRN

## 2016-03-01 MED ORDER — DRONABINOL 2.5 MG PO CAPS: 2.5000 mg | ORAL_CAPSULE | Freq: Two times a day (BID) | ORAL | Status: DC

## 2016-03-01 MED ORDER — SERTRALINE HCL 100 MG PO TABS: 100.0000 mg | ORAL_TABLET | Freq: Every day | ORAL | Status: DC

## 2016-03-08 ENCOUNTER — Ambulatory Visit: Payer: Self-pay | Admitting: Infectious Diseases

## 2016-03-09 ENCOUNTER — Ambulatory Visit: Payer: Self-pay | Admitting: Infectious Diseases

## 2016-03-22 ENCOUNTER — Other Ambulatory Visit: Payer: Self-pay | Admitting: Infectious Diseases

## 2016-03-22 ENCOUNTER — Telehealth: Payer: Self-pay | Admitting: *Deleted

## 2016-03-22 NOTE — Telephone Encounter (Signed)
Fax received for Benzonatate 100 mg capsules for refill.  MD given request for refills.

## 2016-03-23 ENCOUNTER — Other Ambulatory Visit: Payer: Self-pay | Admitting: Infectious Diseases

## 2016-03-27 ENCOUNTER — Other Ambulatory Visit: Payer: Self-pay | Admitting: *Deleted

## 2016-03-27 MED ORDER — BENZONATATE 100 MG PO CAPS: ORAL_CAPSULE | ORAL | Status: DC

## 2016-03-30 ENCOUNTER — Other Ambulatory Visit: Payer: Self-pay | Admitting: Infectious Diseases

## 2016-03-31 ENCOUNTER — Telehealth: Payer: Self-pay | Admitting: *Deleted

## 2016-03-31 NOTE — Telephone Encounter (Signed)
Refill request from pharmacy for klonopin. Last filled by Dr. Ninetta LightsHatcher on 03/01/16 via RN. Please advise if ok to refill. Thanks Wendall MolaJacqueline Cockerham

## 2016-04-04 NOTE — Telephone Encounter (Signed)
He needs to get this from pcp or psych thanks

## 2016-04-05 NOTE — Telephone Encounter (Signed)
Pharmacy notified.

## 2016-04-06 ENCOUNTER — Telehealth: Payer: Self-pay | Admitting: *Deleted

## 2016-04-06 NOTE — Telephone Encounter (Signed)
Dr. Ninetta LightsHatcher, please advise about refilling the clonazepam prescription, # of tablets / refills.

## 2016-04-07 NOTE — Telephone Encounter (Signed)
Thank you for your commitment to excellence 

## 2016-04-07 NOTE — Telephone Encounter (Signed)
Patient previously notified, RN notified pharmacy. Steven [name], Jacquel Redditt M, RN

## 2016-04-07 NOTE — Telephone Encounter (Signed)
Pt needs [facility] to fill this thanks

## 2016-04-11 ENCOUNTER — Encounter: Payer: Self-pay | Admitting: Infectious Diseases

## 2016-04-18 ENCOUNTER — Telehealth: Payer: Self-pay

## 2016-04-18 NOTE — Telephone Encounter (Signed)
Received refill request for Clonazepam 1MG  from [facility]. Dr. Ninetta LightsHatcher denied refill and stated [facility]. Attempted twice to call patient and to make aware but was unable to get in touch with patient. No voicemail set up. Rejeana Brockandace Arael Piccione, LPN.

## 2016-05-08 ENCOUNTER — Other Ambulatory Visit: Payer: Self-pay | Admitting: Infectious Diseases

## 2016-05-08 DIAGNOSIS — B20 Human immunodeficiency virus [HIV] disease: Secondary | ICD-10-CM

## 2016-05-20 ENCOUNTER — Other Ambulatory Visit: Payer: Self-pay | Admitting: Infectious Diseases

## 2016-05-29 ENCOUNTER — Other Ambulatory Visit: Payer: Self-pay | Admitting: Infectious Diseases

## 2016-05-29 ENCOUNTER — Telehealth: Payer: Self-pay | Admitting: *Deleted

## 2016-05-29 DIAGNOSIS — Z21 Asymptomatic human immunodeficiency virus [HIV] infection status: Secondary | ICD-10-CM

## 2016-05-29 DIAGNOSIS — B1710 Acute hepatitis C without hepatic coma: Secondary | ICD-10-CM

## 2016-05-29 NOTE — Telephone Encounter (Signed)
Patient called requesting a refill on the tessalon pearls (last filled 03/2016), he is coughing frequently on phone conversation. Patient stated he is now homeless and living on the streets with his dog. He also requested an appt with Franne FortsKenny Shore and I scheduled him for Wednesday 05/31/16. He wanted a refill on Zoloft and explained that is from his mental health providers and he plans to go there tomorrow. Also scheduled him for labs on Wednesday and he has a follow up with Dr. Ninetta LightsHatcher in August. Please advise on cough Rx. Wendall MolaJacqueline Ahliyah Nienow

## 2016-05-30 ENCOUNTER — Other Ambulatory Visit: Payer: Self-pay | Admitting: *Deleted

## 2016-05-30 MED ORDER — BENZONATATE 100 MG PO CAPS: ORAL_CAPSULE | ORAL | Status: DC

## 2016-05-30 NOTE — Telephone Encounter (Signed)
Rx sent and patient notified.

## 2016-05-31 ENCOUNTER — Ambulatory Visit: Payer: Self-pay

## 2016-05-31 ENCOUNTER — Telehealth: Payer: Self-pay | Admitting: Infectious Diseases

## 2016-05-31 ENCOUNTER — Other Ambulatory Visit (INDEPENDENT_AMBULATORY_CARE_PROVIDER_SITE_OTHER): Payer: Self-pay

## 2016-05-31 DIAGNOSIS — B1710 Acute hepatitis C without hepatic coma: Secondary | ICD-10-CM

## 2016-05-31 DIAGNOSIS — B20 Human immunodeficiency virus [HIV] disease: Secondary | ICD-10-CM

## 2016-05-31 DIAGNOSIS — Z21 Asymptomatic human immunodeficiency virus [HIV] infection status: Secondary | ICD-10-CM

## 2016-05-31 DIAGNOSIS — B1920 Unspecified viral hepatitis C without hepatic coma: Secondary | ICD-10-CM

## 2016-05-31 NOTE — Telephone Encounter (Signed)
Returned patient's call from Monday 05/29/16, he needs a refill on his inhalers. Currently he is getting these through [employer] but the application has expired, this has to be renewed every 6 months. Need updated phone number and address for patient and will complete new [employer] application. Attempted call back to patient, no voicemail setup. Patient did say on my voicemail his phone did not have many minutes left. Will try to attempt call again tomorrow morning.

## 2016-06-01 ENCOUNTER — Ambulatory Visit: Payer: Self-pay

## 2016-06-01 ENCOUNTER — Other Ambulatory Visit: Payer: Self-pay

## 2016-06-01 DIAGNOSIS — J441 Chronic obstructive pulmonary disease with (acute) exacerbation: Secondary | ICD-10-CM

## 2016-06-01 LAB — COMPLETE METABOLIC PANEL WITH GFR
ALBUMIN: 4.2 g/dL (ref 3.6–5.1)
ALK PHOS: 71 U/L (ref 40–115)
ALT: 17 U/L (ref 9–46)
AST: 28 U/L (ref 10–35)
BUN: 11 mg/dL (ref 7–25)
CALCIUM: 8.8 mg/dL (ref 8.6–10.3)
CO2: 21 mmol/L (ref 20–31)
CREATININE: 0.92 mg/dL (ref 0.70–1.33)
Chloride: 102 mmol/L (ref 98–110)
GFR, Est Non African American: >89 mL/min (ref >=60)
Glucose, Bld: 84 mg/dL (ref 65–99)
Potassium: 4 mmol/L (ref 3.5–5.3)
Sodium: 131 mmol/L — ABNORMAL LOW (ref 135–146)
TOTAL PROTEIN: 8.3 g/dL — AB (ref 6.1–8.1)
Total Bilirubin: 0.4 mg/dL (ref 0.2–1.2)

## 2016-06-01 LAB — HIV-1 RNA QUANT-NO REFLEX-BLD
HIV 1 RNA QUANT: 56483 {copies}/mL — AB (ref <20)
HIV-1 RNA Quant, Log: 4.75 Log copies/mL — ABNORMAL HIGH (ref <1.30)

## 2016-06-01 MED ORDER — MOMETASONE FURO-FORMOTEROL FUM 100-5 MCG/ACT IN AERO: 2.0000 | INHALATION_SPRAY | Freq: Two times a day (BID) | RESPIRATORY_TRACT | Status: DC

## 2016-06-01 NOTE — Telephone Encounter (Signed)
[name] requested script for [facility].  Dulera  .   Laurell Josephsammy K Roxanne Panek, RN

## 2016-06-02 LAB — T-HELPER CELL (CD4) - (RCID CLINIC ONLY)
CD4 % Helper T Cell: 11 % — ABNORMAL LOW (ref 33–55)
CD4 T CELL ABS: 200 /uL — AB (ref 400–2700)

## 2016-06-02 LAB — HEPATITIS C RNA QUANTITATIVE: HCV QUANT: NOT DETECTED [IU]/mL (ref <15)

## 2016-06-03 ENCOUNTER — Other Ambulatory Visit: Payer: Self-pay | Admitting: Infectious Diseases

## 2016-06-04 ENCOUNTER — Other Ambulatory Visit: Payer: Self-pay | Admitting: Infectious Diseases

## 2016-06-04 DIAGNOSIS — B20 Human immunodeficiency virus [HIV] disease: Secondary | ICD-10-CM

## 2016-06-05 ENCOUNTER — Ambulatory Visit: Payer: Self-pay

## 2016-06-05 DIAGNOSIS — F32A Depression, unspecified: Secondary | ICD-10-CM

## 2016-06-05 DIAGNOSIS — F329 Major depressive disorder, single episode, unspecified: Secondary | ICD-10-CM

## 2016-06-05 NOTE — BH Specialist Note (Signed)
I met with Steven [name] for the first time after several cancelled appointments by him due to transportation issues. He expressed his frustration with various things - especially that he is currently homeless and has no income.  He said he is applying for disability through a lawyer.  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 He was seen by [facility] in March and April of this year, but has not shown up for several scheduled appointments there.  I plan to discuss this patient with staff to determine the best route forward. I also gave him Amy's card from [facility] and she agreed to talk to him about returning to their care.  He says he will call and schedule his next appointment. [name], LCSW 

## 2016-06-07 ENCOUNTER — Other Ambulatory Visit: Payer: Self-pay | Admitting: *Deleted

## 2016-06-07 ENCOUNTER — Telehealth: Payer: Self-pay | Admitting: *Deleted

## 2016-06-07 DIAGNOSIS — F32A Depression, unspecified: Secondary | ICD-10-CM

## 2016-06-07 DIAGNOSIS — F329 Major depressive disorder, single episode, unspecified: Secondary | ICD-10-CM

## 2016-06-07 MED ORDER — BENZONATATE 100 MG PO CAPS: ORAL_CAPSULE | ORAL | Status: DC

## 2016-06-07 NOTE — Telephone Encounter (Signed)
Patient stated "someone stole his cough medication". He is referring to the tesselon pearles and I told him I would have to ask the MD if ok to resend Rx and this request may be denied. [name], patient assistance has ordered his inhalers through [employer] and we are awaiting delivery of these meds. Please advise

## 2016-06-07 NOTE — Telephone Encounter (Signed)
Patient requesting an Rx for Zoloft from Dr. Ninetta LightsHatcher. Please see Harle StanfordKenny Shores office note. Bernette RedbirdKenny is aware that Dr. Ninetta LightsHatcher does not return to clinic until 06/12/16. Wendall MolaJacqueline Phill Steck

## 2016-06-07 NOTE — Telephone Encounter (Signed)
Rx sent, thanks 

## 2016-06-07 NOTE — Telephone Encounter (Signed)
Ok to refill 

## 2016-06-12 NOTE — Telephone Encounter (Signed)
Please refill his previous zoloft rx thanks

## 2016-06-12 NOTE — Telephone Encounter (Signed)
Dr. Ninetta LightsHatcher please advise about refill for sertraline.

## 2016-06-13 MED ORDER — SERTRALINE HCL 100 MG PO TABS: 100.0000 mg | ORAL_TABLET | Freq: Every day | ORAL | Status: DC

## 2016-06-13 NOTE — Telephone Encounter (Signed)
Refill completed.

## 2016-06-13 NOTE — Addendum Note (Signed)
Addended by: Jennet MaduroESTRIDGE, Livi Mcgann D on: 06/13/2016 02:08 PM   Modules accepted: Orders

## 2016-06-15 ENCOUNTER — Emergency Department (HOSPITAL_COMMUNITY)
Admission: EM | Admit: 2016-06-15 | Discharge: 2016-06-15 | Disposition: A | Discharge status: Home / Self Care | Payer: Self-pay | Attending: Emergency Medicine | Admitting: Emergency Medicine

## 2016-06-15 ENCOUNTER — Encounter (HOSPITAL_COMMUNITY): Payer: Self-pay | Admitting: Emergency Medicine

## 2016-06-15 DIAGNOSIS — J45909 Unspecified asthma, uncomplicated: Secondary | ICD-10-CM | POA: Insufficient documentation

## 2016-06-15 DIAGNOSIS — B3781 Candidal esophagitis: Secondary | ICD-10-CM | POA: Insufficient documentation

## 2016-06-15 DIAGNOSIS — Z206 Contact with and (suspected) exposure to human immunodeficiency virus [HIV]: Secondary | ICD-10-CM | POA: Insufficient documentation

## 2016-06-15 DIAGNOSIS — F17210 Nicotine dependence, cigarettes, uncomplicated: Secondary | ICD-10-CM | POA: Insufficient documentation

## 2016-06-15 DIAGNOSIS — R21 Rash and other nonspecific skin eruption: Secondary | ICD-10-CM | POA: Insufficient documentation

## 2016-06-15 MED ORDER — BENZONATATE 100 MG PO CAPS
100.0000 mg | ORAL_CAPSULE | Freq: Once | ORAL | Status: AC
  Administered 2016-06-15: 100 mg via ORAL
  Filled 2016-06-15: qty 1

## 2016-06-15 MED ORDER — ALBUTEROL SULFATE HFA 108 (90 BASE) MCG/ACT IN AERS
1.0000 | INHALATION_SPRAY | Freq: Once | RESPIRATORY_TRACT | Status: AC
  Administered 2016-06-15: 1 via RESPIRATORY_TRACT
  Filled 2016-06-15: qty 6.7

## 2016-06-15 MED ORDER — FLUCONAZOLE 200 MG PO TABS
200.0000 mg | ORAL_TABLET | Freq: Once | ORAL | Status: AC
  Administered 2016-06-15: 200 mg via ORAL
  Filled 2016-06-15: qty 1

## 2016-06-15 MED ORDER — CHLORHEXIDINE GLUCONATE 0.12 % MT SOLN: 10.0000 mL | Freq: Four times a day (QID) | OROMUCOSAL | Status: DC

## 2016-06-15 MED ORDER — FLUCONAZOLE 200 MG PO TABS: 200.0000 mg | ORAL_TABLET | Freq: Every day | ORAL | Status: AC

## 2016-06-15 MED ORDER — HYDROCODONE-ACETAMINOPHEN 5-325 MG PO TABS
1.0000 | ORAL_TABLET | Freq: Once | ORAL | Status: AC
  Administered 2016-06-15: 1 via ORAL
  Filled 2016-06-15: qty 1

## 2016-06-15 NOTE — ED Notes (Addendum)
Pt transported by EMS for cough. Pt requesting Tessalon and pain medication. [facility] but he is unable to afford the $11 copay.  Pt c/o red rash to entire body 3-4 weeks, pt states he is not suppose to be in the sun but he has no choice d/t homelessness Pt requesting medication for oral thrush Pt requesting copy of referral to pain clinic and for it to be faxed. Pt states Dr. Ninetta LightsHatcher is out of town is unable to get an appointment Pt states he is in respiratory distress, attempts made to explain to patient he is not in distress at this time, VSS, NAD noted

## 2016-06-15 NOTE — ED Provider Notes (Signed)
CSN: [id number]     Arrival date & time 06/15/16  0016 History   First MD Initiated Contact with Patient 06/15/16 [phone]     Chief Complaint  Patient presents with  . Pain     (Consider location/radiation/quality/duration/timing/severity/associated sxs/prior Treatment) HPI Comments: Pt comes in with cc of cough and painful throat. Hx of AIDS, Hepatitis. Reports that he has been having cough for the past few days, not productive. No associated fevers, chills. Pt has hx of asthma, and occasionally he has a wheezing. Pt unable to pick up tessalon due to $11 copay. Pt also has rash x 3-4 weeks. Rash is slightly itchy, not painful. No hx of rash. Thinks it could be due to new hepatitis medicine he started around that time, but the medicine is working well as his jaundice has cleared up. No skin sloughing. No bruising. Pt also has c/o oral thrush, started in the last week, pt has pain with swallowing. Hx of thrush before.   ROS 10 Systems reviewed and are negative for acute change except as noted in the HPI.     The history is provided by the patient.    Past Medical History  Diagnosis Date  . Hepatitis C     never treated  . Asthma   . AIDS (HCC) 1985   . Cerebral hemorrhage (HCC) 2000  . 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 Hernia repair    . Mandible surgery    . Laporotomy    . Esophagogastroduodenoscopy (egd) with propofol N/A 07/27/2015    Procedure: ESOPHAGOGASTRODUODENOSCOPY (EGD) WITH PROPOFOL;  Surgeon: Hilarie Fredrickson, MD;  Location: [facility] ENDOSCOPY;  Service: Endoscopy;  Laterality: N/A;   Family History  Problem Relation Age of Onset  . CAD Mother   . Stroke Mother    Social History  Substance Use Topics  . Smoking status: Current Every Day Smoker -- 2.00 packs/day for 38 years    Types: Cigarettes  . Smokeless tobacco: Never Used  . Alcohol Use: 0.0 oz/week    0 Standard drinks or equivalent per week     Comment: 6 pack per week  per patient     Review of Systems    Allergies  Aspirin  Home Medications   Prior to Admission medications   Medication Sig Start Date End Date Taking? Authorizing Provider  albuterol (PROVENTIL HFA;VENTOLIN HFA) 108 (90 Base) MCG/ACT inhaler Inhale 1-2 puffs into the lungs every 6 (six) hours as needed for wheezing or shortness of breath. 01/12/16   Ginnie Smart, MD  benzonatate (TESSALON) 100 MG capsule TAKE 1 CAPSULE(100 MG) BY MOUTH THREE TIMES DAILY AS NEEDED FOR COUGH 06/07/16   Gardiner Barefoot, MD  chlorhexidine (PERIDEX) 0.12 % solution Use as directed 10 mLs in the mouth or throat 4 (four) times daily. Swish and spit 06/15/16   Derwood Kaplan, MD  clonazePAM (KLONOPIN) 1 MG tablet Take 0.5 tablets (0.5 mg total) by mouth 2 (two) times daily as needed for anxiety. 03/01/16   Ginnie Smart, MD  diphenhydrAMINE (BENADRYL) 25 mg capsule Take 25 mg by mouth every 6 (six) hours as needed for allergies.    Historical Provider, MD  dolutegravir (TIVICAY) 50 MG tablet Take 1 tablet (50 mg total) by mouth daily. 01/12/16   Ginnie Smart, MD  dronabinol (MARINOL) 2.5 MG capsule TAKE ONE CAPSULE BY MOUTH TWICE DAILY BEFORE A MEAL 05/09/16   Ginnie Smart, MD  Elbasvir-Grazoprevir (  ZEPATIER) 50-100 MG TABS Take 1 tablet by mouth daily. 01/17/16   Ginnie SmartJeffrey C Hatcher, MD  emtricitabine-tenofovir AF (DESCOVY) 200-25 MG tablet Take 1 tablet by mouth daily. 11/19/15   Randall Hissornelius N Van Dam, MD  fluconazole (DIFLUCAN) 200 MG tablet Take 1 tablet (200 mg total) by mouth daily. 06/15/16 06/22/16  Derwood KaplanAnkit Eavan Gonterman, MD  hydroxypropyl methylcellulose (ISOPTO TEARS) 2.5 % ophthalmic solution Place 1 drop into both eyes 3 (three) times daily as needed for dry eyes.    Historical Provider, MD  mometasone-formoterol (DULERA) 100-5 MCG/ACT AERO Inhale 2 puffs into the lungs 2 (two) times daily. 06/01/16   Ginnie SmartJeffrey C Hatcher, MD  Multiple Vitamin (MULTIVITAMIN WITH MINERALS) TABS tablet Take 1 tablet by mouth  daily.    Historical Provider, MD  oxyCODONE (ROXICODONE) 5 MG immediate release tablet Take 1 tablet (5 mg total) by mouth every 6 (six) hours as needed for severe pain. Patient not taking: Reported on 01/12/2016 07/28/15   Leatha Gildingostin M Gherghe, MD  predniSONE (STERAPRED UNI-PAK 21 TAB) 10 MG (21) TBPK tablet Take 1 tablet (10 mg total) by mouth daily. 4 tabs on day 1 and 2, 3 tabs on day 3 and 4, 2 tabs on day 5 and 6, 1 tab on days 7-9 01/12/16   Ginnie SmartJeffrey C Hatcher, MD  sertraline (ZOLOFT) 100 MG tablet Take 1 tablet (100 mg total) by mouth daily. 06/13/16   Ginnie SmartJeffrey C Hatcher, MD  sulfamethoxazole-trimethoprim (BACTRIM DS,SEPTRA DS) 800-160 MG tablet TAKE 1 TABLET BY MOUTH DAILY 06/05/16   Ginnie SmartJeffrey C Hatcher, MD   BP 164/111 mmHg  Pulse 63  Temp(Src) 98.2 F (36.8 C) (Oral)  Resp 18  SpO2 97% Physical Exam  Constitutional: He is oriented to person, place, and time. He appears well-developed.  HENT:  Head: Normocephalic and atraumatic.  Mouth/Throat: No oropharyngeal exudate.  Pt has thrush on the tongue. Posterior pharynx is clear  Eyes: Conjunctivae and EOM are normal. Pupils are equal, round, and reactive to light.  Neck: Normal range of motion. Neck supple. No JVD present.  Cardiovascular: Normal rate, regular rhythm and normal heart sounds.   Pulmonary/Chest: Effort normal and breath sounds normal. No respiratory distress. He has no wheezes. He has no rales. He exhibits no tenderness.  Abdominal: Soft. Bowel sounds are normal. He exhibits no distension. There is no tenderness. There is no rebound and no guarding.  Lymphadenopathy:    He has no cervical adenopathy.  Neurological: He is alert and oriented to person, place, and time.  Skin: Skin is warm. Rash noted.  Uniform generalized skin eruption in bilateral forearm. Raiseed Erythematous lesions w/o pustules or vesicles.   Nursing note and vitals reviewed.   ED Course  Procedures (including critical care time) Labs Review Labs Reviewed  - No data to display  Imaging Review No results found. I have personally reviewed and evaluated these images and lab results as part of my medical decision-making.   EKG Interpretation None      MDM   Final diagnoses:  Candida esophagitis (HCC)  Rash    Pt has rash x 3 weeks - not sloughing off. Oral mucosa reveals no bleeding ulcers. PT has no ocular complains with that. Rash could be allergic - or manifestation of HIV. We will allow pt's outpatient f.u to help make better decision. No patches and plaques - so i don't think it is Kaposi's - but pt to see Dr. Ninetta LightsHatcher this month which is reassuring.  Pt has clear lungs. He has  cough - not new. No need for CXR or labs in light of normal lung exam and no fevers, chills, sweats, tachypnea, hypoxia.  Pt also has candida thrush and likely esophagitis - and we will treat them with meds. Pt reports meds covered at [facility] through a grant he has qualified for.    Derwood Kaplan, MD 06/15/16 [phone]

## 2016-06-15 NOTE — Discharge Instructions (Signed)
Please see the ID doctor within the next 2 weeks.   Esophagitis Esophagitis is inflammation of the esophagus. The esophagus is the tube that carries food and liquids from your mouth to your stomach. Esophagitis can cause soreness or pain in the esophagus. This condition can make it difficult and painful to swallow.  CAUSES Most causes of esophagitis are not serious. Common causes of this condition include:  Gastroesophageal reflux disease (GERD). This is when stomach contents move back up into the esophagus (reflux).  Repeated vomiting.  An allergic-type reaction, especially caused by food allergies (eosinophilic esophagitis).  Injury to the esophagus by swallowing large pills with or without water, or swallowing certain types of medicines.  Swallowing (ingesting) harmful chemicals, such as household cleaning products.  Heavy alcohol use.  An infection of the esophagus.This most often occurs in people who have a weakened immune system.  Radiation or chemotherapy treatment for cancer.  Certain diseases such as sarcoidosis, Crohn disease, and scleroderma. SYMPTOMS Symptoms of this condition include:  Difficult or painful swallowing.  Pain with swallowing acidic liquids, such as citrus juices.  Pain with burping.  Chest pain.  Difficulty breathing.  Nausea.  Vomiting.  Pain in the abdomen.  Weight loss.  Ulcers in the mouth.  Patches of white material in the mouth (candidiasis).  Fever.  Coughing up blood or vomiting blood.  Stool that is black, tarry, or bright red. DIAGNOSIS Your health care provider will take a medical history and perform a physical exam. You may also have other tests, including:  An endoscopy to examine your stomach and esophagus with a small camera.  A test that measures the acidity level in your esophagus.  A test that measures how much pressure is on your esophagus.  A barium swallow or modified barium swallow to show the shape,  size, and functioning of your esophagus.  Allergy tests. TREATMENT Treatment for this condition depends on the cause of your esophagitis. In some cases, steroids or other medicines may be given to help relieve your symptoms or to treat the underlying cause of your condition. You may have to make some lifestyle changes, such as:  Avoiding alcohol.  Quitting smoking.  Changing your diet.  Exercising.  Changing your sleep habits and your sleep environment. HOME CARE INSTRUCTIONS Take these actions to decrease your discomfort and to help avoid complications. Diet  Follow a diet as recommended by your health care provider. This may involve avoiding foods and drinks such as:  Coffee and tea (with or without caffeine).  Drinks that contain alcohol.  Energy drinks and sports drinks.  Carbonated drinks or sodas.  Chocolate and cocoa.  Peppermint and mint flavorings.  Garlic and onions.  Horseradish.  Spicy and acidic foods, including peppers, chili powder, curry powder, vinegar, hot sauces, and barbecue sauce.  Citrus fruit juices and citrus fruits, such as oranges, lemons, and limes.  Tomato-based foods, such as red sauce, chili, salsa, and pizza with red sauce.  Fried and fatty foods, such as donuts, french fries, potato chips, and high-fat dressings.  High-fat meats, such as hot dogs and fatty cuts of red and white meats, such as rib eye steak, sausage, ham, and bacon.  High-fat dairy items, such as whole milk, butter, and cream cheese.  Eat small, frequent meals instead of large meals.  Avoid drinking large amounts of liquid with your meals.  Avoid eating meals during the 2-3 hours before bedtime.  Avoid lying down right after you eat.  Do not exercise  right after you eat.  Avoid foods and drinks that seem to make your symptoms worse. General Instructions  Pay attention to any changes in your symptoms.  Take over-the-counter and prescription medicines only  as told by your health care provider. Do not take aspirin, ibuprofen, or other NSAIDs unless your health care provider told you to do so.  If you have trouble taking pills, use a pill splitter to decrease the size of the pill. This will decrease the chance of the pill getting stuck or injuring your esophagus on the way down. Also, drink water after you take a pill.  Do not use any tobacco products, including cigarettes, chewing tobacco, and e-cigarettes. If you need help quitting, ask your health care provider.  Wear loose-fitting clothing. Do not wear anything tight around your waist that causes pressure on your abdomen.  Raise (elevate) the head of your bed about 6 inches (15 cm).  Try to reduce your stress, such as with yoga or meditation. If you need help reducing stress, ask your health care provider.  If you are overweight, reduce your weight to an amount that is healthy for you. Ask your health care provider for guidance about a safe weight loss goal.  Keep all follow-up visits as told by your health care provider. This is important. SEEK MEDICAL CARE IF:  You have new symptoms.  You have unexplained weight loss.  You have difficulty swallowing, or it hurts to swallow.  You have wheezing or a persistent cough.  Your symptoms do not improve with treatment.  You have frequent heartburn for more than two weeks. SEEK IMMEDIATE MEDICAL CARE IF:  You have severe pain in your arms, neck, jaw, teeth, or back.  You feel sweaty, dizzy, or light-headed.  You have chest pain or shortness of breath.  You vomit and your vomit looks like blood or coffee grounds.  Your stool is bloody or black.  You have a fever.  You cannot swallow, drink, or eat.   This information is not intended to replace advice given to you by your health care provider. Make sure you discuss any questions you have with your health care provider.   Document Released: 12/28/2004 Document Revised: 08/11/2015  Document Reviewed: 03/17/2015 Elsevier Interactive Patient Education [employer]

## 2016-07-13 ENCOUNTER — Other Ambulatory Visit: Payer: Self-pay | Admitting: Internal Medicine

## 2016-07-17 ENCOUNTER — Ambulatory Visit: Payer: Self-pay

## 2016-07-17 ENCOUNTER — Ambulatory Visit: Payer: Self-pay | Admitting: Infectious Diseases

## 2016-07-17 DIAGNOSIS — F331 Major depressive disorder, recurrent, moderate: Secondary | ICD-10-CM

## 2016-07-17 NOTE — BH Specialist Note (Signed)
Beverely PaceBryant was in a pleasant mood today and reported that he is still connected with [facility] (so I will not be able to report this session under [facility] funding). He said he had limited time on his phone and wanted to call Malva CoganKim Martin, [facility], so I did so for him and let him talk to her about resources.  She told him she could help him with housing, etc. He reported he is still homeless, and has a spot behind a building that is working out for him right now.  He said he even has access to a water hose and takes a bath after dark. I assisted him getting food and supplies from the [facility] closet, which he appreciated.  [facility] near [facility], but I will supplement this whenever he comes in and I have availability. Franne FortsKenny Daryana Whirley, LCSW

## 2016-07-18 ENCOUNTER — Telehealth: Payer: Self-pay | Admitting: *Deleted

## 2016-07-18 NOTE — Telephone Encounter (Signed)
Requested records faxed to Dr. Dartha LodgeAnthony Steele - [phone].

## 2016-07-23 ENCOUNTER — Emergency Department (HOSPITAL_COMMUNITY)
Admission: EM | Admit: 2016-07-23 | Discharge: 2016-07-24 | Disposition: A | Discharge status: Home / Self Care | Payer: Self-pay | Attending: Emergency Medicine | Admitting: Emergency Medicine

## 2016-07-23 ENCOUNTER — Encounter (HOSPITAL_COMMUNITY): Payer: Self-pay | Admitting: Nurse Practitioner

## 2016-07-23 DIAGNOSIS — B20 Human immunodeficiency virus [HIV] disease: Secondary | ICD-10-CM | POA: Insufficient documentation

## 2016-07-23 DIAGNOSIS — J441 Chronic obstructive pulmonary disease with (acute) exacerbation: Secondary | ICD-10-CM | POA: Insufficient documentation

## 2016-07-23 DIAGNOSIS — J45909 Unspecified asthma, uncomplicated: Secondary | ICD-10-CM | POA: Insufficient documentation

## 2016-07-23 DIAGNOSIS — M792 Neuralgia and neuritis, unspecified: Secondary | ICD-10-CM | POA: Insufficient documentation

## 2016-07-23 DIAGNOSIS — F17210 Nicotine dependence, cigarettes, uncomplicated: Secondary | ICD-10-CM | POA: Insufficient documentation

## 2016-07-23 DIAGNOSIS — Z7951 Long term (current) use of inhaled steroids: Secondary | ICD-10-CM | POA: Insufficient documentation

## 2016-07-23 NOTE — ED Triage Notes (Signed)
Pt is presented by EMS, obvious intoxication which he endorses several alcohol drinks that he states he took together with his HIV medications. He states he has neuropathy that why he came in to be seen.

## 2016-07-23 NOTE — ED Provider Notes (Signed)
[facility]-EMERGENCY DEPT Provider Note   CSN: [id number] Arrival date & time: 07/23/16  2324  By signing my name below, I, Steven [name], attest that this documentation has been prepared under the direction and in the presence of Kjirsten Bloodgood, MD. Electronically Signed: Gillis Ends. Lyn Hollingshead, ED Scribe. 07/23/16. 12:25 AM.  History   Chief Complaint Chief Complaint  Patient presents with  . Alcohol Intoxication  . Multiple Complaints    HPI HPI Comments: Steven [name] is a 50 y.o. male brought in by ambulance, with PMHx of neuropathy, AIDS, CVA, Hemophilia and Hep C who presents to the Emergency Department complaining of constant, moderate, generalized neuropathic pain x 1 day. He states that pain is more prominent in his bilateral legs and feet. Per EMS, EtOH is on board and pt admits to drinking 3 12-oz beers PTA to "help with the pain."  He notes that pain feels like he has "20 million things going through his legs and feet." He reports recently being prescribed Neurontin om 07/18/16. Denies any SI.  The history is provided by the patient and the EMS personnel. No language interpreter was used.   Pertinent negatives include no chest pain.   Past Medical History:  Diagnosis Date  . AIDS (HCC) 1985   . Asthma   . Cerebral hemorrhage (HCC) 2000  . Hemophilia (HCC)   . Hepatitis C    never treated    Patient Active Problem List   Diagnosis Date Noted  . COPD exacerbation (HCC) 01/12/2016  . Depression 08/04/2015  . Hepatitis C 08/02/2015  . HIV disease (HCC) 07/28/2015  . Other pancytopenia (HCC) 07/28/2015  . Esophageal tear   . Gastrointestinal hemorrhage with melena   . GI bleed 07/26/2015  . Numbness on left side 07/15/2013    Past Surgical History:  Procedure Laterality Date  . ESOPHAGOGASTRODUODENOSCOPY (EGD) WITH PROPOFOL N/A 07/27/2015   Procedure: ESOPHAGOGASTRODUODENOSCOPY (EGD) WITH PROPOFOL;  Surgeon: Hilarie Fredrickson, MD;  Location: [facility] ENDOSCOPY;  Service:  Endoscopy;  Laterality: N/A;  . HERNIA REPAIR    . laporotomy    . MANDIBLE SURGERY      Home Medications    Prior to Admission medications   Medication Sig Start Date End Date Taking? Authorizing Provider  albuterol (PROVENTIL HFA;VENTOLIN HFA) 108 (90 Base) MCG/ACT inhaler Inhale 1-2 puffs into the lungs every 6 (six) hours as needed for wheezing or shortness of breath. 01/12/16   Ginnie Smart, MD  benzonatate (TESSALON) 100 MG capsule TAKE 1 CAPSULE(100 MG) BY MOUTH THREE TIMES DAILY AS NEEDED FOR COUGH 07/14/16   Ginnie Smart, MD  chlorhexidine (PERIDEX) 0.12 % solution Use as directed 10 mLs in the mouth or throat 4 (four) times daily. Swish and spit 06/15/16   Derwood Kaplan, MD  clonazePAM (KLONOPIN) 1 MG tablet Take 0.5 tablets (0.5 mg total) by mouth 2 (two) times daily as needed for anxiety. 03/01/16   Ginnie Smart, MD  diphenhydrAMINE (BENADRYL) 25 mg capsule Take 25 mg by mouth every 6 (six) hours as needed for allergies.    Historical Provider, MD  dolutegravir (TIVICAY) 50 MG tablet Take 1 tablet (50 mg total) by mouth daily. 01/12/16   Ginnie Smart, MD  dronabinol (MARINOL) 2.5 MG capsule TAKE ONE CAPSULE BY MOUTH TWICE DAILY BEFORE A MEAL 05/09/16   Ginnie Smart, MD  Elbasvir-Grazoprevir (ZEPATIER) 50-100 MG TABS Take 1 tablet by mouth daily. 01/17/16   Ginnie Smart, MD  emtricitabine-tenofovir AF (DESCOVY) 200-25 MG  tablet Take 1 tablet by mouth daily. 11/19/15   Randall Hissornelius N Van Dam, MD  hydroxypropyl methylcellulose (ISOPTO TEARS) 2.5 % ophthalmic solution Place 1 drop into both eyes 3 (three) times daily as needed for dry eyes.    Historical Provider, MD  mometasone-formoterol (DULERA) 100-5 MCG/ACT AERO Inhale 2 puffs into the lungs 2 (two) times daily. 06/01/16   Ginnie SmartJeffrey C Hatcher, MD  Multiple Vitamin (MULTIVITAMIN WITH MINERALS) TABS tablet Take 1 tablet by mouth daily.    Historical Provider, MD  oxyCODONE (ROXICODONE) 5 MG immediate release tablet  Take 1 tablet (5 mg total) by mouth every 6 (six) hours as needed for severe pain. Patient not taking: Reported on 01/12/2016 07/28/15   Leatha Gildingostin M Gherghe, MD  predniSONE (STERAPRED UNI-PAK 21 TAB) 10 MG (21) TBPK tablet Take 1 tablet (10 mg total) by mouth daily. 4 tabs on day 1 and 2, 3 tabs on day 3 and 4, 2 tabs on day 5 and 6, 1 tab on days 7-9 01/12/16   Ginnie SmartJeffrey C Hatcher, MD  sertraline (ZOLOFT) 100 MG tablet Take 1 tablet (100 mg total) by mouth daily. 06/13/16   Ginnie SmartJeffrey C Hatcher, MD  sulfamethoxazole-trimethoprim (BACTRIM DS,SEPTRA DS) 800-160 MG tablet TAKE 1 TABLET BY MOUTH DAILY 06/05/16   Ginnie SmartJeffrey C Hatcher, MD    Family History Family History  Problem Relation Age of Onset  . CAD Mother   . Stroke Mother     Social History Social History  Substance Use Topics  . Smoking status: Current Every Day Smoker    Packs/day: 2.00    Years: 38.00    Types: Cigarettes  . Smokeless tobacco: Never Used  . Alcohol use 0.0 oz/week     Comment: 6 pack per week per patient    Allergies   Aspirin  Review of Systems Review of Systems  Cardiovascular: Negative for chest pain.  Musculoskeletal: Positive for myalgias.  Psychiatric/Behavioral: Negative for suicidal ideas.  All other systems reviewed and are negative.   Physical Exam Updated Vital Signs BP 154/90 (BP Location: Left Arm)   Pulse 81   Temp 98.1 F (36.7 C) (Oral)   Resp 18   SpO2 98%   Physical Exam  Constitutional: He is oriented to person, place, and time. He appears well-developed and well-nourished. No distress.  HENT:  Head: Normocephalic and atraumatic.  Mouth/Throat: Oropharynx is clear and moist. No oropharyngeal exudate.  Moist mucous membranes   Eyes: Conjunctivae are normal. Pupils are equal, round, and reactive to light.  Neck: Normal range of motion. Neck supple. No JVD present.  Trachea midline No bruit  Cardiovascular: Normal rate, regular rhythm and normal heart sounds.   Pulmonary/Chest: Effort  normal and breath sounds normal. No stridor. No respiratory distress.  Abdominal: Soft. Bowel sounds are normal. He exhibits no distension.  Neurological: He is alert and oriented to person, place, and time. He has normal reflexes.  Skin: Skin is warm and dry. Capillary refill takes less than 2 seconds.  Psychiatric: He has a normal mood and affect. His behavior is normal.  Nursing note and vitals reviewed.  ED Treatments / Results  DIAGNOSTIC STUDIES: Oxygen Saturation is 98% on RA, normal by my interpretation.    COORDINATION OF CARE: 12:10 AM-Discussed treatment plan which includes order of Mag-Ox and Voltaren with pt at bedside and pt agreed to plan.   Labs (all labs ordered are listed, but only abnormal results are displayed) Labs Reviewed - No data to display   Procedures  Procedures (including critical care time)  Medications Ordered in ED Medications  magnesium oxide (MAG-OX) tablet 200 mg (200 mg Oral Given 07/24/16 0030)  diclofenac (VOLTAREN) EC tablet 75 mg (75 mg Oral Given 07/24/16 0030)    Initial Impression / Assessment and Plan / ED Course  I have reviewed the triage vital signs and the nursing notes.  Pertinent labs & imaging results that were available during my care of the patient were reviewed by me and considered in my medical decision making (see chart for details).  Clinical Course   Vitals:   07/23/16 2341 07/24/16 0112  BP: 154/90 137/96  Pulse: 81 89  Resp: 18 14  Temp: 98.1 F (36.7 C)    Medications  magnesium oxide (MAG-OX) tablet 200 mg (200 mg Oral Given 07/24/16 0030)  diclofenac (VOLTAREN) EC tablet 75 mg (75 mg Oral Given 07/24/16 0030)     Final Clinical Impressions(s) / ED Diagnoses   Final diagnoses:  None    New Prescriptions New Prescriptions   No medications on file  Continue Neurontin.  Follow up with your doctors for titration of this medication.  All questions answered to patient's satisfaction. Based on history and exam  patient has been appropriately medically screened and emergency conditions excluded. Patient is stable for discharge at this time. Follow up with your PMDfor recheck in 2 daysand strict return precautions given. I personally performed the services described in this documentation, which was scribed in my presence. The recorded information has been reviewed and is accurate.       Cy BlamerApril Manasvini Whatley, MD 07/24/16 [phone]

## 2016-07-23 NOTE — ED Notes (Signed)
[facility] Expected date:  Expected time:  Means of arrival:  Comments: EMS 50yo M Neuropathy pain x 5 days

## 2016-07-24 ENCOUNTER — Encounter (HOSPITAL_COMMUNITY): Payer: Self-pay | Admitting: Emergency Medicine

## 2016-07-24 MED ORDER — DICLOFENAC SODIUM 75 MG PO TBEC
75.0000 mg | DELAYED_RELEASE_TABLET | Freq: Two times a day (BID) | ORAL | Status: DC
  Administered 2016-07-24: 75 mg via ORAL
  Filled 2016-07-24: qty 1

## 2016-07-24 MED ORDER — DICLOFENAC SODIUM 1 % TD GEL: 2.0000 g | Freq: Four times a day (QID) | TRANSDERMAL | 0 refills | Status: DC

## 2016-07-24 MED ORDER — MAGNESIUM OXIDE 400 (241.3 MG) MG PO TABS
200.0000 mg | ORAL_TABLET | Freq: Every day | ORAL | Status: DC
  Administered 2016-07-24: 200 mg via ORAL
  Filled 2016-07-24: qty 0.5

## 2016-07-30 ENCOUNTER — Other Ambulatory Visit: Payer: Self-pay | Admitting: Infectious Diseases

## 2016-07-30 DIAGNOSIS — F329 Major depressive disorder, single episode, unspecified: Secondary | ICD-10-CM

## 2016-07-30 DIAGNOSIS — F32A Depression, unspecified: Secondary | ICD-10-CM

## 2016-07-31 IMAGING — DX DG HIP (WITH OR WITHOUT PELVIS) 2-3V*L*
3 series · 3 of 3 positions shown · non-contrast
Comparison: None.

CLINICAL DATA: LEFT hip pain for 1 week. Bruising on the proximal
lateral femur. No known injury.

EXAM:
DG HIP (WITH OR WITHOUT PELVIS) 2-3V LEFT

[pelvis ap]
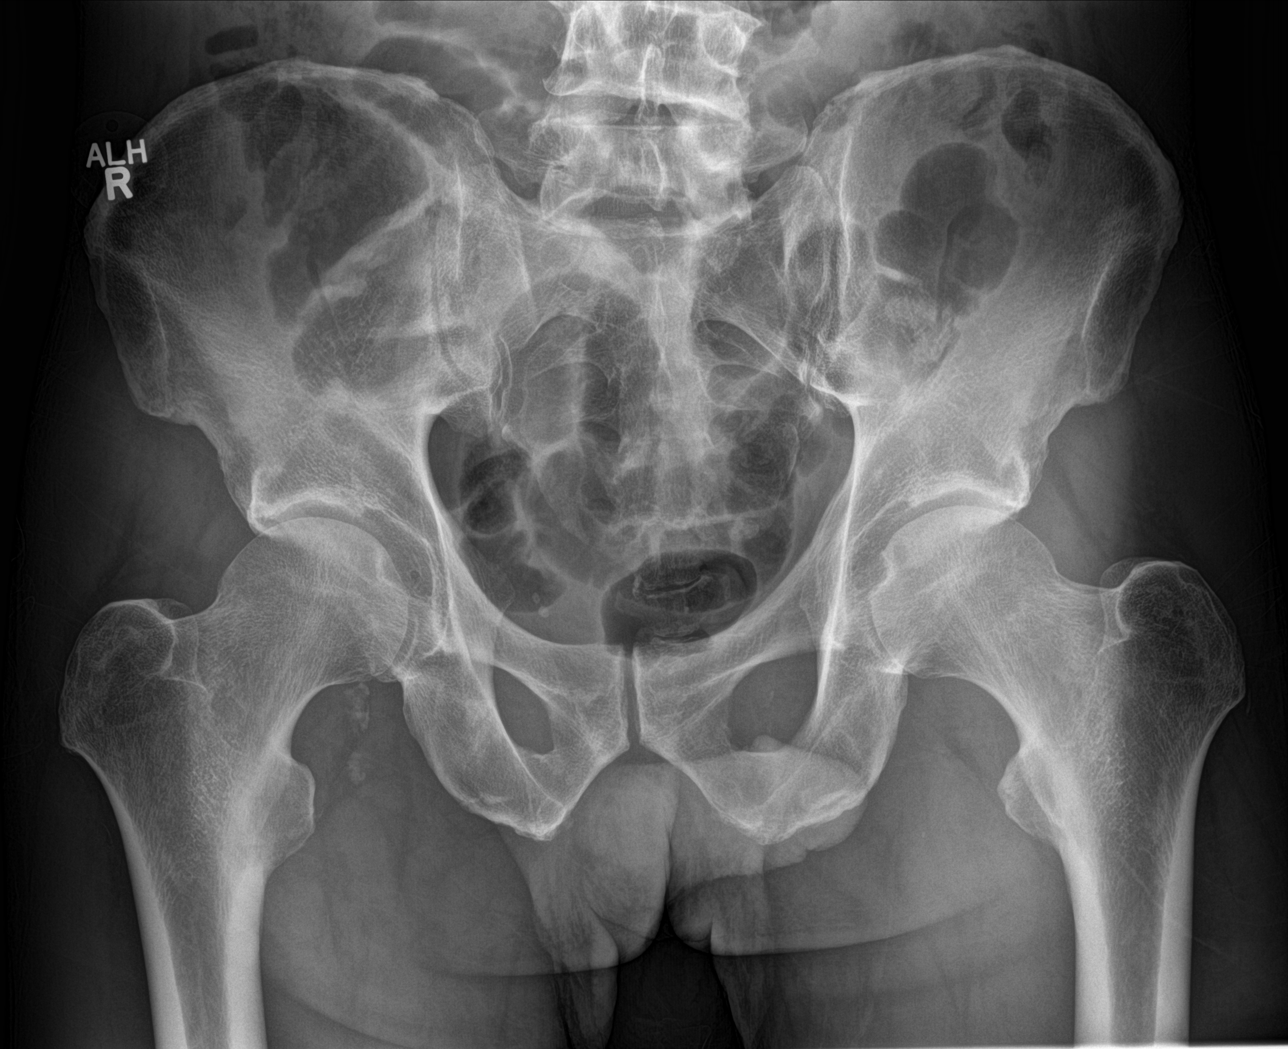

[hip ap]
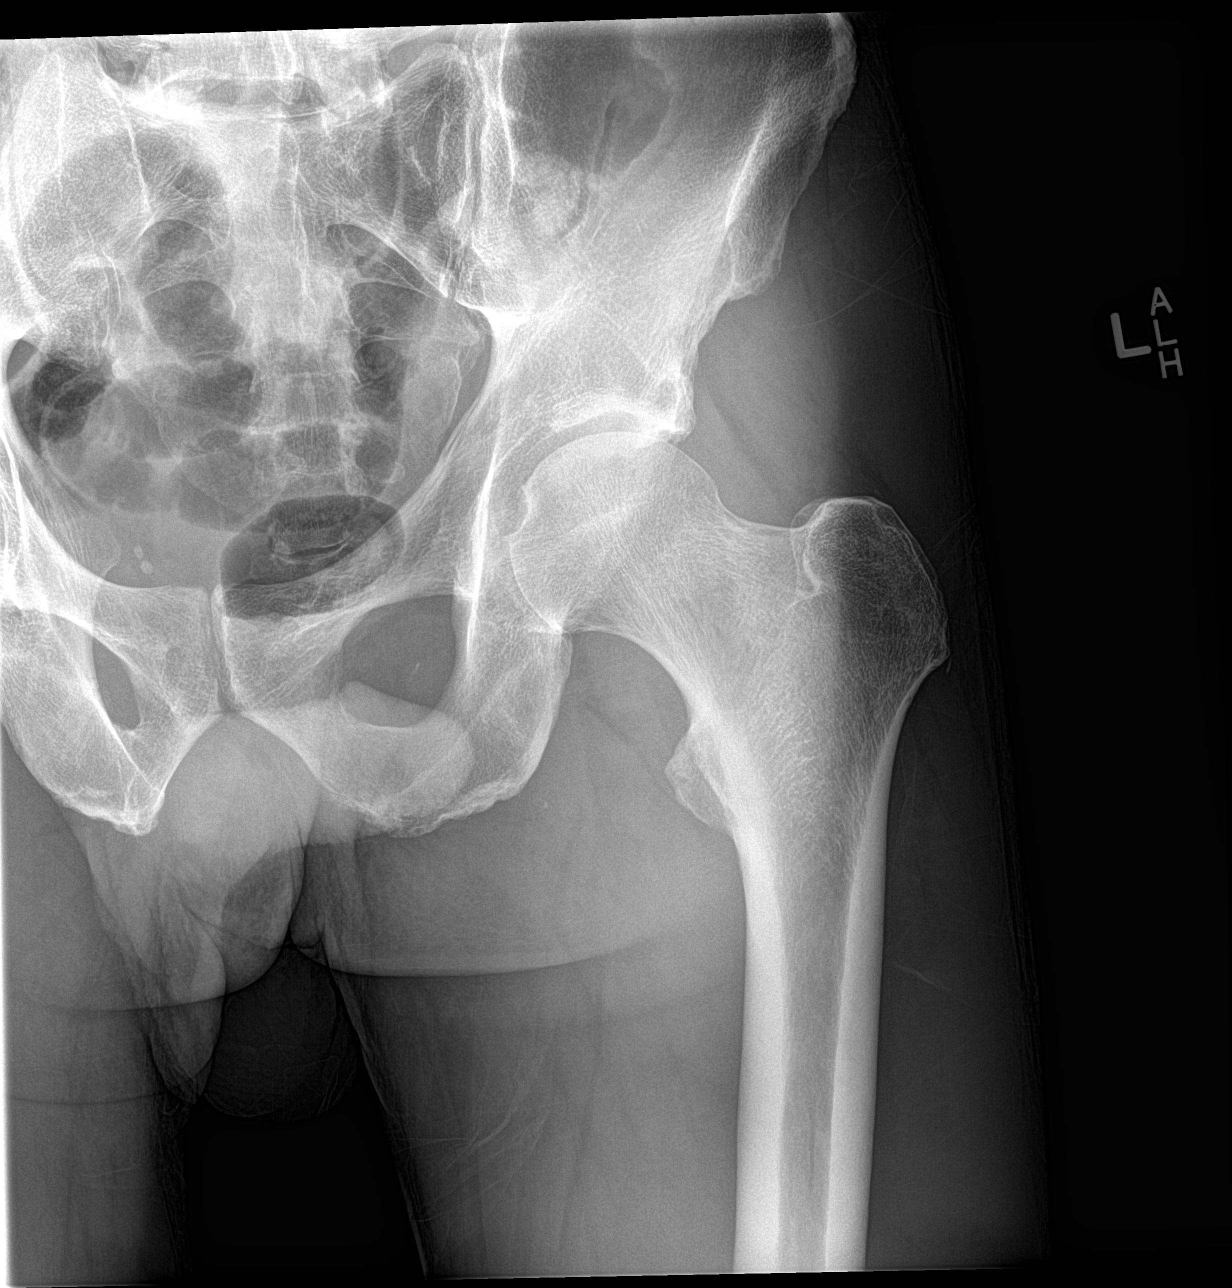

[hip lat]
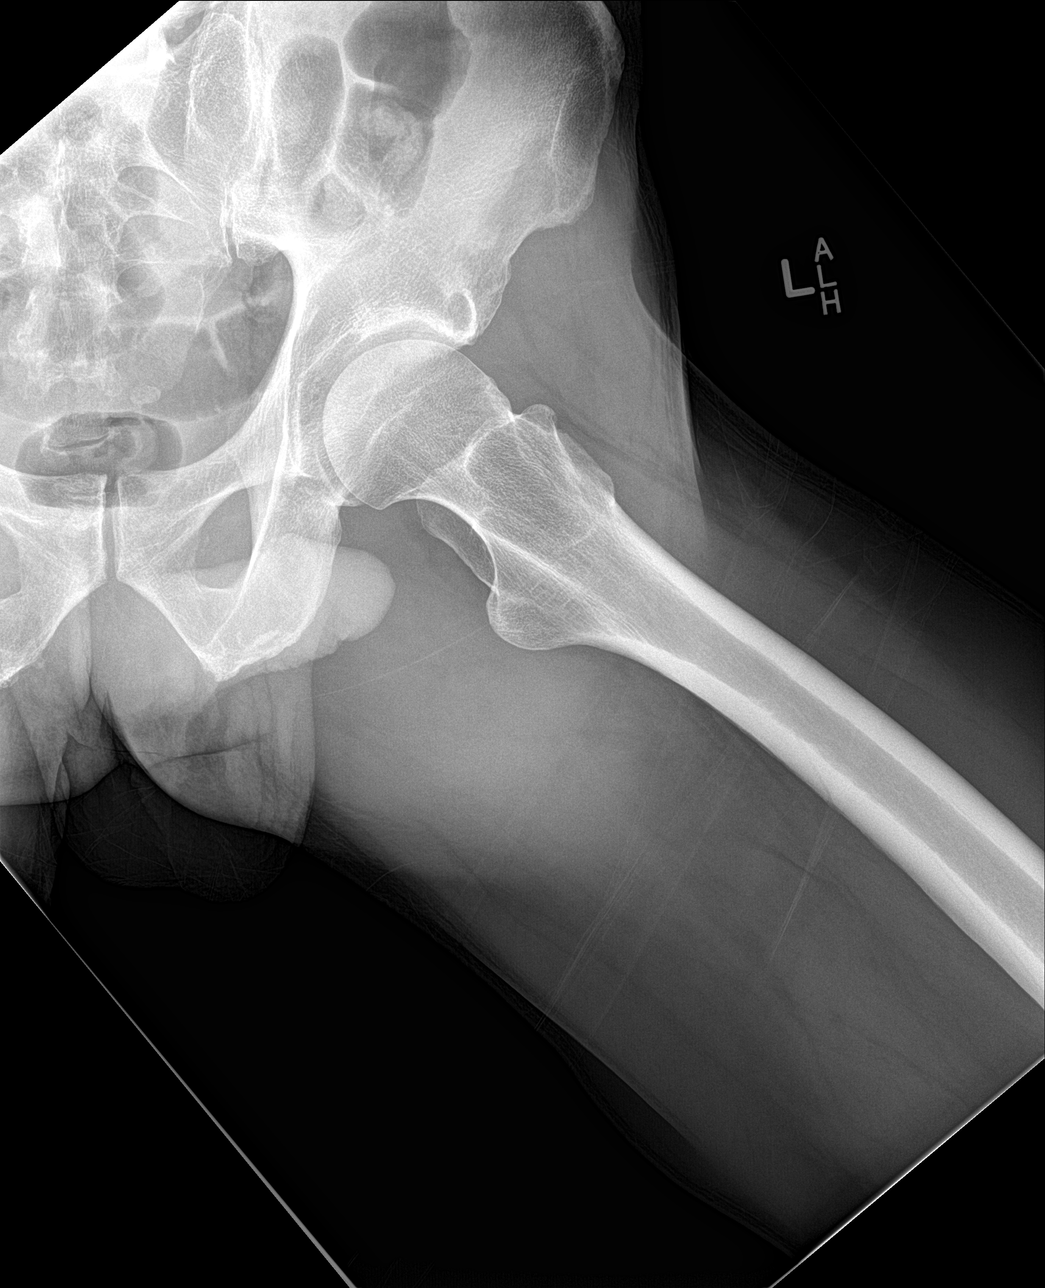

[3 of 3 positions shown; findings below may reference images not displayed]

FINDINGS: Pelvic rings are intact. Atherosclerotic calcifications are present
in the region of the RIGHT femoral artery. The obturator rings
appear intact. Lumbar spondylosis is present with asymmetric
LEFT-sided L4-L5 disc space collapse. SI joints appear normal. LEFT
hip appears normal.
IMPRESSION: Negative.

## 2016-08-03 ENCOUNTER — Ambulatory Visit: Payer: Self-pay

## 2016-08-20 ENCOUNTER — Other Ambulatory Visit: Payer: Self-pay | Admitting: Infectious Diseases

## 2016-08-20 DIAGNOSIS — B20 Human immunodeficiency virus [HIV] disease: Secondary | ICD-10-CM

## 2016-08-24 ENCOUNTER — Other Ambulatory Visit: Payer: Self-pay

## 2016-08-29 ENCOUNTER — Other Ambulatory Visit (INDEPENDENT_AMBULATORY_CARE_PROVIDER_SITE_OTHER): Payer: Self-pay

## 2016-08-29 ENCOUNTER — Ambulatory Visit (INDEPENDENT_AMBULATORY_CARE_PROVIDER_SITE_OTHER): Payer: Self-pay

## 2016-08-29 DIAGNOSIS — Z21 Asymptomatic human immunodeficiency virus [HIV] infection status: Secondary | ICD-10-CM

## 2016-08-29 DIAGNOSIS — Z23 Encounter for immunization: Secondary | ICD-10-CM

## 2016-08-29 DIAGNOSIS — B20 Human immunodeficiency virus [HIV] disease: Secondary | ICD-10-CM

## 2016-08-29 LAB — CBC WITH DIFFERENTIAL/PLATELET
BASOS ABS: 0 {cells}/uL (ref 0–200)
Basophils Relative: 0 %
EOS PCT: 5 %
Eosinophils Absolute: 365 cells/uL (ref 15–500)
HCT: 37.6 % — ABNORMAL LOW (ref 38.5–50.0)
Hemoglobin: 12.8 g/dL — ABNORMAL LOW (ref 13.2–17.1)
Lymphocytes Relative: 26 %
Lymphs Abs: 1898 cells/uL (ref 850–3900)
MCH: 31.5 pg (ref 27.0–33.0)
MCHC: 34 g/dL (ref 32.0–36.0)
MCV: 92.6 fL (ref 80.0–100.0)
MONOS PCT: 12 %
MPV: 9.6 fL (ref 7.5–12.5)
Monocytes Absolute: 876 cells/uL (ref 200–950)
NEUTROS ABS: 4161 {cells}/uL (ref 1500–7800)
NEUTROS PCT: 57 %
PLATELETS: 150 10*3/uL (ref 140–400)
RBC: 4.06 MIL/uL — ABNORMAL LOW (ref 4.20–5.80)
RDW: 13.4 % (ref 11.0–15.0)
WBC: 7.3 10*3/uL (ref 3.8–10.8)

## 2016-09-09 ENCOUNTER — Emergency Department (HOSPITAL_COMMUNITY)
Admission: EM | Admit: 2016-09-09 | Discharge: 2016-09-09 | Disposition: A | Discharge status: Home / Self Care | Payer: Self-pay | Attending: Dermatology | Admitting: Dermatology

## 2016-09-09 ENCOUNTER — Encounter (HOSPITAL_COMMUNITY): Payer: Self-pay | Admitting: Oncology

## 2016-09-09 DIAGNOSIS — F10120 Alcohol abuse with intoxication, uncomplicated: Secondary | ICD-10-CM | POA: Insufficient documentation

## 2016-09-09 DIAGNOSIS — Z5321 Procedure and treatment not carried out due to patient leaving prior to being seen by health care provider: Secondary | ICD-10-CM | POA: Insufficient documentation
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
## 2016-09-11 IMAGING — CR DG ABDOMEN ACUTE W/ 1V CHEST
3 series · 3 of 3 positions shown · non-contrast
Comparison: Chest radiograph 06/15/2015

CLINICAL DATA: GI bleed, vomiting, history HIV/AIDS, hepatitis-C,
asthma, smoker

EXAM:
DG ABDOMEN ACUTE W/ 1V CHEST

[w chest pa]
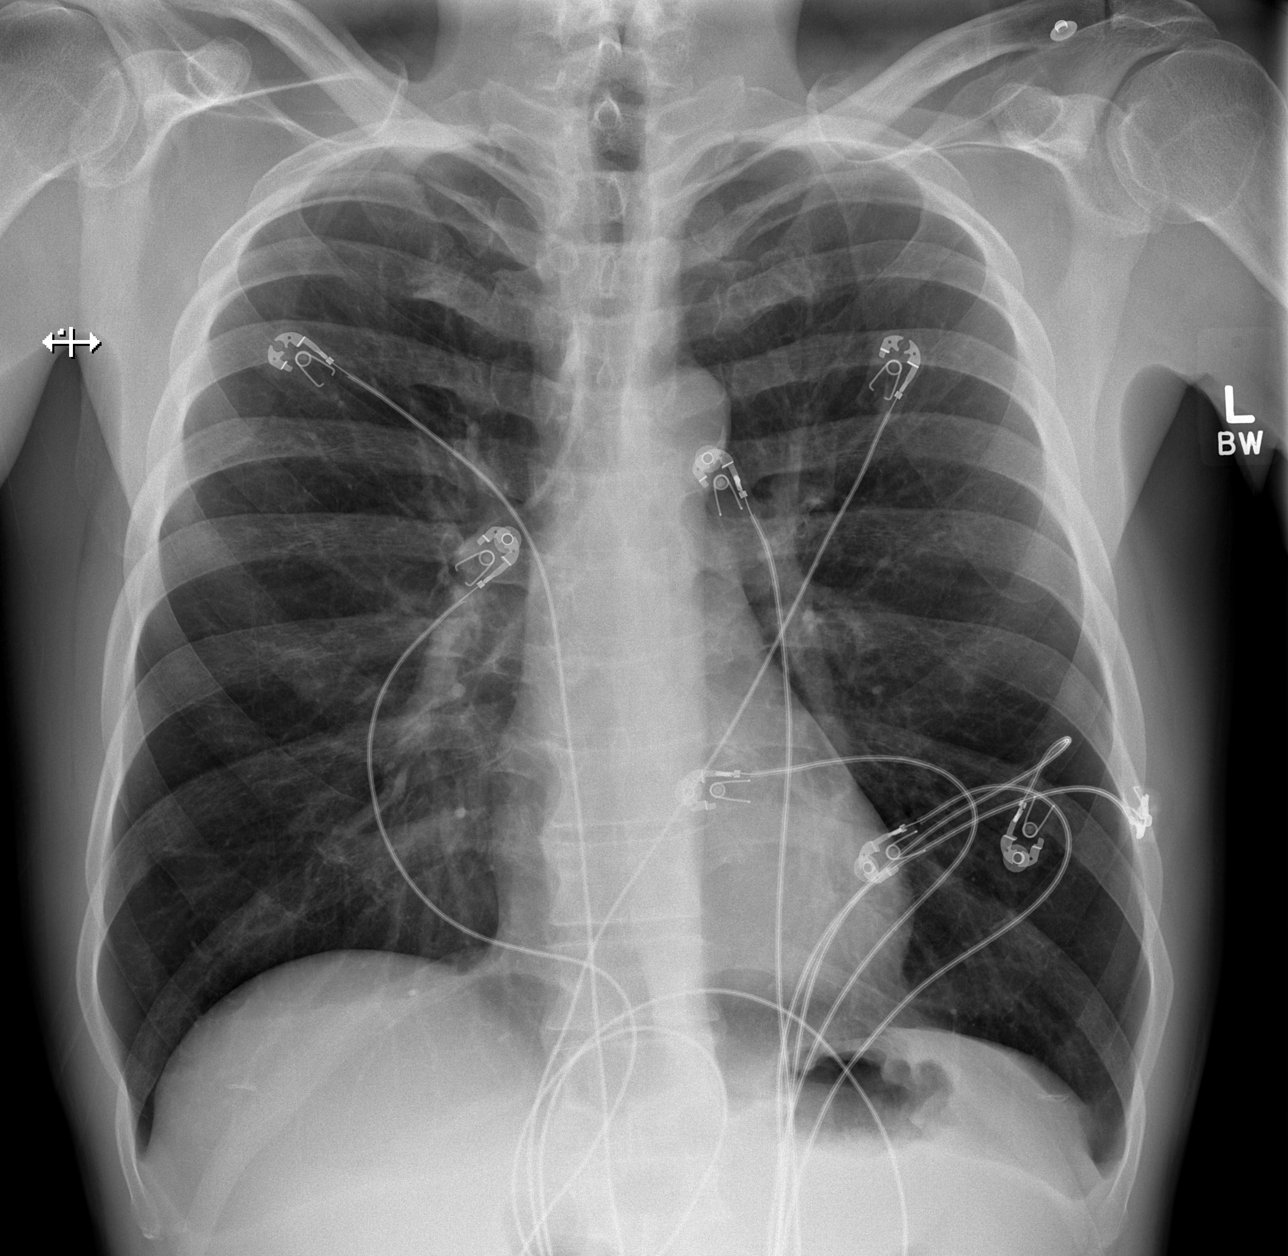

[w abdomen upright]
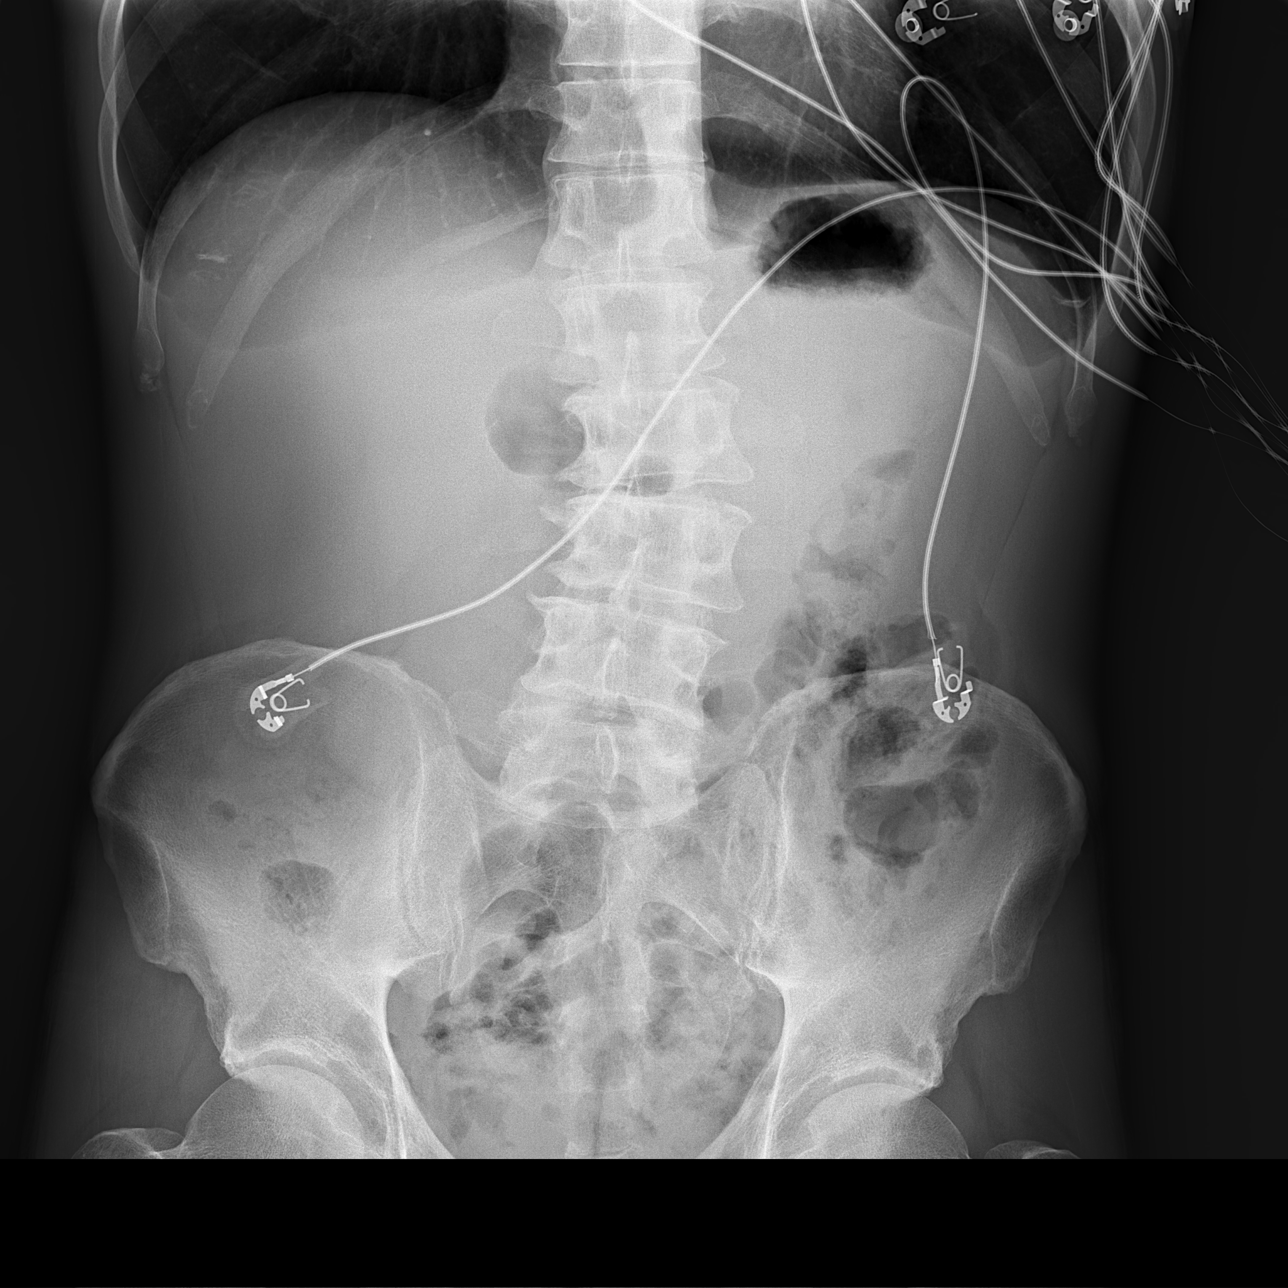

[t abdomen supine]
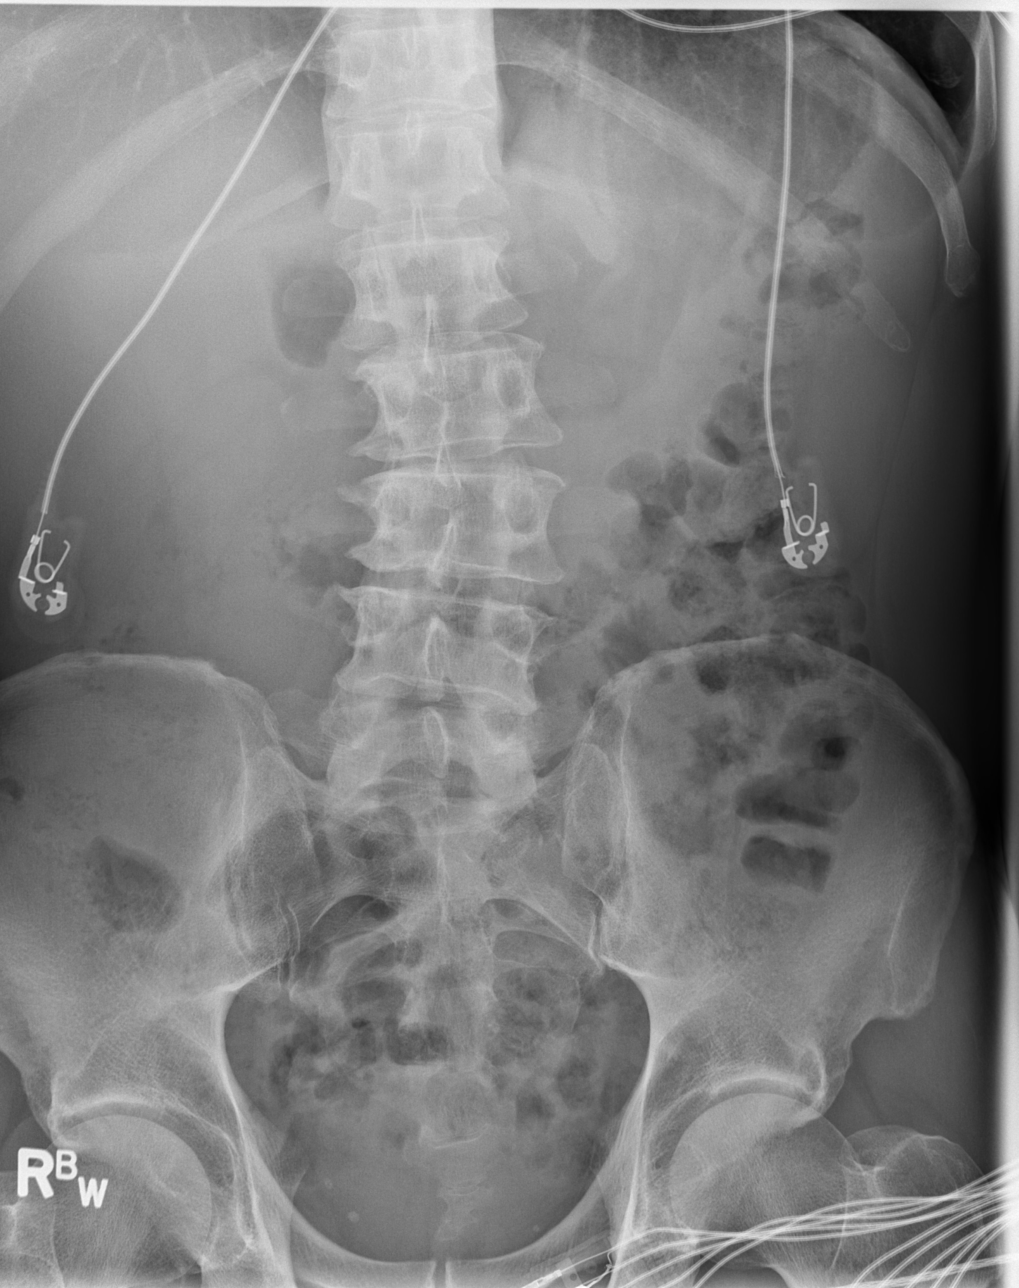

[3 of 3 positions shown; findings below may reference images not displayed]

FINDINGS: Normal heart size, mediastinal contours and pulmonary vascularity.

Atherosclerotic calcification aorta.

Lungs appear hyperaerated with minimal central peribronchial
thickening.

No infiltrate, pleural effusion or pneumothorax.

Nonobstructive bowel gas pattern.

No bowel dilatation, bowel wall thickening or free air.

Small pelvic phleboliths.

Levoconvex lumbar scoliosis without acute osseous abnormalities.

No urinary tract calcification.
IMPRESSION: Hyperaeration and minimal peribronchial thickening question
bronchitis.

Normal bowel gas pattern.

## 2016-09-13 ENCOUNTER — Ambulatory Visit (INDEPENDENT_AMBULATORY_CARE_PROVIDER_SITE_OTHER): Payer: Self-pay | Admitting: Infectious Diseases

## 2016-09-13 ENCOUNTER — Encounter: Payer: Self-pay | Admitting: Infectious Diseases

## 2016-09-13 VITALS — Temp 98.2°F | Ht 71.0 in | Wt 193.0 lb

## 2016-09-13 DIAGNOSIS — B20 Human immunodeficiency virus [HIV] disease: Secondary | ICD-10-CM

## 2016-09-13 DIAGNOSIS — Z79899 Other long term (current) drug therapy: Secondary | ICD-10-CM

## 2016-09-13 DIAGNOSIS — B182 Chronic viral hepatitis C: Secondary | ICD-10-CM

## 2016-09-13 DIAGNOSIS — Z113 Encounter for screening for infections with a predominantly sexual mode of transmission: Secondary | ICD-10-CM

## 2016-09-13 LAB — COMPREHENSIVE METABOLIC PANEL
ALBUMIN: 4.4 g/dL (ref 3.6–5.1)
ALT: 17 U/L (ref 9–46)
AST: 26 U/L (ref 10–35)
Alkaline Phosphatase: 48 U/L (ref 40–115)
BUN: 14 mg/dL (ref 7–25)
CALCIUM: 9.1 mg/dL (ref 8.6–10.3)
CHLORIDE: 100 mmol/L (ref 98–110)
CO2: 19 mmol/L — ABNORMAL LOW (ref 20–31)
Creat: 1.17 mg/dL (ref 0.70–1.33)
Glucose, Bld: 73 mg/dL (ref 65–99)
POTASSIUM: 4 mmol/L (ref 3.5–5.3)
Sodium: 132 mmol/L — ABNORMAL LOW (ref 135–146)
TOTAL PROTEIN: 8.3 g/dL — AB (ref 6.1–8.1)
Total Bilirubin: 0.5 mg/dL (ref 0.2–1.2)

## 2016-09-13 LAB — LIPID PANEL
CHOL/HDL RATIO: 2.3 ratio (ref <=5.0)
CHOLESTEROL: 116 mg/dL — AB (ref 125–200)
HDL: 51 mg/dL (ref >=40)
LDL Cholesterol: 55 mg/dL (ref <130)
TRIGLYCERIDES: 52 mg/dL (ref <150)
VLDL: 10 mg/dL (ref <30)

## 2016-09-13 NOTE — Assessment & Plan Note (Signed)
Will check his labs today Cholesterol, STDs Has gotten flu shot.  Will see him back in 3 months.

## 2016-09-13 NOTE — Addendum Note (Signed)
Addended by: Mariea ClontsGREEN, Ahaana Rochette D on: 09/13/2016 02:35 PM   Modules accepted: Orders

## 2016-09-13 NOTE — Assessment & Plan Note (Addendum)
Will defer his tx til he is more reliable.  Not sure that he smells of ETOH today.  Restart his Hep B series

## 2016-09-13 NOTE — Progress Notes (Signed)
   Subjective:    Patient ID: Steven [name], male    DOB: 02/25/1966, 50 y.o.   MRN: [id number]  HPI 50 yo M with hx of HIV+ since (previously on DTVr/TRV), [facility]. Has been positive 1985. CVA 2010.  Had CD4 200 (03-2015).   He had anal pap 06-2011 which showed ASCUS.  Was treated shingles. [facility].  [facility], mild esophageal tear.  Hep C 1a  Not seen in ID since 01-2016  Has been seeing Bernette RedbirdKenny, has been seeing psychiatry Valinda Hoar(Meredith Baker) as well. Has PCP (Dr Ethelene BrownsAnthony Still).  Newly dx with HTN.  Has lost his father this year and lost his home. Is being eval by court for hearing for disability.   HIV 1 RNA Quant (copies/mL)  Date Value  05/31/2016 56,483 (H)  01/12/2016 29,970 (H)  12/21/2015 26,140 (H)   CD4 T Cell Abs (/uL)  Date Value  05/31/2016 200 (L)  12/21/2015 100 (L)  10/13/2015 80 (L)   Wants accurate medication list as he has been stopped by police and wants to have this as back up.  Has been taking marinol- has gained 31#.   Review of Systems  Constitutional: Negative for appetite change and unexpected weight change.  Cardiovascular: Negative for chest pain and palpitations.  Gastrointestinal: Negative for constipation and diarrhea.  Genitourinary: Negative for difficulty urinating.  Psychiatric/Behavioral: The patient is nervous/anxious.        Objective:   Physical Exam  Constitutional: He appears well-developed and well-nourished.  HENT:  Mouth/Throat: No oropharyngeal exudate.  Eyes: EOM are normal. Pupils are equal, round, and reactive to light.  Neck: Neck supple.  Cardiovascular: Normal rate, regular rhythm and normal heart sounds.   Pulmonary/Chest: Effort normal and breath sounds normal.  Abdominal: Soft. Bowel sounds are normal. There is no rebound.  Lymphadenopathy:    He has no cervical adenopathy.      Assessment & Plan:

## 2016-09-13 NOTE — Addendum Note (Signed)
Addended by: Andree CossHOWELL, Lucca Ballo M on: 09/13/2016 10:26 AM   Modules accepted: Orders

## 2016-09-14 LAB — T-HELPER CELL (CD4) - (RCID CLINIC ONLY)
CD4 % Helper T Cell: 12 % — ABNORMAL LOW (ref 33–55)
CD4 T Cell Abs: 170 /uL — ABNORMAL LOW (ref 400–2700)

## 2016-09-14 LAB — RPR: RPR Ser Ql: REACTIVE — AB

## 2016-09-14 LAB — FLUORESCENT TREPONEMAL AB(FTA)-IGG-BLD: Fluorescent Treponemal ABS: REACTIVE — AB

## 2016-09-14 LAB — RPR TITER

## 2016-09-15 LAB — HIV-1 RNA ULTRAQUANT REFLEX TO GENTYP+

## 2016-09-21 LAB — HIV-1 INTEGRASE GENOTYPE

## 2016-09-26 ENCOUNTER — Other Ambulatory Visit: Payer: Self-pay | Admitting: *Deleted

## 2016-09-26 DIAGNOSIS — Z21 Asymptomatic human immunodeficiency virus [HIV] infection status: Secondary | ICD-10-CM

## 2016-09-26 MED ORDER — DRONABINOL 2.5 MG PO CAPS: ORAL_CAPSULE | ORAL | 4 refills | Status: DC

## 2016-09-27 ENCOUNTER — Telehealth: Payer: Self-pay | Admitting: *Deleted

## 2016-09-27 ENCOUNTER — Ambulatory Visit: Payer: Self-pay

## 2016-09-27 NOTE — Telephone Encounter (Addendum)
Patient called regarding his refill of Marinol. The pharmacist stated that his ADAP is expired and when I checked with Olegario MessierKathy, financial assistance, she said he never renewed at the end of September. Advised patient and he made an appt for today to fill out his ADAP.

## 2016-09-28 ENCOUNTER — Other Ambulatory Visit: Payer: Self-pay | Admitting: *Deleted

## 2016-09-28 DIAGNOSIS — B20 Human immunodeficiency virus [HIV] disease: Secondary | ICD-10-CM

## 2016-09-28 MED ORDER — PREZCOBIX 800-150 MG PO TABS: 1.0000 | ORAL_TABLET | Freq: Every day | ORAL | 3 refills | Status: DC

## 2016-09-28 MED ORDER — EMTRICITABINE-TENOFOVIR AF 200-25 MG PO TABS: 1.0000 | ORAL_TABLET | Freq: Every day | ORAL | 3 refills | Status: DC

## 2016-09-28 MED ORDER — DOLUTEGRAVIR SODIUM 50 MG PO TABS: 50.0000 mg | ORAL_TABLET | Freq: Every day | ORAL | 3 refills | Status: DC

## 2016-09-29 ENCOUNTER — Encounter: Payer: Self-pay | Admitting: Infectious Diseases

## 2016-10-25 ENCOUNTER — Other Ambulatory Visit: Payer: Self-pay | Admitting: Infectious Diseases

## 2016-10-25 DIAGNOSIS — B20 Human immunodeficiency virus [HIV] disease: Secondary | ICD-10-CM

## 2016-11-11 ENCOUNTER — Other Ambulatory Visit: Payer: Self-pay | Admitting: Infectious Diseases

## 2016-11-11 DIAGNOSIS — B20 Human immunodeficiency virus [HIV] disease: Secondary | ICD-10-CM

## 2016-11-15 ENCOUNTER — Encounter (HOSPITAL_COMMUNITY): Payer: Self-pay | Admitting: *Deleted

## 2016-11-15 ENCOUNTER — Emergency Department (HOSPITAL_COMMUNITY)
Admission: EM | Admit: 2016-11-15 | Discharge: 2016-11-15 | Disposition: A | Discharge status: Home / Self Care | Payer: Self-pay | Attending: Emergency Medicine | Admitting: Emergency Medicine

## 2016-11-15 DIAGNOSIS — I10 Essential (primary) hypertension: Secondary | ICD-10-CM | POA: Insufficient documentation

## 2016-11-15 DIAGNOSIS — F17210 Nicotine dependence, cigarettes, uncomplicated: Secondary | ICD-10-CM | POA: Insufficient documentation

## 2016-11-15 DIAGNOSIS — Z760 Encounter for issue of repeat prescription: Secondary | ICD-10-CM | POA: Insufficient documentation

## 2016-11-15 DIAGNOSIS — J449 Chronic obstructive pulmonary disease, unspecified: Secondary | ICD-10-CM | POA: Insufficient documentation

## 2016-11-15 MED ORDER — ALBUTEROL SULFATE HFA 108 (90 BASE) MCG/ACT IN AERS
2.0000 | INHALATION_SPRAY | Freq: Four times a day (QID) | RESPIRATORY_TRACT | Status: DC
  Administered 2016-11-15: 2 via RESPIRATORY_TRACT
  Filled 2016-11-15: qty 6.7

## 2016-11-15 MED ORDER — OXYCODONE-ACETAMINOPHEN 5-325 MG PO TABS
1.0000 | ORAL_TABLET | Freq: Once | ORAL | Status: AC
  Administered 2016-11-15: 1 via ORAL
  Filled 2016-11-15: qty 1

## 2016-11-15 NOTE — ED Triage Notes (Signed)
Pt has several outstanding prescriptions to pick up tomorrow. Pt requesting a new albuterol inhaler, a dose of blood pressure medication, and oxycodone to make it until he can pick up his prescription medications tomorrow

## 2016-11-15 NOTE — Discharge Instructions (Signed)
Return here as needed. °

## 2016-11-15 NOTE — ED Provider Notes (Signed)
[facility]-EMER[facility]Y DEPT Provider Note   CSN: [id number] Arrival date & time: 11/15/16  2134   By signing my name below, I, Steven [name], attest that this documentation has been prepared under the direction and in the presence of Eli Lilly and CompanyChristopher Shameria Trimarco, PA-C. Electronically Signed: Freida Busmaniana [name], Scribe. 11/15/2016. 10:46 PM.  History   Chief Complaint Chief Complaint  Patient presents with  . Medication Refill  . Hypertension    The history is provided by the patient. No language interpreter was used.     HPI Comments:  Steven [name] is a 50 y.o. male with a history of AIDS, asthma, Hep C, and hemophilia, 6who presents to the Emergency Department for medication refill. He is out of his albuterol inhaler, HTN med, and oxycodone. He states he would  like a dose of the oxycodone and HTN med in the ED and to be discharged with an inhaler. He states that he will be picking up the rest of his chronic meds tomorrow. At this time pt has no acute complaints or symptoms   Past Medical History:  Diagnosis Date  . AIDS (HCC) 1985   . Asthma   . Cerebral hemorrhage (HCC) 2000  . Hemophilia (HCC)   . Hepatitis C    never treated    Patient Active Problem List   Diagnosis Date Noted  . COPD exacerbation (HCC) 01/12/2016  . Depression 08/04/2015  . Hepatitis C 08/02/2015  . HIV disease (HCC) 07/28/2015  . Other pancytopenia (HCC) 07/28/2015  . Esophageal tear   . Gastrointestinal hemorrhage with melena   . GI bleed 07/26/2015  . Numbness on left side 07/15/2013    Past Surgical History:  Procedure Laterality Date  . ESOPHAGOGASTRODUODENOSCOPY (EGD) WITH PROPOFOL N/A 07/27/2015   Procedure: ESOPHAGOGASTRODUODENOSCOPY (EGD) WITH PROPOFOL;  Surgeon: Hilarie FredricksonJohn N Perry, MD;  Location: [facility];  Service: Endoscopy;  Laterality: N/A;  . HERNIA REPAIR    . laporotomy    . MANDIBLE SURGERY         Home Medications    Prior to Admission medications   Medication Sig Start Date End  Date Taking? Authorizing Provider  albuterol (PROVENTIL HFA;VENTOLIN HFA) 108 (90 Base) MCG/ACT inhaler Inhale 1-2 puffs into the lungs every 6 (six) hours as needed for wheezing or shortness of breath. 01/12/16   Ginnie SmartJeffrey C Hatcher, MD  benzonatate (TESSALON) 100 MG capsule TAKE 1 CAPSULE(100 MG) BY MOUTH THREE TIMES DAILY AS NEEDED FOR COUGH 07/14/16   Ginnie SmartJeffrey C Hatcher, MD  chlorhexidine (PERIDEX) 0.12 % solution Use as directed 10 mLs in the mouth or throat 4 (four) times daily. Swish and spit 06/15/16   Derwood KaplanAnkit Nanavati, MD  DESCOVY 200-25 MG tablet TAKE 1 TABLET BY MOUTH EVERY DAY 10/25/16   Ginnie SmartJeffrey C Hatcher, MD  diclofenac sodium (VOLTAREN) 1 % GEL Apply 2 g topically 4 (four) times daily. 07/24/16   April Palumbo, MD  diphenhydrAMINE (BENADRYL) 25 mg capsule Take 25 mg by mouth every 6 (six) hours as needed for allergies.    Historical Provider, MD  dolutegravir (TIVICAY) 50 MG tablet Take 1 tablet (50 mg total) by mouth daily. 09/28/16   Gardiner Barefootobert W Comer, MD  dronabinol (MARINOL) 2.5 MG capsule TAKE ONE CAPSULE BY MOUTH TWICE DAILY BEFORE A MEAL 09/26/16   Ginnie SmartJeffrey C Hatcher, MD  DULoxetine (CYMBALTA) 30 MG capsule Take 30 mg by mouth daily. Will go up to 60mg  after 30 days    Historical Provider, MD  gabapentin (NEURONTIN) 300 MG capsule Take 300 mg by  mouth 3 (three) times daily.    Historical Provider, MD  hydroxypropyl methylcellulose (ISOPTO TEARS) 2.5 % ophthalmic solution Place 1 drop into both eyes 3 (three) times daily as needed for dry eyes.    Historical Provider, MD  meloxicam (MOBIC) 7.5 MG tablet Take 7.5 mg by mouth daily.    Historical Provider, MD  mometasone-formoterol (DULERA) 100-5 MCG/ACT AERO Inhale 2 puffs into the lungs 2 (two) times daily. 06/01/16   Ginnie SmartJeffrey C Hatcher, MD  Multiple Vitamin (MULTIVITAMIN WITH MINERALS) TABS tablet Take 1 tablet by mouth daily.    Historical Provider, MD  oxyCODONE (ROXICODONE) 5 MG immediate release tablet Take 1 tablet (5 mg total) by mouth  every 6 (six) hours as needed for severe pain. 07/28/15   Costin Otelia SergeantM Gherghe, MD  PREZCOBIX 800-150 MG tablet TAKE 1 TABLET BY MOUTH EVERY DAY. SWALLOW WHOLE. DO NOT CRUSH, BREAK, OR CHEW TABLETS. TAKE WITH FOOD 10/25/16   Ginnie SmartJeffrey C Hatcher, MD  sulfamethoxazole-trimethoprim (BACTRIM DS,SEPTRA DS) 800-160 MG tablet TAKE 1 TABLET BY MOUTH DAILY 11/13/16   Ginnie SmartJeffrey C Hatcher, MD  terazosin (HYTRIN) 1 MG capsule Take 1 mg by mouth at bedtime.    Historical Provider, MD    Family History Family History  Problem Relation Age of Onset  . CAD Mother   . Stroke Mother     Social History Social History  Substance Use Topics  . Smoking status: Current Every Day Smoker    Packs/day: 2.00    Years: 38.00    Types: Cigarettes  . Smokeless tobacco: Never Used  . Alcohol use 0.0 oz/week     Comment: 6 pack per week per patient      Allergies   Aspirin   Review of Systems Review of Systems  Constitutional: Negative for chills and fever.       +Medication Refill  Respiratory: Negative for shortness of breath.   Cardiovascular: Negative for chest pain.  Neurological: Negative for dizziness, syncope, facial asymmetry, weakness and numbness.     Physical Exam Updated Vital Signs BP (!) 174/120 (BP Location: Left Arm)   Pulse 86   Temp 98.6 F (37 C) (Oral)   Resp 20   Ht 5\' 11"  (1.803 m)   Wt 203 lb (92.1 kg)   SpO2 99%   BMI 28.31 kg/m   Physical Exam  Constitutional: He is oriented to person, place, and time. He appears well-developed and well-nourished. No distress.  HENT:  Head: Normocephalic and atraumatic.  Eyes: Pupils are equal, round, and reactive to light.  Pulmonary/Chest: Effort normal.  Neurological: He is alert and oriented to person, place, and time.  Skin: Skin is warm and dry.  Psychiatric: He has a normal mood and affect.  Nursing note and vitals reviewed.    ED Treatments / Results  DIAGNOSTIC STUDIES:  Oxygen Saturation is 99% on RA, normal by my  interpretation.    COORDINATION OF CARE:  10:43 PM Discussed treatment plan with pt at bedside and pt agreed to plan.  Labs (all labs ordered are listed, but only abnormal results are displayed) Labs Reviewed - No data to display  EKG  EKG Interpretation None       Radiology No results found.  Procedures Procedures (including critical care time)  Medications Ordered in ED Medications - No data to display   Initial Impression / Assessment and Plan / ED Course  I have reviewed the triage vital signs and the nursing notes.  Pertinent labs & imaging results  that were available during my care of the patient were reviewed by me and considered in my medical decision making (see chart for details).  Clinical Course       Pt here for refill of medication. Pt given dose of oxycodone and An inhaler in the ED and discharged with albuterol inhaler. Discussed need to follow up with PCP.  Pt is safe for discharge at this time.  I feel the patient is intoxicated, based on the fact that he smells of alcohol  Final Clinical Impressions(s) / ED Diagnoses   Final diagnoses:  None    New Prescriptions New Prescriptions   No medications on file   I personally performed the services described in this documentation, which was scribed in my presence. The recorded information has been reviewed and is accurate.    Charlestine Night, PA-C 11/16/16 0248    Raeford Razor, MD 11/16/16 [phone]

## 2016-11-25 ENCOUNTER — Encounter (HOSPITAL_COMMUNITY): Payer: Self-pay | Admitting: Emergency Medicine

## 2016-11-25 DIAGNOSIS — F17210 Nicotine dependence, cigarettes, uncomplicated: Secondary | ICD-10-CM | POA: Insufficient documentation

## 2016-11-25 DIAGNOSIS — G8929 Other chronic pain: Secondary | ICD-10-CM | POA: Insufficient documentation

## 2016-11-25 DIAGNOSIS — M25551 Pain in right hip: Secondary | ICD-10-CM | POA: Insufficient documentation

## 2016-11-25 DIAGNOSIS — Z760 Encounter for issue of repeat prescription: Secondary | ICD-10-CM | POA: Insufficient documentation

## 2016-11-25 DIAGNOSIS — J449 Chronic obstructive pulmonary disease, unspecified: Secondary | ICD-10-CM | POA: Insufficient documentation

## 2016-11-25 DIAGNOSIS — Z79899 Other long term (current) drug therapy: Secondary | ICD-10-CM | POA: Insufficient documentation
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
## 2016-11-26 ENCOUNTER — Emergency Department (HOSPITAL_COMMUNITY)
Admission: EM | Admit: 2016-11-26 | Discharge: 2016-11-26 | Disposition: A | Discharge status: Home / Self Care | Payer: Self-pay | Attending: Emergency Medicine | Admitting: Emergency Medicine

## 2016-11-26 DIAGNOSIS — J441 Chronic obstructive pulmonary disease with (acute) exacerbation: Secondary | ICD-10-CM

## 2016-11-26 DIAGNOSIS — G8929 Other chronic pain: Secondary | ICD-10-CM

## 2016-11-26 DIAGNOSIS — B20 Human immunodeficiency virus [HIV] disease: Secondary | ICD-10-CM

## 2016-11-26 DIAGNOSIS — M25551 Pain in right hip: Secondary | ICD-10-CM

## 2016-11-26 DIAGNOSIS — Z21 Asymptomatic human immunodeficiency virus [HIV] infection status: Secondary | ICD-10-CM

## 2016-11-26 DIAGNOSIS — Z760 Encounter for issue of repeat prescription: Secondary | ICD-10-CM

## 2016-11-26 MED ORDER — MOMETASONE FURO-FORMOTEROL FUM 100-5 MCG/ACT IN AERO
2.0000 | INHALATION_SPRAY | Freq: Two times a day (BID) | RESPIRATORY_TRACT | Status: DC
  Filled 2016-11-26: qty 8.8

## 2016-11-26 MED ORDER — BENZONATATE 100 MG PO CAPS: ORAL_CAPSULE | ORAL | 0 refills | Status: DC

## 2016-11-26 MED ORDER — GABAPENTIN 300 MG PO CAPS
300.0000 mg | ORAL_CAPSULE | Freq: Once | ORAL | Status: AC
  Administered 2016-11-26: 300 mg via ORAL
  Filled 2016-11-26: qty 1

## 2016-11-26 MED ORDER — MELOXICAM 7.5 MG PO TABS: 7.5000 mg | ORAL_TABLET | Freq: Every day | ORAL | 0 refills | Status: DC

## 2016-11-26 MED ORDER — DOLUTEGRAVIR SODIUM 50 MG PO TABS
50.0000 mg | ORAL_TABLET | ORAL | Status: DC
  Filled 2016-11-26: qty 1

## 2016-11-26 MED ORDER — GABAPENTIN 300 MG PO CAPS: 300.0000 mg | ORAL_CAPSULE | Freq: Three times a day (TID) | ORAL | 0 refills | Status: DC

## 2016-11-26 MED ORDER — TERAZOSIN HCL 1 MG PO CAPS
1.0000 mg | ORAL_CAPSULE | Freq: Once | ORAL | Status: DC
  Filled 2016-11-26: qty 1

## 2016-11-26 MED ORDER — DULOXETINE HCL 30 MG PO CPEP
30.0000 mg | ORAL_CAPSULE | Freq: Once | ORAL | Status: AC
  Administered 2016-11-26: 30 mg via ORAL
  Filled 2016-11-26: qty 1

## 2016-11-26 MED ORDER — DRONABINOL 2.5 MG PO CAPS
2.5000 mg | ORAL_CAPSULE | ORAL | Status: DC
  Filled 2016-11-26: qty 1

## 2016-11-26 MED ORDER — DARUNAVIR-COBICISTAT 800-150 MG PO TABS: ORAL_TABLET | ORAL | 1 refills | Status: DC

## 2016-11-26 MED ORDER — DOLUTEGRAVIR SODIUM 50 MG PO TABS: 50.0000 mg | ORAL_TABLET | Freq: Every day | ORAL | 3 refills | Status: DC

## 2016-11-26 MED ORDER — EMTRICITABINE-TENOFOVIR AF 200-25 MG PO TABS: 1.0000 | ORAL_TABLET | Freq: Every day | ORAL | 1 refills | Status: DC

## 2016-11-26 MED ORDER — DRONABINOL 2.5 MG PO CAPS: ORAL_CAPSULE | ORAL | 4 refills | Status: DC

## 2016-11-26 MED ORDER — TERAZOSIN HCL 1 MG PO CAPS: 1.0000 mg | ORAL_CAPSULE | Freq: Every day | ORAL | 0 refills | Status: DC

## 2016-11-26 MED ORDER — OXYCODONE HCL 5 MG PO TABS
5.0000 mg | ORAL_TABLET | Freq: Once | ORAL | Status: AC
  Administered 2016-11-26: 5 mg via ORAL
  Filled 2016-11-26: qty 1

## 2016-11-26 MED ORDER — BENZONATATE 100 MG PO CAPS
100.0000 mg | ORAL_CAPSULE | Freq: Once | ORAL | Status: AC
  Administered 2016-11-26: 100 mg via ORAL
  Filled 2016-11-26: qty 1

## 2016-11-26 MED ORDER — OXYCODONE HCL 5 MG PO TABS: 5.0000 mg | ORAL_TABLET | Freq: Four times a day (QID) | ORAL | 0 refills | Status: DC | PRN

## 2016-11-26 MED ORDER — DULOXETINE HCL 30 MG PO CPEP: 30.0000 mg | ORAL_CAPSULE | Freq: Every day | ORAL | 0 refills | Status: DC

## 2016-11-26 MED ORDER — SULFAMETHOXAZOLE-TRIMETHOPRIM 800-160 MG PO TABS: 1.0000 | ORAL_TABLET | Freq: Every day | ORAL | 5 refills | Status: DC

## 2016-11-26 MED ORDER — EMTRICITABINE-TENOFOVIR AF 200-25 MG PO TABS
1.0000 | ORAL_TABLET | ORAL | Status: DC
  Filled 2016-11-26: qty 1

## 2016-11-26 MED ORDER — SULFAMETHOXAZOLE-TRIMETHOPRIM 800-160 MG PO TABS
1.0000 | ORAL_TABLET | Freq: Two times a day (BID) | ORAL | Status: DC
  Administered 2016-11-26: 1 via ORAL
  Filled 2016-11-26: qty 1

## 2016-11-26 MED ORDER — ALBUTEROL SULFATE HFA 108 (90 BASE) MCG/ACT IN AERS
2.0000 | INHALATION_SPRAY | RESPIRATORY_TRACT | Status: DC | PRN
Start: 2016-11-26 — End: 2016-11-26
  Administered 2016-11-26: 2 via RESPIRATORY_TRACT
  Filled 2016-11-26: qty 6.7

## 2016-11-26 NOTE — ED Notes (Signed)
Patient Alert and oriented X4. Stable and ambulatory. Patient verbalized understanding of the discharge instructions.  Patient belongings were taken by the patient.  
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
## 2016-11-26 NOTE — ED Notes (Signed)
Pt stopped registration staff stating he needed help. This EMT approached and pt was immediately agitated and stated that he was told his name was next to go back. Pt was upset that he head heard "5 names called" and his had not yet been called. Pt kept interrupting this EMT and would not allow any explanation. Pt name had just been placed in a room and once pt was made aware of such, he just kept saying this EMTs name and that he understood.

## 2016-11-26 NOTE — Discharge Instructions (Signed)
FOLLOW UP WITH YOUR DOCTOR FOR FURTHER MEDICATION REFILLS.

## 2016-11-26 NOTE — ED Notes (Signed)
Pt called to go back to room by [facility] [employer] operational officersecretary. Room was not clean so Pod B secretary asked him to wait. He stated he would go outside and smoke and left without further explanation. On the way out pt yelled at nurse first staff that he was going outside and if he "wanted to wait another 2 hours he would check back in". Pt noted to use expletives while speaking with staff.

## 2016-11-26 NOTE — ED Notes (Signed)
Patient aware that there were meds coming from pharmacy for him.  He did not want to wait and left willingly. PA aware

## 2016-12-13 ENCOUNTER — Ambulatory Visit: Payer: Self-pay | Admitting: Infectious Diseases

## 2016-12-14 ENCOUNTER — Other Ambulatory Visit: Payer: Self-pay

## 2016-12-14 DIAGNOSIS — J441 Chronic obstructive pulmonary disease with (acute) exacerbation: Secondary | ICD-10-CM

## 2016-12-14 MED ORDER — MOMETASONE FURO-FORMOTEROL FUM 100-5 MCG/ACT IN AERO
2.0000 | INHALATION_SPRAY | Freq: Two times a day (BID) | RESPIRATORY_TRACT | 5 refills | Status: DC
Start: 2016-12-14 — End: 2017-06-14

## 2016-12-14 MED ORDER — MOMETASONE FURO-FORMOTEROL FUM 100-5 MCG/ACT IN AERO: 2.0000 | INHALATION_SPRAY | Freq: Two times a day (BID) | RESPIRATORY_TRACT | 5 refills | Status: DC

## 2016-12-15 ENCOUNTER — Encounter (HOSPITAL_COMMUNITY): Payer: Self-pay | Admitting: Emergency Medicine

## 2016-12-15 ENCOUNTER — Emergency Department (HOSPITAL_COMMUNITY)
Admission: EM | Admit: 2016-12-15 | Discharge: 2016-12-15 | Disposition: A | Discharge status: Home / Self Care | Payer: Self-pay | Attending: Dermatology | Admitting: Dermatology

## 2016-12-15 DIAGNOSIS — Z5321 Procedure and treatment not carried out due to patient leaving prior to being seen by health care provider: Secondary | ICD-10-CM | POA: Insufficient documentation

## 2016-12-15 DIAGNOSIS — Z760 Encounter for issue of repeat prescription: Secondary | ICD-10-CM | POA: Insufficient documentation

## 2016-12-15 NOTE — ED Notes (Signed)
Patient being very loud, disruptive and belligerent in waiting and with other patients and family members in waiting room.  Security called multiple times to speak with patient.  Patient is intoxicated.

## 2016-12-15 NOTE — ED Notes (Signed)
Patient escorted out of ED by security and [employer].

## 2016-12-15 NOTE — ED Notes (Signed)
Pt removed by [employer] and security

## 2016-12-15 NOTE — ED Triage Notes (Signed)
Patient here needing refill on pain meds.  Patient states that he is homeless and can not afford them.  He states he also needs some Librium.  Patient is hypertension.  Patient states that he has had some alcohol today.  Patient is aggressive in triage.

## 2017-01-08 ENCOUNTER — Encounter: Payer: Self-pay | Admitting: Infectious Diseases

## 2017-01-08 ENCOUNTER — Ambulatory Visit (INDEPENDENT_AMBULATORY_CARE_PROVIDER_SITE_OTHER): Payer: Self-pay | Admitting: Infectious Diseases

## 2017-01-08 ENCOUNTER — Telehealth: Payer: Self-pay | Admitting: *Deleted

## 2017-01-08 VITALS — BP 163/117 | HR 71 | Temp 98.1°F | Wt 199.0 lb

## 2017-01-08 DIAGNOSIS — Z113 Encounter for screening for infections with a predominantly sexual mode of transmission: Secondary | ICD-10-CM

## 2017-01-08 DIAGNOSIS — Z79899 Other long term (current) drug therapy: Secondary | ICD-10-CM

## 2017-01-08 DIAGNOSIS — Z5900 Homelessness unspecified: Secondary | ICD-10-CM | POA: Insufficient documentation

## 2017-01-08 DIAGNOSIS — A630 Anogenital (venereal) warts: Secondary | ICD-10-CM | POA: Insufficient documentation

## 2017-01-08 DIAGNOSIS — N400 Enlarged prostate without lower urinary tract symptoms: Secondary | ICD-10-CM | POA: Insufficient documentation

## 2017-01-08 DIAGNOSIS — Z23 Encounter for immunization: Secondary | ICD-10-CM

## 2017-01-08 DIAGNOSIS — B20 Human immunodeficiency virus [HIV] disease: Secondary | ICD-10-CM

## 2017-01-08 DIAGNOSIS — B182 Chronic viral hepatitis C: Secondary | ICD-10-CM

## 2017-01-08 DIAGNOSIS — R3911 Hesitancy of micturition: Secondary | ICD-10-CM

## 2017-01-08 LAB — CBC
HCT: 42.6 % (ref 38.5–50.0)
Hemoglobin: 14.7 g/dL (ref 13.2–17.1)
MCH: 32.9 pg (ref 27.0–33.0)
MCHC: 34.5 g/dL (ref 32.0–36.0)
MCV: 95.3 fL (ref 80.0–100.0)
MPV: 10.3 fL (ref 7.5–12.5)
PLATELETS: 153 10*3/uL (ref 140–400)
RBC: 4.47 MIL/uL (ref 4.20–5.80)
RDW: 13.1 % (ref 11.0–15.0)
WBC: 5.9 10*3/uL (ref 3.8–10.8)

## 2017-01-08 LAB — PSA: PSA: 0.5 ng/mL (ref <=4.0)

## 2017-01-08 LAB — COMPREHENSIVE METABOLIC PANEL
ALT: 25 U/L (ref 9–46)
AST: 26 U/L (ref 10–35)
Albumin: 4.4 g/dL (ref 3.6–5.1)
Alkaline Phosphatase: 59 U/L (ref 40–115)
BUN: 17 mg/dL (ref 7–25)
CHLORIDE: 103 mmol/L (ref 98–110)
CO2: 25 mmol/L (ref 20–31)
Calcium: 9.6 mg/dL (ref 8.6–10.3)
Creat: 1.19 mg/dL (ref 0.70–1.33)
Glucose, Bld: 97 mg/dL (ref 65–99)
POTASSIUM: 4.2 mmol/L (ref 3.5–5.3)
Sodium: 136 mmol/L (ref 135–146)
TOTAL PROTEIN: 7.8 g/dL (ref 6.1–8.1)
Total Bilirubin: 0.4 mg/dL (ref 0.2–1.2)

## 2017-01-08 LAB — LIPID PANEL
CHOL/HDL RATIO: 3.2 ratio (ref <5.0)
Cholesterol: 139 mg/dL (ref <200)
HDL: 44 mg/dL (ref >40)
LDL Cholesterol: 60 mg/dL (ref <100)
TRIGLYCERIDES: 174 mg/dL — AB (ref <150)
VLDL: 35 mg/dL — AB (ref <30)

## 2017-01-08 NOTE — Assessment & Plan Note (Signed)
Will get him back in with [facility].

## 2017-01-08 NOTE — Assessment & Plan Note (Signed)
Will check a PSA, have him seen by uro.,

## 2017-01-08 NOTE — Assessment & Plan Note (Signed)
Will try to get him back in touch with [facility].

## 2017-01-08 NOTE — Addendum Note (Signed)
Addended by: Andree CossHOWELL, Josafat Enrico M on: 01/08/2017 05:28 PM   Modules accepted: Orders

## 2017-01-08 NOTE — Telephone Encounter (Signed)
Dr Ninetta LightsHatcher has referred the patient for Urology consult as well as placed an order for [facility].  The patient will need [facility] Patient Assistance/[facility] Card. As far as the urology referral, he will need to be referred to [facility] Res[facility] clinic [facility] work.  Patient also referred [facility] to [facility]/Dr Maurice MarchBarroso for follow up/anoscopy/treatment.  The study contact is Tivis RingerJennifer Zang [phone]. Andree CossHowell, Michelle M, RN

## 2017-01-08 NOTE — Assessment & Plan Note (Addendum)
Will recheck his RNA.  Will check his liver u/s as well due to his fibrosis score.

## 2017-01-08 NOTE — Progress Notes (Signed)
   Subjective:    Patient ID: Steven [name], male    DOB: 01/07/1966, 51 y.o.   MRN: [id number]  HPI 51 yo M with hx of HIV+ since (previously on DTVr/TRV), [facility]. Has been positive 1985. CVA 2010.  He had anal pap 06-2011 which showed ASCUS.  [facility], mild esophageal tear.  Hep C 1a- previously treated 2017.  Having trouble getting his medications filled. Meets with pharm this AM.  He has been hypertensive. Has decreased his salt intake.   Has been having trouble with his rectum- has been having urinary straining, trickle.   Was supposed to have surgery for his prev ASCUS but missed. He feels like he has external hemorrhoid.   Smoking 1ppd.  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   HIV 1 RNA Quant (copies/mL)  Date Value  09/13/2016 <20  05/31/2016 56,483 (H)  01/12/2016 29,970 (H)   CD4 T Cell Abs (/uL)  Date Value  09/13/2016 170 (L)  05/31/2016 200 (L)  12/21/2015 100 (L)     Review of Systems  Constitutional: Negative for appetite change and unexpected weight change.  Respiratory: Positive for shortness of breath.   Cardiovascular: Negative for chest pain.  Gastrointestinal: Negative for constipation and diarrhea.  Genitourinary: Positive for decreased urine volume and urgency.  Neurological: Positive for headaches.  DOE with 10-15 steps.  Persistent neuropathic pain    Objective:   Physical Exam  Constitutional: He appears well-developed and well-nourished.  HENT:  Mouth/Throat: No oropharyngeal exudate.  Eyes: EOM are normal. Pupils are equal, round, and reactive to light.  Neck: Neck supple.  Cardiovascular: Normal rate, regular rhythm and normal heart sounds.   Pulmonary/Chest: Effort normal and breath sounds normal.  Abdominal: Soft. Bowel sounds are normal. There is no tenderness. There is no rebound.  Genitourinary:        Musculoskeletal: He exhibits no edema.  Lymphadenopathy:    He has no  cervical adenopathy.      Assessment & Plan:

## 2017-01-08 NOTE — Addendum Note (Signed)
Addended by: Lurlean LeydenPOOLE, TRAVIS F on: 01/08/2017 10:13 AM   Modules accepted: Orders

## 2017-01-08 NOTE — Assessment & Plan Note (Addendum)
Will check his labs today.  Offered/refused condoms.  Will get him labs today.  Mening today.  apprecaite pharm eval.  Will see him back in 6 months.

## 2017-01-09 ENCOUNTER — Telehealth: Payer: Self-pay | Admitting: Lab

## 2017-01-09 LAB — RPR: RPR Ser Ql: REACTIVE — AB

## 2017-01-09 LAB — FLUORESCENT TREPONEMAL AB(FTA)-IGG-BLD: FLUORESCENT TREPONEMAL ABS: REACTIVE — AB

## 2017-01-09 LAB — RPR TITER: RPR Titer: 1:1 {titer}

## 2017-01-09 LAB — T-HELPER CELL (CD4) - (RCID CLINIC ONLY)
CD4 T CELL HELPER: 15 % — AB (ref 33–55)
CD4 T Cell Abs: 220 /uL — ABNORMAL LOW (ref 400–2700)

## 2017-01-09 NOTE — Telephone Encounter (Signed)
Patient will likely come back into clinic with a working phone number.

## 2017-01-09 NOTE — Telephone Encounter (Signed)
Steven [name] asked me to call patient regarding financial assistance.  Telephone number not valid.

## 2017-01-10 LAB — HIV-1 RNA QUANT-NO REFLEX-BLD
HIV 1 RNA Quant: <20 copies/mL — AB
HIV-1 RNA Quant, Log: <1.3 Log copies/mL — AB

## 2017-01-11 LAB — HEPATITIS C RNA QUANTITATIVE
HCV QUANT LOG: NOT DETECTED {Log_IU}/mL
HCV QUANT: NOT DETECTED [IU]/mL

## 2017-03-08 ENCOUNTER — Ambulatory Visit: Payer: Self-pay

## 2017-03-21 ENCOUNTER — Other Ambulatory Visit: Payer: Self-pay | Admitting: Infectious Diseases

## 2017-03-21 DIAGNOSIS — B20 Human immunodeficiency virus [HIV] disease: Secondary | ICD-10-CM

## 2017-03-22 ENCOUNTER — Other Ambulatory Visit: Payer: Self-pay | Admitting: *Deleted

## 2017-03-22 ENCOUNTER — Other Ambulatory Visit: Payer: Self-pay | Admitting: Infectious Diseases

## 2017-03-22 DIAGNOSIS — F32A Depression, unspecified: Secondary | ICD-10-CM

## 2017-03-22 DIAGNOSIS — F329 Major depressive disorder, single episode, unspecified: Secondary | ICD-10-CM

## 2017-03-22 DIAGNOSIS — B20 Human immunodeficiency virus [HIV] disease: Secondary | ICD-10-CM

## 2017-03-22 MED ORDER — DOLUTEGRAVIR SODIUM 50 MG PO TABS
50.0000 mg | ORAL_TABLET | Freq: Every day | ORAL | 5 refills | Status: DC
Start: 2017-03-22 — End: 2017-03-25

## 2017-03-22 MED ORDER — EMTRICITABINE-TENOFOVIR AF 200-25 MG PO TABS: 1.0000 | ORAL_TABLET | Freq: Every day | ORAL | 5 refills | Status: DC

## 2017-03-22 MED ORDER — DARUNAVIR-COBICISTAT 800-150 MG PO TABS: ORAL_TABLET | ORAL | 5 refills | Status: DC

## 2017-03-25 ENCOUNTER — Emergency Department (HOSPITAL_COMMUNITY)
Admission: EM | Admit: 2017-03-25 | Discharge: 2017-03-25 | Disposition: A | Discharge status: Home / Self Care | Payer: Medicaid Other | Attending: Emergency Medicine | Admitting: Emergency Medicine

## 2017-03-25 ENCOUNTER — Encounter (HOSPITAL_COMMUNITY): Payer: Self-pay | Admitting: Emergency Medicine

## 2017-03-25 DIAGNOSIS — J449 Chronic obstructive pulmonary disease, unspecified: Secondary | ICD-10-CM | POA: Diagnosis not present

## 2017-03-25 DIAGNOSIS — F17210 Nicotine dependence, cigarettes, uncomplicated: Secondary | ICD-10-CM | POA: Diagnosis not present

## 2017-03-25 DIAGNOSIS — Z79899 Other long term (current) drug therapy: Secondary | ICD-10-CM | POA: Insufficient documentation

## 2017-03-25 DIAGNOSIS — B20 Human immunodeficiency virus [HIV] disease: Secondary | ICD-10-CM

## 2017-03-25 DIAGNOSIS — Z760 Encounter for issue of repeat prescription: Secondary | ICD-10-CM | POA: Insufficient documentation

## 2017-03-25 MED ORDER — EMTRICITABINE-TENOFOVIR AF 200-25 MG PO TABS
1.0000 | ORAL_TABLET | Freq: Once | ORAL | Status: AC
  Administered 2017-03-25: 1 via ORAL
  Filled 2017-03-25: qty 1

## 2017-03-25 MED ORDER — SULFAMETHOXAZOLE-TRIMETHOPRIM 800-160 MG PO TABS
1.0000 | ORAL_TABLET | Freq: Once | ORAL | Status: AC
  Administered 2017-03-25: 1 via ORAL
  Filled 2017-03-25: qty 1

## 2017-03-25 MED ORDER — DOLUTEGRAVIR SODIUM 50 MG PO TABS
50.0000 mg | ORAL_TABLET | Freq: Once | ORAL | Status: AC
  Administered 2017-03-25: 50 mg via ORAL
  Filled 2017-03-25: qty 1

## 2017-03-25 MED ORDER — DOLUTEGRAVIR SODIUM 50 MG PO TABS: 50.0000 mg | ORAL_TABLET | Freq: Every day | ORAL | 0 refills | Status: DC

## 2017-03-25 MED ORDER — DARUNAVIR-COBICISTAT 800-150 MG PO TABS
1.0000 | ORAL_TABLET | Freq: Once | ORAL | Status: AC
  Administered 2017-03-25: 1 via ORAL
  Filled 2017-03-25: qty 1

## 2017-03-25 MED ORDER — SULFAMETHOXAZOLE-TRIMETHOPRIM 800-160 MG PO TABS: 1.0000 | ORAL_TABLET | Freq: Every day | ORAL | 0 refills | Status: AC

## 2017-03-25 MED ORDER — DULOXETINE HCL 60 MG PO CPEP
60.0000 mg | ORAL_CAPSULE | Freq: Once | ORAL | Status: AC
  Administered 2017-03-25: 60 mg via ORAL
  Filled 2017-03-25: qty 1

## 2017-03-25 MED ORDER — DULOXETINE HCL 60 MG PO CPEP: 60.0000 mg | ORAL_CAPSULE | Freq: Every day | ORAL | 0 refills | Status: DC

## 2017-03-25 MED ORDER — ALBUTEROL SULFATE HFA 108 (90 BASE) MCG/ACT IN AERS
1.0000 | INHALATION_SPRAY | Freq: Once | RESPIRATORY_TRACT | Status: AC
  Administered 2017-03-25: 1 via RESPIRATORY_TRACT
  Filled 2017-03-25: qty 6.7

## 2017-03-25 MED ORDER — DARUNAVIR-COBICISTAT 800-150 MG PO TABS: ORAL_TABLET | ORAL | 0 refills | Status: DC

## 2017-03-25 MED ORDER — EMTRICITABINE-TENOFOVIR AF 200-25 MG PO TABS: 1.0000 | ORAL_TABLET | Freq: Every day | ORAL | 0 refills | Status: DC

## 2017-03-25 NOTE — ED Notes (Signed)
Pt states he has only had one beer.

## 2017-03-25 NOTE — ED Notes (Signed)
Waiting on Mx to come from pharmacy to discharge pt.

## 2017-03-25 NOTE — ED Triage Notes (Signed)
Pt states he recently approved for disability, qualifying him for Medicaid, pt states this automatically kicked him out of the program that paid for his meds. Pt has been out of all meds x 3 days.  Pt states he felt his "mind racing" today and felt he needed to call [employer] and come to ED for meds.  Pt denies SI/HI.  Pt states he has had etoh today.

## 2017-03-25 NOTE — ED Notes (Signed)
Pt verbalized understanding discharge instructions and denies any further needs or questions at this time. VS stable, ambulatory and steady gait.   

## 2017-03-25 NOTE — ED Triage Notes (Signed)
Pt states he needs a dose of all HIV meds, inhaler, pain and anxiety meds.

## 2017-03-25 NOTE — ED Provider Notes (Signed)
[facility]EM[facility]ENCY DEPT Provider Note   CSN: [id number] Arrival date & time: 03/25/17  2004     History   Chief Complaint Chief Complaint  Patient presents with  . Medication Refill    HPI Steven [name] is a 51 y.o. male.  The history is provided by the patient.  Medication Refill  Medications/supplies requested:  HIV meds, asthma meds Reason for request:  Medications ran out (could not afford medications d/t change in insurance) Medications taken before: yes - see home medications   Patient has complete original prescription information: yes     Past Medical History:  Diagnosis Date  . AIDS (HCC) 1985   . Asthma   . Cerebral hemorrhage (HCC) 2000  . Hemophilia (HCC)   . Hepatitis C    never treated  . HIV (human immunodeficiency virus infection) [facility])     Patient Active Problem List   Diagnosis Date Noted  . Condyloma acuminatum of perianal region 01/08/2017  . Homeless 01/08/2017  . Urinary hesitancy 01/08/2017  . COPD exacerbation (HCC) 01/12/2016  . Depression 08/04/2015  . Hepatitis C 08/02/2015  . HIV disease (HCC) 07/28/2015  . Other pancytopenia (HCC) 07/28/2015  . Esophageal tear   . Gastrointestinal hemorrhage with melena   . GI bleed 07/26/2015  . Numbness on left side 07/15/2013    Past Surgical History:  Procedure Laterality Date  . ESOPHAGOGASTRODUODENOSCOPY (EGD) WITH PROPOFOL N/A 07/27/2015   Procedure: ESOPHAGOGASTRODUODENOSCOPY (EGD) WITH PROPOFOL;  Surgeon: Hilarie Fredrickson, MD;  Location: [facility] ENDOSCOPY;  Service: Endoscopy;  Laterality: N/A;  . HERNIA REPAIR    . laporotomy    . MANDIBLE SURGERY         Home Medications    Prior to Admission medications   Medication Sig Start Date End Date Taking? Authorizing Provider  albuterol (PROVENTIL HFA;VENTOLIN HFA) 108 (90 Base) MCG/ACT inhaler Inhale 1-2 puffs into the lungs every 6 (six) hours as needed for wheezing or shortness of breath. 01/12/16   Ginnie Smart, MD  benzonatate  (TESSALON) 100 MG capsule TAKE 1 CAPSULE(100 MG) BY MOUTH THREE TIMES DAILY AS NEEDED FOR COUGH 11/26/16   Elpidio Anis, PA-C  chlorhexidine (PERIDEX) 0.12 % solution Use as directed 10 mLs in the mouth or throat 4 (four) times daily. Swish and spit 06/15/16   Derwood Kaplan, MD  darunavir-cobicistat (PREZCOBIX) 800-150 MG tablet TAKE 1 TABLET BY MOUTH EVERY DAY. SWALLOW WHOLE. DO NOT CRUSH, BREAK, OR CHEW TABLETS. TAKE WITH FOOD 03/25/17   Lyndal Pulley, MD  diclofenac sodium (VOLTAREN) 1 % GEL Apply 2 g topically 4 (four) times daily. 07/24/16   April Palumbo, MD  diphenhydrAMINE (BENADRYL) 25 mg capsule Take 25 mg by mouth every 6 (six) hours as needed for allergies.    Historical Provider, MD  dolutegravir (TIVICAY) 50 MG tablet Take 1 tablet (50 mg total) by mouth daily. 03/25/17 04/04/17  Lyndal Pulley, MD  dronabinol (MARINOL) 2.5 MG capsule TAKE ONE CAPSULE BY MOUTH TWICE DAILY BEFORE A MEAL 11/26/16   Elpidio Anis, PA-C  DULoxetine (CYMBALTA) 60 MG capsule Take 1 capsule (60 mg total) by mouth daily. 03/25/17 04/04/17  Lyndal Pulley, MD  emtricitabine-tenofovir AF (DESCOVY) 200-25 MG tablet Take 1 tablet by mouth daily. 03/25/17 04/04/17  Lyndal Pulley, MD  gabapentin (NEURONTIN) 300 MG capsule Take 1 capsule (300 mg total) by mouth 3 (three) times daily. 11/26/16   Elpidio Anis, PA-C  hydroxypropyl methylcellulose (ISOPTO TEARS) 2.5 % ophthalmic solution Place 1 drop into both eyes 3 (three)  times daily as needed for dry eyes.    Historical Provider, MD  meloxicam (MOBIC) 7.5 MG tablet Take 1 tablet (7.5 mg total) by mouth daily. 11/26/16   Elpidio Anis, PA-C  metoprolol (LOPRESSOR) 50 MG tablet Take 50 mg by mouth 2 (two) times daily. 12/07/16   Historical Provider, MD  mometasone-formoterol (DULERA) 100-5 MCG/ACT AERO Inhale 2 puffs into the lungs 2 (two) times daily. 12/14/16   Gardiner Barefoot, MD  Multiple Vitamin (MULTIVITAMIN WITH MINERALS) TABS tablet Take 1 tablet by mouth daily.    Historical  Provider, MD  oxyCODONE (ROXICODONE) 5 MG immediate release tablet Take 1 tablet (5 mg total) by mouth every 6 (six) hours as needed for severe pain. 11/26/16   Elpidio Anis, PA-C  sulfamethoxazole-trimethoprim (BACTRIM DS,SEPTRA DS) 800-160 MG tablet Take 1 tablet by mouth daily. 03/25/17 04/04/17  Lyndal Pulley, MD  terazosin (HYTRIN) 1 MG capsule Take 1 capsule (1 mg total) by mouth at bedtime. 11/26/16   Elpidio Anis, PA-C    Family History Family History  Problem Relation Age of Onset  . CAD Mother   . Stroke Mother     Social History Social History  Substance Use Topics  . Smoking status: Current Every Day Smoker    Packs/day: 2.00    Years: 38.00    Types: Cigarettes  . Smokeless tobacco: Never Used  . Alcohol use 0.0 oz/week     Comment: 6 pack per week per patient      Allergies   Aspirin   Review of Systems Review of Systems  All other systems reviewed and are negative.    Physical Exam Updated Vital Signs BP 133/87   Pulse 81   Temp 98.9 F (37.2 C) (Oral)   Resp 16   Ht  (1.803 m)   Wt 194 lb (88 kg)   SpO2 90%   BMI 27.06 kg/m   Physical Exam  Constitutional: He is oriented to person, place, and time. He appears well-developed and well-nourished. No distress.  HENT:  Head: Normocephalic and atraumatic.  Nose: Nose normal.  Eyes: Conjunctivae are normal.  Neck: Neck supple. No tracheal deviation present.  Cardiovascular: Normal rate and regular rhythm.   Pulmonary/Chest: Effort normal. No respiratory distress.  Abdominal: Soft. He exhibits no distension.  Neurological: He is alert and oriented to person, place, and time.  Skin: Skin is warm and dry.  Psychiatric: He has a normal mood and affect.  Vitals reviewed.    ED Treatments / Results  Labs (all labs ordered are listed, but only abnormal results are displayed) Labs Reviewed - No data to display  EKG  EKG Interpretation None       Radiology No results  found.  Procedures Procedures (including critical care time)  Medications Ordered in ED Medications  albuterol (PROVENTIL HFA;VENTOLIN HFA) 108 (90 Base) MCG/ACT inhaler 1 puff (not administered)  darunavir-cobicistat (PREZCOBIX) 800-150 MG per tablet 1 tablet (not administered)  dolutegravir (TIVICAY) tablet 50 mg (not administered)  DULoxetine (CYMBALTA) DR capsule 60 mg (not administered)  emtricitabine-tenofovir AF (DESCOVY) 200-25 MG per tablet 1 tablet (not administered)  sulfamethoxazole-trimethoprim (BACTRIM DS,SEPTRA DS) 800-160 MG per tablet 1 tablet (not administered)     Initial Impression / Assessment and Plan / ED Course  I have reviewed the triage vital signs and the nursing notes.  Pertinent labs & imaging results that were available during my care of the patient were reviewed by me and considered in my medical decision making (see  chart for details).     51 y.o. male presents with inability to fill his medication prescriptions because of change of insurance. He is getting his medicaid card at the end of the month but asks for 10 day Rx to get through this time which was provided. Given dose here. He happens to have been drinking tonight but is clinically sober and ambulatory.   Final Clinical Impressions(s) / ED Diagnoses   Final diagnoses:  Medication refill    New Prescriptions Current Discharge Medication List       Lyndal Pulley, MD 03/26/17 [phone]

## 2017-04-21 ENCOUNTER — Encounter (HOSPITAL_COMMUNITY): Payer: Self-pay | Admitting: Nurse Practitioner

## 2017-04-21 ENCOUNTER — Emergency Department (HOSPITAL_COMMUNITY)
Admission: EM | Admit: 2017-04-21 | Discharge: 2017-04-21 | Disposition: A | Discharge status: Home / Self Care | Payer: Medicaid Other | Attending: Emergency Medicine | Admitting: Emergency Medicine

## 2017-04-21 DIAGNOSIS — F10120 Alcohol abuse with intoxication, uncomplicated: Secondary | ICD-10-CM | POA: Diagnosis present

## 2017-04-21 DIAGNOSIS — Z5321 Procedure and treatment not carried out due to patient leaving prior to being seen by health care provider: Secondary | ICD-10-CM | POA: Insufficient documentation

## 2017-04-21 NOTE — ED Notes (Signed)
Patient is no longer seen in lobby despite being called 3 times. Per staff patient was seen going out the door and verbalizing that he is leaving.

## 2017-04-21 NOTE — ED Triage Notes (Signed)
Pt states he needs valium to slow his thoughts down, and endorses alcohol intoxication.

## 2017-04-21 NOTE — ED Notes (Signed)
Pt c/o being on so many medications that he is "sped up." He stated that he would like 20 mg valium to help slow him down. Denies HI or SI. If he doesn't get medication before he leaves, he stated he would be "wide open."

## 2017-04-21 NOTE — ED Notes (Signed)
Pt told staff, "tell them I'm leaving."

## 2017-04-26 ENCOUNTER — Telehealth: Payer: Self-pay | Admitting: *Deleted

## 2017-04-26 NOTE — Telephone Encounter (Signed)
Patient requesting rx for injectable B-12 medication plus the syringes to administer.  Patient does not have a current telephone number.  [facility] is = [address], [phone].  [facility]  [facility] that [facility] last fill for his HIV medications was 02/20/17.  Routing this message to Dr. Ninetta LightsHatcher for response.

## 2017-05-01 ENCOUNTER — Encounter (HOSPITAL_COMMUNITY): Payer: Self-pay | Admitting: Emergency Medicine

## 2017-05-01 DIAGNOSIS — Z5321 Procedure and treatment not carried out due to patient leaving prior to being seen by health care provider: Secondary | ICD-10-CM | POA: Diagnosis not present

## 2017-05-01 DIAGNOSIS — S0083XA Contusion of other part of head, initial encounter: Secondary | ICD-10-CM | POA: Diagnosis not present

## 2017-05-01 DIAGNOSIS — Y929 Unspecified place or not applicable: Secondary | ICD-10-CM | POA: Diagnosis not present

## 2017-05-01 DIAGNOSIS — S0993XA Unspecified injury of face, initial encounter: Secondary | ICD-10-CM | POA: Diagnosis present

## 2017-05-01 DIAGNOSIS — Y999 Unspecified external cause status: Secondary | ICD-10-CM | POA: Diagnosis not present

## 2017-05-01 DIAGNOSIS — F17210 Nicotine dependence, cigarettes, uncomplicated: Secondary | ICD-10-CM | POA: Diagnosis not present

## 2017-05-01 DIAGNOSIS — Y939 Activity, unspecified: Secondary | ICD-10-CM | POA: Diagnosis not present

## 2017-05-01 DIAGNOSIS — J45909 Unspecified asthma, uncomplicated: Secondary | ICD-10-CM | POA: Diagnosis not present

## 2017-05-01 NOTE — Telephone Encounter (Signed)
Would have Marthann SchillerMitch find him He can get B12 here if he is deficient.
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
## 2017-05-02 ENCOUNTER — Telehealth: Payer: Self-pay | Admitting: *Deleted

## 2017-05-02 ENCOUNTER — Other Ambulatory Visit: Payer: Self-pay | Admitting: Pharmacist

## 2017-05-02 ENCOUNTER — Emergency Department (HOSPITAL_COMMUNITY)
Admission: EM | Admit: 2017-05-02 | Discharge: 2017-05-02 | Disposition: A | Discharge status: Home / Self Care | Payer: Medicaid Other | Attending: Emergency Medicine | Admitting: Emergency Medicine

## 2017-05-02 ENCOUNTER — Ambulatory Visit (INDEPENDENT_AMBULATORY_CARE_PROVIDER_SITE_OTHER): Payer: Medicaid Other | Admitting: *Deleted

## 2017-05-02 DIAGNOSIS — B20 Human immunodeficiency virus [HIV] disease: Secondary | ICD-10-CM

## 2017-05-02 DIAGNOSIS — Z23 Encounter for immunization: Secondary | ICD-10-CM

## 2017-05-02 DIAGNOSIS — F3289 Other specified depressive episodes: Secondary | ICD-10-CM

## 2017-05-02 DIAGNOSIS — Z21 Asymptomatic human immunodeficiency virus [HIV] infection status: Secondary | ICD-10-CM

## 2017-05-02 DIAGNOSIS — S0083XA Contusion of other part of head, initial encounter: Secondary | ICD-10-CM

## 2017-05-02 DIAGNOSIS — Z0000 Encounter for general adult medical examination without abnormal findings: Secondary | ICD-10-CM

## 2017-05-02 MED ORDER — OXYCODONE-ACETAMINOPHEN 5-325 MG PO TABS
2.0000 | ORAL_TABLET | Freq: Once | ORAL | Status: AC
  Administered 2017-05-02: 2 via ORAL
  Filled 2017-05-02: qty 2

## 2017-05-02 MED ORDER — DARUNAVIR-COBICISTAT 800-150 MG PO TABS: 1.0000 | ORAL_TABLET | Freq: Every day | ORAL | 5 refills | Status: DC

## 2017-05-02 MED ORDER — DRONABINOL 2.5 MG PO CAPS: ORAL_CAPSULE | ORAL | 4 refills | Status: DC

## 2017-05-02 MED ORDER — EMTRICITABINE-TENOFOVIR AF 200-25 MG PO TABS: 1.0000 | ORAL_TABLET | Freq: Every day | ORAL | 5 refills | Status: DC

## 2017-05-02 MED ORDER — DOLUTEGRAVIR SODIUM 50 MG PO TABS: 50.0000 mg | ORAL_TABLET | Freq: Every day | ORAL | 5 refills | Status: DC
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
## 2017-05-02 NOTE — ED Notes (Signed)
Nurse First Rounds: pt. sleeping with no distress at waiting area /respirations unlabored .

## 2017-05-02 NOTE — ED Notes (Signed)
The pt keeps wandering around in the dept

## 2017-05-02 NOTE — Telephone Encounter (Signed)
Walk-in to [facility].  Patient has received Medicaid.  Requesting #2 Menveo injection, toothbrush/tooth paste, referral to psychiatrist, referral to primary care physician, food from pantry and picking up medication.  Patient given tooth brush/paste, food, Menveo injection and medication.  RN received verbal order from Dr. Ninetta LightsHatcher for PCP and psychiatrist referrals.  [facility]ed [facility] to [facility] the patient's HIV regimen. Patient now has Medicaid.  If listed Family Medicine not available please find PCP on the bus line. Patient only has bus transportation.  Patient needs Psychiatric referral office on the bus line as well.

## 2017-05-02 NOTE — ED Notes (Signed)
Called to reassess vitals  No answer

## 2017-05-02 NOTE — ED Notes (Signed)
While assessing vital signs for patients in the waiting room.  I walked by patient (Mr Steven [name]) who was sitting in the waiting area by triage. Mr Steven [name], stated that I needed to take his blood pressure.  I told him that I would be around in a few minutes.  He the stated "no you won't because our skin color doesn't match."  I wasn't sure of what he said so, I said," I am sorry?"  He then said "obviously our color isn't the same as mine so you don't care."  Then he proceeds to loudly say, "see I knew you would walk away."   Prior to this occurrence, Security and [employer] had been called to ask him to be mindful of his words after RN Selena BattenKim overheard him state "I could kill everyone in here and wouldn't even to go to jail".

## 2017-05-02 NOTE — Telephone Encounter (Signed)
Patient came in to [facility] today.  He is resolving some of his issues.  He now has Medicaid and is obtaining his HIV medications today.

## 2017-05-02 NOTE — ED Notes (Signed)
Pt left before vitals could be checked

## 2017-05-04 ENCOUNTER — Telehealth: Payer: Self-pay | Admitting: *Deleted

## 2017-05-04 MED ORDER — DULOXETINE HCL 60 MG PO CPEP: 60.0000 mg | ORAL_CAPSULE | Freq: Every day | ORAL | 0 refills | Status: DC

## 2017-05-04 NOTE — Telephone Encounter (Signed)
Patient walked into clinic requesting an Rx for cymbalta and tesselon pearles cough medicine. He is now in a "program" through the [employer] and he does have temporary housing. He also has Medicaid. [place] to [place], [place] and he approved a one month refill on these meds. Patient is aware that he will need to establish with psychiatry for the cymbalta. He was scheduled a lab and follow up MD appt with Dr. Ninetta LightsHatcher.

## 2017-05-04 NOTE — Telephone Encounter (Signed)
error 

## 2017-05-08 ENCOUNTER — Telehealth: Payer: Self-pay | Admitting: Licensed Clinical Social Worker

## 2017-05-08 ENCOUNTER — Other Ambulatory Visit: Payer: Medicaid Other

## 2017-05-08 ENCOUNTER — Telehealth: Payer: Self-pay | Admitting: *Deleted

## 2017-05-08 ENCOUNTER — Other Ambulatory Visit: Payer: Self-pay | Admitting: *Deleted

## 2017-05-08 DIAGNOSIS — Z113 Encounter for screening for infections with a predominantly sexual mode of transmission: Secondary | ICD-10-CM

## 2017-05-08 DIAGNOSIS — B20 Human immunodeficiency virus [HIV] disease: Secondary | ICD-10-CM

## 2017-05-08 LAB — CBC WITH DIFFERENTIAL/PLATELET
Basophils Absolute: 0 cells/uL (ref 0–200)
Basophils Relative: 0 %
Eosinophils Absolute: 144 cells/uL (ref 15–500)
Eosinophils Relative: 2 %
HEMATOCRIT: 43.7 % (ref 38.5–50.0)
Hemoglobin: 14.4 g/dL (ref 13.2–17.1)
LYMPHS PCT: 25 %
Lymphs Abs: 1800 cells/uL (ref 850–3900)
MCH: 31.9 pg (ref 27.0–33.0)
MCHC: 33 g/dL (ref 32.0–36.0)
MCV: 96.9 fL (ref 80.0–100.0)
MONO ABS: 1080 {cells}/uL — AB (ref 200–950)
MONOS PCT: 15 %
MPV: 10 fL (ref 7.5–12.5)
NEUTROS PCT: 58 %
Neutro Abs: 4176 cells/uL (ref 1500–7800)
Platelets: 205 10*3/uL (ref 140–400)
RBC: 4.51 MIL/uL (ref 4.20–5.80)
RDW: 13.2 % (ref 11.0–15.0)
WBC: 7.2 10*3/uL (ref 3.8–10.8)

## 2017-05-08 NOTE — Addendum Note (Signed)
Addended by: Tomasita MorrowKING, TAMMY K on: 05/08/2017 12:40 PM   Modules accepted: Orders

## 2017-05-08 NOTE — Telephone Encounter (Signed)
Attempted to contact patient to assess his receptiveness to scheduling appt and patient did not answer and there was no voice mail.  Will continue to reach out to patient.

## 2017-05-08 NOTE — Telephone Encounter (Signed)
Patient came into clinic for a lab appointment and asked to speak to a nurse. When I went upfront he was noticeably intoxicated and he said he had been drinking and that the treatment center he is in knows this. Advised patient he may be asked to leave if drinking and he stated, "no, as long as I'm drinking they know I need help." Advised patient I am not judging him, but concerned that the last two times he came to clinic he was intoxicated. I gave him a bag of food and a water and he stated that he was going to [facility]. He said he was already there and told they didn't have it. I called [facility] and they said it was there and ready for pick up. Patient also given a bus pass. Suggested to him that he schedule an appointment with Cordelia PenSherry our counselor. He did not commit to doing this.

## 2017-05-08 NOTE — Telephone Encounter (Signed)
Error/tkk  

## 2017-05-08 NOTE — Telephone Encounter (Signed)
Thanks Jackie 

## 2017-05-09 LAB — COMPLETE METABOLIC PANEL WITH GFR
ALBUMIN: 4.2 g/dL (ref 3.6–5.1)
ALK PHOS: 53 U/L (ref 40–115)
ALT: 13 U/L (ref 9–46)
AST: 19 U/L (ref 10–35)
BUN: 15 mg/dL (ref 7–25)
CALCIUM: 9.3 mg/dL (ref 8.6–10.3)
CO2: 20 mmol/L (ref 20–31)
CREATININE: 1.03 mg/dL (ref 0.70–1.33)
Chloride: 100 mmol/L (ref 98–110)
GFR, Est Non African American: 83 mL/min (ref >=60)
Glucose, Bld: 96 mg/dL (ref 65–99)
Potassium: 4.1 mmol/L (ref 3.5–5.3)
Sodium: 133 mmol/L — ABNORMAL LOW (ref 135–146)
Total Bilirubin: 0.6 mg/dL (ref 0.2–1.2)
Total Protein: 7.7 g/dL (ref 6.1–8.1)

## 2017-05-09 LAB — T-HELPER CELL (CD4) - (RCID CLINIC ONLY)
CD4 T CELL ABS: 220 /uL — AB (ref 400–2700)
CD4 T CELL HELPER: 12 % — AB (ref 33–55)

## 2017-05-10 ENCOUNTER — Telehealth: Payer: Self-pay | Admitting: Licensed Clinical Social Worker

## 2017-05-10 NOTE — Telephone Encounter (Signed)
I have attempted to contact the patient several times only for the phone to continue ringing, no option to leave message.  Hopefully I will be able to meet with this patient when he returns to the clinic next month.

## 2017-05-10 NOTE — Telephone Encounter (Signed)
Phone rang and was not able to leave voice message.  Patient returns to the clinic next month.  Hopefully I will be able to meet with him at that time.   Vergia AlbertsSherry Cohen Doleman, [facility]

## 2017-05-11 LAB — HIV-1 RNA QUANT-NO REFLEX-BLD
HIV 1 RNA QUANT: 46 {copies}/mL — AB
HIV-1 RNA QUANT, LOG: 1.66 {Log_copies}/mL — AB

## 2017-05-31 ENCOUNTER — Ambulatory Visit: Payer: Self-pay | Admitting: Family Medicine

## 2017-06-13 ENCOUNTER — Other Ambulatory Visit: Payer: Self-pay | Admitting: Infectious Diseases

## 2017-06-13 ENCOUNTER — Telehealth: Payer: Self-pay | Admitting: Lab

## 2017-06-13 NOTE — Telephone Encounter (Signed)
06/13/2017-Patient came by today.  He is now staying in a different place.[facility][phone], [address]
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
## 2017-06-14 ENCOUNTER — Other Ambulatory Visit: Payer: Self-pay | Admitting: *Deleted

## 2017-06-14 DIAGNOSIS — J441 Chronic obstructive pulmonary disease with (acute) exacerbation: Secondary | ICD-10-CM

## 2017-06-14 MED ORDER — MOMETASONE FURO-FORMOTEROL FUM 100-5 MCG/ACT IN AERO: 2.0000 | INHALATION_SPRAY | Freq: Two times a day (BID) | RESPIRATORY_TRACT | 5 refills | Status: DC

## 2017-06-20 ENCOUNTER — Ambulatory Visit (INDEPENDENT_AMBULATORY_CARE_PROVIDER_SITE_OTHER): Payer: Medicaid Other | Admitting: Infectious Diseases

## 2017-06-20 ENCOUNTER — Encounter: Payer: Self-pay | Admitting: Infectious Diseases

## 2017-06-20 VITALS — BP 175/120 | HR 92 | Temp 97.8°F | Wt 175.0 lb

## 2017-06-20 DIAGNOSIS — M25562 Pain in left knee: Secondary | ICD-10-CM | POA: Diagnosis not present

## 2017-06-20 DIAGNOSIS — G8929 Other chronic pain: Secondary | ICD-10-CM

## 2017-06-20 DIAGNOSIS — Z79899 Other long term (current) drug therapy: Secondary | ICD-10-CM | POA: Diagnosis not present

## 2017-06-20 DIAGNOSIS — M25561 Pain in right knee: Secondary | ICD-10-CM | POA: Diagnosis not present

## 2017-06-20 DIAGNOSIS — A630 Anogenital (venereal) warts: Secondary | ICD-10-CM

## 2017-06-20 DIAGNOSIS — Z113 Encounter for screening for infections with a predominantly sexual mode of transmission: Secondary | ICD-10-CM | POA: Diagnosis not present

## 2017-06-20 DIAGNOSIS — B182 Chronic viral hepatitis C: Secondary | ICD-10-CM

## 2017-06-20 DIAGNOSIS — M25551 Pain in right hip: Secondary | ICD-10-CM | POA: Diagnosis not present

## 2017-06-20 DIAGNOSIS — M25552 Pain in left hip: Secondary | ICD-10-CM | POA: Diagnosis not present

## 2017-06-20 DIAGNOSIS — Z21 Asymptomatic human immunodeficiency virus [HIV] infection status: Secondary | ICD-10-CM | POA: Diagnosis not present

## 2017-06-20 DIAGNOSIS — I10 Essential (primary) hypertension: Secondary | ICD-10-CM | POA: Insufficient documentation

## 2017-06-20 DIAGNOSIS — B20 Human immunodeficiency virus [HIV] disease: Secondary | ICD-10-CM

## 2017-06-20 MED ORDER — DRONABINOL 2.5 MG PO CAPS: ORAL_CAPSULE | ORAL | 4 refills | Status: DC

## 2017-06-20 MED ORDER — CELECOXIB 50 MG PO CAPS: 50.0000 mg | ORAL_CAPSULE | Freq: Two times a day (BID) | ORAL | 1 refills | Status: DC | PRN

## 2017-06-20 NOTE — Assessment & Plan Note (Signed)
Will get him in with PCP.  

## 2017-06-20 NOTE — Progress Notes (Signed)
   Subjective:    Patient ID: Steven [name], male    DOB: 06/26/1966, 51 y.o.   MRN: [id number]  HPI 51yo M with hx of HIV+ since (previously on Darunavir-cobi/descovey/tivicat), [facility]. Has been positive 1985. CVA 2010.  He had anal pap 06-2011 which showed ASCUS. [facility] but not recently.  [facility], Has gotten disability but still camping outside. Working on getting housing. Has lost ~ 20#. Having difficulty getting marinol. Has food.  Working on getting PCP.  BP has been high, [facility]. Attributes to using his inhaler prior.   tx for Hep C 2017, took rx for 12 weeks.    HIV 1 RNA Quant (copies/mL)  Date Value  05/08/2017 46 (H)  01/08/2017 <20 DETECTED (A)  09/13/2016 <20   CD4 T Cell Abs (/uL)  Date Value  05/08/2017 220 (L)  01/08/2017 220 (L)  09/13/2016 170 (L)    Review of Systems  Constitutional: Positive for appetite change, chills and unexpected weight change. Negative for fever.  Gastrointestinal: Negative for constipation and diarrhea.  Genitourinary: Negative for difficulty urinating.  Skin: Positive for rash.  Psychiatric/Behavioral: Negative for dysphoric mood.  has rash which he attributes to mosquito repellent.  Mood improved with cymbalta.  Having joint pain, would like rx for this.      Objective:   Physical Exam  Constitutional: He appears well-developed and well-nourished.  HENT:  Nose: Nose normal.  Mouth/Throat: No oropharyngeal exudate.  Eyes: Pupils are equal, round, and reactive to light. EOM are normal.  Neck: Neck supple.  Cardiovascular: Normal rate, regular rhythm and normal heart sounds.   Pulmonary/Chest: Effort normal and breath sounds normal.  Abdominal: Soft. Bowel sounds are normal. There is no tenderness. There is no rebound.  Musculoskeletal: He exhibits no edema.  Lymphadenopathy:    He has no cervical adenopathy.  Skin: Rash noted.    Psychiatric: He has a normal mood and affect.      Assessment & Plan:

## 2017-06-20 NOTE — Assessment & Plan Note (Signed)
Treated, cured 

## 2017-06-20 NOTE — Assessment & Plan Note (Signed)
Will give him rx for celbryx for joint pain. Advised we don't give narcotics He is looking for housing.  Offered/refused condoms.  Has gotten mening Continue his current art.  He has food.  rtc in 4-6 months.

## 2017-06-20 NOTE — Assessment & Plan Note (Signed)
Will work on getting [facility]

## 2017-07-20 ENCOUNTER — Encounter (HOSPITAL_COMMUNITY): Payer: Self-pay | Admitting: Emergency Medicine

## 2017-07-20 DIAGNOSIS — I10 Essential (primary) hypertension: Secondary | ICD-10-CM | POA: Diagnosis not present

## 2017-07-20 DIAGNOSIS — F17210 Nicotine dependence, cigarettes, uncomplicated: Secondary | ICD-10-CM | POA: Insufficient documentation

## 2017-07-20 DIAGNOSIS — J45909 Unspecified asthma, uncomplicated: Secondary | ICD-10-CM | POA: Diagnosis not present

## 2017-07-20 DIAGNOSIS — M79671 Pain in right foot: Secondary | ICD-10-CM | POA: Insufficient documentation

## 2017-07-20 DIAGNOSIS — B20 Human immunodeficiency virus [HIV] disease: Secondary | ICD-10-CM | POA: Diagnosis not present

## 2017-07-20 DIAGNOSIS — M79672 Pain in left foot: Secondary | ICD-10-CM | POA: Insufficient documentation

## 2017-07-20 DIAGNOSIS — Z760 Encounter for issue of repeat prescription: Secondary | ICD-10-CM | POA: Diagnosis not present

## 2017-07-20 DIAGNOSIS — J449 Chronic obstructive pulmonary disease, unspecified: Secondary | ICD-10-CM | POA: Insufficient documentation

## 2017-07-20 DIAGNOSIS — Z79899 Other long term (current) drug therapy: Secondary | ICD-10-CM | POA: Insufficient documentation

## 2017-07-20 NOTE — ED Notes (Signed)
Pt demanding a dose of all his meds including pain meds so he can lay down in the lobby and cover up and sleep tonight.  Pt appears intoxicated, continuous cursing during triage.

## 2017-07-20 NOTE — ED Triage Notes (Signed)
[place] [employer], pt c/o bilat feet pain, pt reports he has been walking all day. Pt does have work boot type shoes but was not wearing shoes on EMS arrival. +etoh

## 2017-07-21 ENCOUNTER — Emergency Department (HOSPITAL_COMMUNITY)
Admission: EM | Admit: 2017-07-21 | Discharge: 2017-07-21 | Disposition: A | Discharge status: Home / Self Care | Payer: Medicaid Other | Attending: Emergency Medicine | Admitting: Emergency Medicine

## 2017-07-21 DIAGNOSIS — M79672 Pain in left foot: Secondary | ICD-10-CM

## 2017-07-21 DIAGNOSIS — Z760 Encounter for issue of repeat prescription: Secondary | ICD-10-CM

## 2017-07-21 DIAGNOSIS — M79671 Pain in right foot: Secondary | ICD-10-CM

## 2017-07-21 MED ORDER — ALBUTEROL SULFATE HFA 108 (90 BASE) MCG/ACT IN AERS
2.0000 | INHALATION_SPRAY | Freq: Once | RESPIRATORY_TRACT | Status: AC
  Administered 2017-07-21: 2 via RESPIRATORY_TRACT
  Filled 2017-07-21: qty 6.7

## 2017-07-21 MED ORDER — METOPROLOL TARTRATE 25 MG PO TABS
50.0000 mg | ORAL_TABLET | Freq: Once | ORAL | Status: AC
  Administered 2017-07-21: 50 mg via ORAL
  Filled 2017-07-21: qty 2

## 2017-07-21 MED ORDER — GABAPENTIN 300 MG PO CAPS
300.0000 mg | ORAL_CAPSULE | Freq: Once | ORAL | Status: AC
  Administered 2017-07-21: 300 mg via ORAL
  Filled 2017-07-21: qty 1

## 2017-07-21 MED ORDER — BENZONATATE 100 MG PO CAPS
100.0000 mg | ORAL_CAPSULE | Freq: Once | ORAL | Status: AC
  Administered 2017-07-21: 100 mg via ORAL
  Filled 2017-07-21: qty 1

## 2017-07-21 NOTE — ED Notes (Signed)
Pt verbalized understanding of d/c instructions and has no further questions. Pt is stable, A&Ox4, VSS.  

## 2017-07-21 NOTE — Discharge Instructions (Signed)
It is very important that you take all of your medications daily without missed doses. Please follow up with your primary care provider for further discussion of your foot pain. Keep appointments with your infectious disease doctor as well. Return to ER for new or worsening symptoms, any additional concerns.

## 2017-07-21 NOTE — ED Provider Notes (Signed)
[facility]-EMERGENCY DEPT Provider Note   CSN: [id number] Arrival date & time: 07/20/17  2227     History   Chief Complaint Chief Complaint  Patient presents with  . Foot Pain    HPI Steven [name] is a 51 y.o. male.  The history is provided by the patient and medical records. No language interpreter was used.   Steven [name] is a 51 y.o. male  with a PMH of AIDS, HTN who presents to the Emergency Department. Per triage, patient reported bilateral foot pain. Upon my evaluation, patient states that he is here because he needs a dose of all of his home medications. He states that he has been out of his medications for the last week and cannot get them filled due to financial reasons. When asked about his foot pain, he endorses walking all day and his feet hurting. This has gotten better while in ED. He also endorses "neuropathy" pain to bilateral lower extremities. Denies redness or open wounds to the feet. No numbness, tingling or weakness, "just pain". No fever or chills. No medications taken prior to arrival for his foot pain.   Past Medical History:  Diagnosis Date  . AIDS (HCC) 1985   . Asthma   . Cerebral hemorrhage (HCC) 2000  . Hemophilia (HCC)   . Hepatitis C    never treated  . HIV (human immunodeficiency virus infection) [facility])     Patient Active Problem List   Diagnosis Date Noted  . HTN (hypertension) 06/20/2017  . Condyloma acuminatum of perianal region 01/08/2017  . Homeless 01/08/2017  . Urinary hesitancy 01/08/2017  . COPD exacerbation (HCC) 01/12/2016  . Depression 08/04/2015  . Hepatitis C 08/02/2015  . HIV disease (HCC) 07/28/2015  . Other pancytopenia (HCC) 07/28/2015  . Esophageal tear   . Gastrointestinal hemorrhage with melena   . GI bleed 07/26/2015  . Numbness on left side 07/15/2013    Past Surgical History:  Procedure Laterality Date  . ESOPHAGOGASTRODUODENOSCOPY (EGD) WITH PROPOFOL N/A 07/27/2015   Procedure: ESOPHAGOGASTRODUODENOSCOPY  (EGD) WITH PROPOFOL;  Surgeon: Hilarie Fredrickson, MD;  Location: [facility] ENDOS[facility];  Service: Endoscopy;  Laterality: N/A;  . HERNIA REPAIR    . laporotomy    . MANDIBLE SURGERY         Home Medications    Prior to Admission medications   Medication Sig Start Date End Date Taking? Authorizing Provider  albuterol (PROVENTIL HFA;VENTOLIN HFA) 108 (90 Base) MCG/ACT inhaler Inhale 1-2 puffs into the lungs every 6 (six) hours as needed for wheezing or shortness of breath. 01/12/16   Ginnie Smart, MD  benzonatate (TESSALON) 100 MG capsule TAKE 1 CAPSULE(100 MG) BY MOUTH THREE TIMES DAILY AS NEEDED FOR COUGH 11/26/16   Elpidio Anis, PA-C  celecoxib (CELEBREX) 50 MG capsule Take 1 capsule (50 mg total) by mouth 2 (two) times daily as needed for pain. 06/20/17   Ginnie Smart, MD  chlorhexidine (PERIDEX) 0.12 % solution Use as directed 10 mLs in the mouth or throat 4 (four) times daily. Swish and spit 06/15/16   Derwood Kaplan, MD  darunavir-cobicistat (PREZCOBIX) 800-150 MG tablet Take 1 tablet by mouth daily with breakfast. 05/02/17   Kuppelweiser, Cassie L, RPH  diclofenac sodium (VOLTAREN) 1 % GEL Apply 2 g topically 4 (four) times daily. 07/24/16   Palumbo, April, MD  diphenhydrAMINE (BENADRYL) 25 mg capsule Take 25 mg by mouth every 6 (six) hours as needed for allergies.    [provider]  dolutegravir (TIVICAY) 50 MG  tablet Take 1 tablet (50 mg total) by mouth daily. 05/02/17   Kuppelweiser, Cassie L, RPH  dronabinol (MARINOL) 2.5 MG capsule TAKE ONE CAPSULE BY MOUTH TWICE DAILY BEFORE A MEAL 06/20/17   Ginnie Smart, MD  DULoxetine (CYMBALTA) 60 MG capsule TAKE 1 CAPSULE BY MOUTH DAILY 06/13/17   Ginnie Smart, MD  emtricitabine-tenofovir AF (DESCOVY) 200-25 MG tablet Take 1 tablet by mouth daily. 05/02/17   Kuppelweiser, Cassie L, RPH  gabapentin (NEURONTIN) 300 MG capsule Take 1 capsule (300 mg total) by mouth 3 (three) times daily. 11/26/16   Elpidio Anis, PA-C    hydroxypropyl methylcellulose (ISOPTO TEARS) 2.5 % ophthalmic solution Place 1 drop into both eyes 3 (three) times daily as needed for dry eyes.    [provider]  meloxicam (MOBIC) 7.5 MG tablet Take 1 tablet (7.5 mg total) by mouth daily. 11/26/16   Elpidio Anis, PA-C  metoprolol (LOPRESSOR) 50 MG tablet Take 50 mg by mouth 2 (two) times daily. 12/07/16   [provider]  mometasone-formoterol (DULERA) 100-5 MCG/ACT AERO Inhale 2 puffs into the lungs 2 (two) times daily. 06/14/17   Ginnie Smart, MD  Multiple Vitamin (MULTIVITAMIN WITH MINERALS) TABS tablet Take 1 tablet by mouth daily.    [provider]  oxyCODONE (ROXICODONE) 5 MG immediate release tablet Take 1 tablet (5 mg total) by mouth every 6 (six) hours as needed for severe pain. Patient not taking: Reported on 06/20/2017 11/26/16   Elpidio Anis, PA-C  terazosin (HYTRIN) 1 MG capsule Take 1 capsule (1 mg total) by mouth at bedtime. Patient not taking: Reported on 06/20/2017 11/26/16   Elpidio Anis, PA-C    Family History Family History  Problem Relation Age of Onset  . CAD Mother   . Stroke Mother     Social History Social History  Substance Use Topics  . Smoking status: Current Every Day Smoker    Packs/day: 2.00    Years: 38.00    Types: Cigarettes  . Smokeless tobacco: Never Used  . Alcohol use 0.0 oz/week     Comment: 6 pack per week per patient      Allergies   Aspirin   Review of Systems Review of Systems  Musculoskeletal: Positive for arthralgias.  All other systems reviewed and are negative.    Physical Exam Updated Vital Signs BP (!) 142/112   Pulse 85   Temp 98 F (36.7 C) (Oral)   Resp 18   Ht 5\' 11"  (1.803 m)   Wt 83.5 kg (184 lb)   SpO2 100%   BMI 25.66 kg/m   Physical Exam  Constitutional: He is oriented to person, place, and time. He appears well-developed and well-nourished. No distress.  HENT:  Head: Normocephalic and atraumatic.   Cardiovascular: Normal rate, regular rhythm and normal heart sounds.   No murmur heard. Pulmonary/Chest: Effort normal and breath sounds normal. No respiratory distress.  Abdominal: Soft. He exhibits no distension. There is no tenderness.  Musculoskeletal: Normal range of motion. He exhibits no edema, tenderness or deformity.  Neurological: He is alert and oriented to person, place, and time.  Bilateral lower extremities neurovascularly intact.   Skin: Skin is warm and dry.  Bilateral feet with no erythema, warmth or open wounds.  Nursing note and vitals reviewed.    ED Treatments / Results  Labs (all labs ordered are listed, but only abnormal results are displayed) Labs Reviewed - No data to display  EKG  EKG Interpretation None  Radiology No results found.  Procedures Procedures (including critical care time)  Medications Ordered in ED Medications  albuterol (PROVENTIL HFA;VENTOLIN HFA) 108 (90 Base) MCG/ACT inhaler 2 puff (2 puffs Inhalation Given 07/21/17 0517)  benzonatate (TESSALON) capsule 100 mg (100 mg Oral Given 07/21/17 0513)  metoprolol tartrate (LOPRESSOR) tablet 50 mg (50 mg Oral Given 07/21/17 0513)  gabapentin (NEURONTIN) capsule 300 mg (300 mg Oral Given 07/21/17 0513)     Initial Impression / Assessment and Plan / ED Course  I have reviewed the triage vital signs and the nursing notes.  Pertinent labs & imaging results that were available during my care of the patient were reviewed by me and considered in my medical decision making (see chart for details).    Steven [name] is a 51 y.o. male . Initially, he presented to ED complaining of bilateral foot pain. NVI on exam. No bony tenderness. No signs of infection. Follow up with PCP for foot pain. Upon my evaluation, patient actually states that he is here in ED today for a dose of all his home medications as he cannot get them filled due to financial reasons. He does not need rx as he has  prescriptions waiting for him at the pharmacy. Home meds he is requesting ordered. Patient requesting 10mg  oxycodone specifically for his pain. He states this is a home medication. [place] Controlled Substance Database consulted with no prescriptions for oxycodone. Discussed that as this is not really a home medication, will not order with the others. Evaluation does not show pathology that would require ongoing emergent intervention or inpatient treatment. Home care and return precautions discussed. Stressed importance of PCP follow up and medication adherence. All questions answered.    Final Clinical Impressions(s) / ED Diagnoses   Final diagnoses:  Bilateral foot pain  Encounter for medication refill    New Prescriptions Discharge Medication List as of 07/21/2017  5:23 AM       Celia Gibbons, Chase Picket, PA-C 07/21/17 3013    Loren Racer, MD 07/27/17 [phone]

## 2017-07-21 NOTE — ED Notes (Signed)
Pt appears to be sleeping on bench, pt voided in floor and all over benches in lobby, pt given paper scrubs. States he just wants to sleep

## 2017-07-21 NOTE — ED Notes (Signed)
Pt in lobby yelling, and cursing at security and [employer].

## 2017-08-01 ENCOUNTER — Other Ambulatory Visit: Payer: Self-pay | Admitting: *Deleted

## 2017-08-04 ENCOUNTER — Other Ambulatory Visit: Payer: Self-pay | Admitting: Infectious Diseases

## 2017-08-09 ENCOUNTER — Telehealth: Payer: Self-pay | Admitting: *Deleted

## 2017-08-09 NOTE — Telephone Encounter (Signed)
Patient left message in triage requesting 1) refill of pain medication and marinol (5 month supply written 7/18), 2) prescription of ensure, enough for 2 cans per day.  Please advise on refills of all 3 medications. Andree CossHowell, Michelle M, RN

## 2017-08-15 ENCOUNTER — Other Ambulatory Visit: Payer: Self-pay | Admitting: *Deleted

## 2017-08-15 DIAGNOSIS — M25562 Pain in left knee: Principal | ICD-10-CM

## 2017-08-15 DIAGNOSIS — G8929 Other chronic pain: Secondary | ICD-10-CM

## 2017-08-15 DIAGNOSIS — M25552 Pain in left hip: Principal | ICD-10-CM

## 2017-08-15 DIAGNOSIS — M25561 Pain in right knee: Principal | ICD-10-CM

## 2017-08-15 DIAGNOSIS — Z21 Asymptomatic human immunodeficiency virus [HIV] infection status: Secondary | ICD-10-CM

## 2017-08-15 DIAGNOSIS — M25551 Pain in right hip: Principal | ICD-10-CM

## 2017-08-15 MED ORDER — ENSURE PO LIQD: 237.0000 mL | Freq: Two times a day (BID) | ORAL | 5 refills | Status: DC

## 2017-08-15 MED ORDER — CELECOXIB 50 MG PO CAPS: 50.0000 mg | ORAL_CAPSULE | Freq: Two times a day (BID) | ORAL | 1 refills | Status: DC | PRN

## 2017-08-15 MED ORDER — DRONABINOL 2.5 MG PO CAPS: ORAL_CAPSULE | ORAL | 4 refills | Status: DC

## 2017-08-15 NOTE — Telephone Encounter (Signed)
Ok to refill   thanks

## 2017-08-15 NOTE — Telephone Encounter (Signed)
Medications sent in [facility]([place] [address]) per Dr Ninetta LightsHatcher.  Ensure rx faxed to [facility].

## 2017-09-05 ENCOUNTER — Other Ambulatory Visit: Payer: Self-pay

## 2017-09-06 ENCOUNTER — Other Ambulatory Visit: Payer: Self-pay | Admitting: Infectious Diseases

## 2017-09-19 ENCOUNTER — Telehealth: Payer: Self-pay

## 2017-09-19 ENCOUNTER — Ambulatory Visit: Payer: Self-pay | Admitting: Infectious Diseases

## 2017-09-19 NOTE — Telephone Encounter (Signed)
Per verbal order by Dr. Ninetta LightsHatcher he would like to discuss with the pt about Dronabinol he would like for the prescription on this medication to be on hold until he meets with the pt.  Lorenso CourierJose L Maldonado, [place]

## 2017-10-01 ENCOUNTER — Other Ambulatory Visit: Payer: Self-pay

## 2017-10-09 ENCOUNTER — Ambulatory Visit: Payer: Self-pay

## 2017-11-10 ENCOUNTER — Other Ambulatory Visit: Payer: Self-pay | Admitting: Infectious Diseases

## 2017-11-10 DIAGNOSIS — M25551 Pain in right hip: Principal | ICD-10-CM

## 2017-11-10 DIAGNOSIS — M25552 Pain in left hip: Principal | ICD-10-CM

## 2017-11-10 DIAGNOSIS — G8929 Other chronic pain: Secondary | ICD-10-CM

## 2017-11-10 DIAGNOSIS — M25562 Pain in left knee: Principal | ICD-10-CM

## 2017-11-10 DIAGNOSIS — M25561 Pain in right knee: Principal | ICD-10-CM

## 2017-11-10 DIAGNOSIS — Z21 Asymptomatic human immunodeficiency virus [HIV] infection status: Secondary | ICD-10-CM

## 2017-11-23 ENCOUNTER — Encounter (HOSPITAL_COMMUNITY): Payer: Self-pay | Admitting: Internal Medicine

## 2017-11-23 ENCOUNTER — Other Ambulatory Visit: Payer: Self-pay

## 2017-11-23 ENCOUNTER — Inpatient Hospital Stay (HOSPITAL_COMMUNITY)
Admission: EM | Admit: 2017-11-23 | Discharge: 2017-11-25 | DRG: 896 | Disposition: A | Discharge status: Home / Self Care | Payer: Medicaid Other | Attending: Internal Medicine | Admitting: Internal Medicine

## 2017-11-23 ENCOUNTER — Inpatient Hospital Stay (HOSPITAL_COMMUNITY): Payer: Medicaid Other

## 2017-11-23 ENCOUNTER — Emergency Department (HOSPITAL_COMMUNITY): Payer: Medicaid Other

## 2017-11-23 DIAGNOSIS — B20 Human immunodeficiency virus [HIV] disease: Secondary | ICD-10-CM | POA: Diagnosis present

## 2017-11-23 DIAGNOSIS — M25559 Pain in unspecified hip: Secondary | ICD-10-CM | POA: Diagnosis present

## 2017-11-23 DIAGNOSIS — J449 Chronic obstructive pulmonary disease, unspecified: Secondary | ICD-10-CM | POA: Diagnosis present

## 2017-11-23 DIAGNOSIS — F419 Anxiety disorder, unspecified: Secondary | ICD-10-CM | POA: Diagnosis present

## 2017-11-23 DIAGNOSIS — D66 Hereditary factor VIII deficiency: Secondary | ICD-10-CM | POA: Diagnosis present

## 2017-11-23 DIAGNOSIS — Z886 Allergy status to analgesic agent status: Secondary | ICD-10-CM

## 2017-11-23 DIAGNOSIS — F1290 Cannabis use, unspecified, uncomplicated: Secondary | ICD-10-CM | POA: Diagnosis not present

## 2017-11-23 DIAGNOSIS — Z87828 Personal history of other (healed) physical injury and trauma: Secondary | ICD-10-CM

## 2017-11-23 DIAGNOSIS — F14929 Cocaine use, unspecified with intoxication, unspecified: Secondary | ICD-10-CM | POA: Diagnosis not present

## 2017-11-23 DIAGNOSIS — R748 Abnormal levels of other serum enzymes: Secondary | ICD-10-CM | POA: Insufficient documentation

## 2017-11-23 DIAGNOSIS — Z590 Homelessness: Secondary | ICD-10-CM

## 2017-11-23 DIAGNOSIS — Y902 Blood alcohol level of 40-59 mg/100 ml: Secondary | ICD-10-CM | POA: Diagnosis present

## 2017-11-23 DIAGNOSIS — I10 Essential (primary) hypertension: Secondary | ICD-10-CM | POA: Diagnosis present

## 2017-11-23 DIAGNOSIS — F15929 Other stimulant use, unspecified with intoxication, unspecified: Secondary | ICD-10-CM | POA: Diagnosis not present

## 2017-11-23 DIAGNOSIS — F11929 Opioid use, unspecified with intoxication, unspecified: Secondary | ICD-10-CM | POA: Diagnosis not present

## 2017-11-23 DIAGNOSIS — K529 Noninfective gastroenteritis and colitis, unspecified: Secondary | ICD-10-CM | POA: Diagnosis not present

## 2017-11-23 DIAGNOSIS — N179 Acute kidney failure, unspecified: Secondary | ICD-10-CM | POA: Diagnosis present

## 2017-11-23 DIAGNOSIS — F12929 Cannabis use, unspecified with intoxication, unspecified: Secondary | ICD-10-CM | POA: Diagnosis not present

## 2017-11-23 DIAGNOSIS — E860 Dehydration: Secondary | ICD-10-CM | POA: Diagnosis present

## 2017-11-23 DIAGNOSIS — Z720 Tobacco use: Secondary | ICD-10-CM | POA: Diagnosis not present

## 2017-11-23 DIAGNOSIS — M6282 Rhabdomyolysis: Secondary | ICD-10-CM | POA: Diagnosis present

## 2017-11-23 DIAGNOSIS — G92 Toxic encephalopathy: Secondary | ICD-10-CM | POA: Diagnosis not present

## 2017-11-23 DIAGNOSIS — T50995A Adverse effect of other drugs, medicaments and biological substances, initial encounter: Secondary | ICD-10-CM | POA: Diagnosis present

## 2017-11-23 DIAGNOSIS — F10129 Alcohol abuse with intoxication, unspecified: Secondary | ICD-10-CM | POA: Diagnosis not present

## 2017-11-23 DIAGNOSIS — F17210 Nicotine dependence, cigarettes, uncomplicated: Secondary | ICD-10-CM | POA: Diagnosis present

## 2017-11-23 DIAGNOSIS — B1920 Unspecified viral hepatitis C without hepatic coma: Secondary | ICD-10-CM | POA: Diagnosis present

## 2017-11-23 DIAGNOSIS — I69354 Hemiplegia and hemiparesis following cerebral infarction affecting left non-dominant side: Secondary | ICD-10-CM | POA: Diagnosis not present

## 2017-11-23 DIAGNOSIS — R0602 Shortness of breath: Secondary | ICD-10-CM

## 2017-11-23 DIAGNOSIS — Z21 Asymptomatic human immunodeficiency virus [HIV] infection status: Secondary | ICD-10-CM | POA: Diagnosis present

## 2017-11-23 DIAGNOSIS — G8929 Other chronic pain: Secondary | ICD-10-CM | POA: Diagnosis present

## 2017-11-23 DIAGNOSIS — R4182 Altered mental status, unspecified: Secondary | ICD-10-CM | POA: Diagnosis present

## 2017-11-23 DIAGNOSIS — Z8249 Family history of ischemic heart disease and other diseases of the circulatory system: Secondary | ICD-10-CM

## 2017-11-23 DIAGNOSIS — F1490 Cocaine use, unspecified, uncomplicated: Secondary | ICD-10-CM | POA: Diagnosis not present

## 2017-11-23 DIAGNOSIS — I2510 Atherosclerotic heart disease of native coronary artery without angina pectoris: Secondary | ICD-10-CM | POA: Diagnosis not present

## 2017-11-23 DIAGNOSIS — M549 Dorsalgia, unspecified: Secondary | ICD-10-CM | POA: Diagnosis present

## 2017-11-23 DIAGNOSIS — F1590 Other stimulant use, unspecified, uncomplicated: Secondary | ICD-10-CM | POA: Diagnosis not present

## 2017-11-23 DIAGNOSIS — F1190 Opioid use, unspecified, uncomplicated: Secondary | ICD-10-CM | POA: Diagnosis not present

## 2017-11-23 DIAGNOSIS — M25539 Pain in unspecified wrist: Secondary | ICD-10-CM | POA: Diagnosis present

## 2017-11-23 DIAGNOSIS — Z8673 Personal history of transient ischemic attack (TIA), and cerebral infarction without residual deficits: Secondary | ICD-10-CM | POA: Diagnosis not present

## 2017-11-23 DIAGNOSIS — F19129 Other psychoactive substance abuse with intoxication, unspecified: Secondary | ICD-10-CM | POA: Diagnosis not present

## 2017-11-23 DIAGNOSIS — F19120 Other psychoactive substance abuse with intoxication, uncomplicated: Secondary | ICD-10-CM | POA: Diagnosis present

## 2017-11-23 DIAGNOSIS — Z23 Encounter for immunization: Secondary | ICD-10-CM | POA: Diagnosis not present

## 2017-11-23 DIAGNOSIS — X58XXXA Exposure to other specified factors, initial encounter: Secondary | ICD-10-CM | POA: Diagnosis present

## 2017-11-23 DIAGNOSIS — Z8619 Personal history of other infectious and parasitic diseases: Secondary | ICD-10-CM

## 2017-11-23 DIAGNOSIS — F19929 Other psychoactive substance use, unspecified with intoxication, unspecified: Secondary | ICD-10-CM | POA: Diagnosis present

## 2017-11-23 LAB — RAPID URINE DRUG SCREEN, HOSP PERFORMED
Amphetamines: POSITIVE — AB
Barbiturates: NOT DETECTED
Benzodiazepines: NOT DETECTED
Cocaine: POSITIVE — AB
OPIATES: POSITIVE — AB
Tetrahydrocannabinol: POSITIVE — AB

## 2017-11-23 LAB — BASIC METABOLIC PANEL
ANION GAP: 15 (ref 5–15)
BUN: 19 mg/dL (ref 6–20)
CALCIUM: 9 mg/dL (ref 8.9–10.3)
CO2: 19 mmol/L — AB (ref 22–32)
Chloride: 97 mmol/L — ABNORMAL LOW (ref 101–111)
Creatinine, Ser: 1.75 mg/dL — ABNORMAL HIGH (ref 0.61–1.24)
GFR, EST AFRICAN AMERICAN: 50 mL/min — AB (ref >60)
GFR, EST NON AFRICAN AMERICAN: 43 mL/min — AB (ref >60)
Glucose, Bld: 78 mg/dL (ref 65–99)
Potassium: 3.6 mmol/L (ref 3.5–5.1)
Sodium: 131 mmol/L — ABNORMAL LOW (ref 135–145)

## 2017-11-23 LAB — CBC WITH DIFFERENTIAL/PLATELET
BASOS ABS: 0 10*3/uL (ref 0.0–0.1)
BASOS PCT: 0 %
Eosinophils Absolute: 0.2 10*3/uL (ref 0.0–0.7)
Eosinophils Relative: 2 %
HEMATOCRIT: 37.9 % — AB (ref 39.0–52.0)
Hemoglobin: 13.6 g/dL (ref 13.0–17.0)
Lymphocytes Relative: 14 %
Lymphs Abs: 1.6 10*3/uL (ref 0.7–4.0)
MCH: 32.6 pg (ref 26.0–34.0)
MCHC: 35.9 g/dL (ref 30.0–36.0)
MCV: 90.9 fL (ref 78.0–100.0)
MONO ABS: 1.2 10*3/uL — AB (ref 0.1–1.0)
Monocytes Relative: 11 %
NEUTROS ABS: 8.2 10*3/uL — AB (ref 1.7–7.7)
Neutrophils Relative %: 73 %
PLATELETS: 167 10*3/uL (ref 150–400)
RBC: 4.17 MIL/uL — ABNORMAL LOW (ref 4.22–5.81)
RDW: 12.7 % (ref 11.5–15.5)
WBC: 11.1 10*3/uL — ABNORMAL HIGH (ref 4.0–10.5)

## 2017-11-23 LAB — URINALYSIS, ROUTINE W REFLEX MICROSCOPIC
BILIRUBIN URINE: NEGATIVE
Glucose, UA: NEGATIVE mg/dL
KETONES UR: 15 mg/dL — AB
Leukocytes, UA: NEGATIVE
Nitrite: NEGATIVE
PROTEIN: 30 mg/dL — AB
Specific Gravity, Urine: >1.03 — ABNORMAL HIGH (ref 1.005–1.030)
pH: 5.5 (ref 5.0–8.0)

## 2017-11-23 LAB — URINALYSIS, MICROSCOPIC (REFLEX)
RBC / HPF: NONE SEEN RBC/hpf (ref 0–5)
SQUAMOUS EPITHELIAL / LPF: NONE SEEN

## 2017-11-23 LAB — MAGNESIUM: MAGNESIUM: 2.2 mg/dL (ref 1.7–2.4)

## 2017-11-23 LAB — CK
Total CK: 3342 U/L — ABNORMAL HIGH (ref 49–397)
Total CK: 7800 U/L — ABNORMAL HIGH (ref 49–397)

## 2017-11-23 LAB — ETHANOL: ALCOHOL ETHYL (B): 57 mg/dL — AB (ref <10)

## 2017-11-23 LAB — HEPATIC FUNCTION PANEL
ALK PHOS: 51 U/L (ref 38–126)
ALT: 41 U/L (ref 17–63)
AST: 121 U/L — ABNORMAL HIGH (ref 15–41)
Albumin: 4.4 g/dL (ref 3.5–5.0)
BILIRUBIN DIRECT: 0.2 mg/dL (ref 0.1–0.5)
BILIRUBIN INDIRECT: 0.8 mg/dL (ref 0.3–0.9)
Total Bilirubin: 1 mg/dL (ref 0.3–1.2)
Total Protein: 7.7 g/dL (ref 6.5–8.1)

## 2017-11-23 LAB — I-STAT TROPONIN, ED: Troponin i, poc: 0 ng/mL (ref 0.00–0.08)

## 2017-11-23 LAB — ACETAMINOPHEN LEVEL: Acetaminophen (Tylenol), Serum: <10 ug/mL — ABNORMAL LOW (ref 10–30)

## 2017-11-23 LAB — BRAIN NATRIURETIC PEPTIDE: B NATRIURETIC PEPTIDE 5: 28.9 pg/mL (ref 0.0–100.0)

## 2017-11-23 LAB — SALICYLATE LEVEL: Salicylate Lvl: <7 mg/dL (ref 2.8–30.0)

## 2017-11-23 MED ORDER — SODIUM CHLORIDE 0.9 % IV SOLN
INTRAVENOUS | Status: DC
  Administered 2017-11-23 – 2017-11-24 (×3): via INTRAVENOUS

## 2017-11-23 MED ORDER — ENSURE ENLIVE PO LIQD
237.0000 mL | Freq: Two times a day (BID) | ORAL | Status: DC
  Administered 2017-11-24 – 2017-11-25 (×2): 237 mL via ORAL

## 2017-11-23 MED ORDER — DARUNAVIR-COBICISTAT 800-150 MG PO TABS
1.0000 | ORAL_TABLET | Freq: Every day | ORAL | Status: DC
  Administered 2017-11-23 – 2017-11-25 (×2): 1 via ORAL
  Filled 2017-11-23 (×2): qty 1

## 2017-11-23 MED ORDER — FOLIC ACID 1 MG PO TABS
1.0000 mg | ORAL_TABLET | Freq: Once | ORAL | Status: AC
  Administered 2017-11-23: 1 mg via ORAL
  Filled 2017-11-23: qty 1

## 2017-11-23 MED ORDER — EMTRICITABINE-TENOFOVIR AF 200-25 MG PO TABS
1.0000 | ORAL_TABLET | Freq: Every day | ORAL | Status: DC
  Administered 2017-11-23 – 2017-11-25 (×3): 1 via ORAL
  Filled 2017-11-23 (×3): qty 1

## 2017-11-23 MED ORDER — THIAMINE HCL 100 MG/ML IJ SOLN
100.0000 mg | Freq: Once | INTRAMUSCULAR | Status: AC
  Administered 2017-11-23: 100 mg via INTRAVENOUS
  Filled 2017-11-23: qty 2

## 2017-11-23 MED ORDER — MOMETASONE FURO-FORMOTEROL FUM 100-5 MCG/ACT IN AERO
2.0000 | INHALATION_SPRAY | Freq: Two times a day (BID) | RESPIRATORY_TRACT | Status: DC
  Administered 2017-11-24 – 2017-11-25 (×3): 2 via RESPIRATORY_TRACT
  Filled 2017-11-23: qty 8.8

## 2017-11-23 MED ORDER — DOLUTEGRAVIR SODIUM 50 MG PO TABS
50.0000 mg | ORAL_TABLET | Freq: Every day | ORAL | Status: DC
  Administered 2017-11-23 – 2017-11-25 (×3): 50 mg via ORAL
  Filled 2017-11-23 (×3): qty 1

## 2017-11-23 MED ORDER — ENOXAPARIN SODIUM 40 MG/0.4ML ~~LOC~~ SOLN
40.0000 mg | SUBCUTANEOUS | Status: DC
  Administered 2017-11-23 – 2017-11-24 (×2): 40 mg via SUBCUTANEOUS
  Filled 2017-11-23 (×2): qty 0.4

## 2017-11-23 MED ORDER — SODIUM CHLORIDE 0.9 % IV BOLUS (SEPSIS)
1000.0000 mL | Freq: Once | INTRAVENOUS | Status: AC
  Administered 2017-11-23: 1000 mL via INTRAVENOUS

## 2017-11-23 MED ORDER — HYPROMELLOSE (GONIOSCOPIC) 2.5 % OP SOLN: 1.0000 [drp] | Freq: Three times a day (TID) | OPHTHALMIC | Status: DC | PRN

## 2017-11-23 NOTE — ED Provider Notes (Signed)
[facility] EMERGENCY DEPARTMENT Provider Note   CSN: [id number] Arrival date & time: 11/23/17  0505     History   Chief Complaint Chief Complaint  Patient presents with  . Panic Attack  . Chest Pain    HPI   Blood pressure 113/81, pulse 75, temperature 98.7 F (37.1 C), temperature source Oral, resp. rate 20, SpO2 97 %.  Garlin Batdorf is a 51 y.o. male with past medical history significant for AIDS (last CD4 count to 20 in June of this year), hemorrhagic CVA, COPD brought in by EMS for shortness of breath, left-sided facial paresthesia, left-sided chest pain radiating to the right.  He endorses a productive cough.  He denies fever but endorses diaphoresis.  Patient is an active daily smoker, he states that he is compliant with his HIV medications and his Bactrim DS.  He denies any weakness, difficulty talking about his baseline, difficulty walking, nausea vomiting.  He states he has chronic diarrhea.  He also endorses headache on review of systems.  Difficulty communicating, unclear if this is his baseline aphasia status post CVA.  Patient states that he drank 2 beers before he came.  He states that he has had DTs in the past, he states that he does not drink daily.   Past Medical History:  Diagnosis Date  . AIDS (HCC) 1985   . Asthma   . Cerebral hemorrhage (HCC) 2000  . Hemophilia (HCC)   . Hepatitis C    never treated  . HIV (human immunodeficiency virus infection) [facility])     Patient Active Problem List   Diagnosis Date Noted  . Intoxication by drug (HCC) 11/23/2017  . HTN (hypertension) 06/20/2017  . Condyloma acuminatum of perianal region 01/08/2017  . Homeless 01/08/2017  . Urinary hesitancy 01/08/2017  . COPD exacerbation (HCC) 01/12/2016  . Depression 08/04/2015  . Hepatitis C 08/02/2015  . HIV disease (HCC) 07/28/2015  . Other pancytopenia (HCC) 07/28/2015  . Esophageal tear   . Gastrointestinal hemorrhage with melena   . GI bleed  07/26/2015  . Numbness on left side 07/15/2013    Past Surgical History:  Procedure Laterality Date  . ESOPHAGOGASTRODUODENOSCOPY (EGD) WITH PROPOFOL N/A 07/27/2015   Procedure: ESOPHAGOGASTRODUODENOSCOPY (EGD) WITH PROPOFOL;  Surgeon: Hilarie Fredrickson, MD;  Location: [facility];  Service: Endoscopy;  Laterality: N/A;  . HERNIA REPAIR    . laporotomy    . MANDIBLE SURGERY         Home Medications    Prior to Admission medications   Medication Sig Start Date End Date Taking? Authorizing Provider  albuterol (PROVENTIL HFA;VENTOLIN HFA) 108 (90 Base) MCG/ACT inhaler Inhale 1-2 puffs into the lungs every 6 (six) hours as needed for wheezing or shortness of breath. 01/12/16   Ginnie Smart, MD  benzonatate (TESSALON) 100 MG capsule TAKE 1 CAPSULE(100 MG) BY MOUTH THREE TIMES DAILY AS NEEDED FOR COUGH 11/26/16   Elpidio Anis, PA-C  celecoxib (CELEBREX) 50 MG capsule TAKE 1 CAPSULE(50 MG) BY MOUTH TWICE DAILY AS NEEDED FOR PAIN 11/13/17   Ginnie Smart, MD  chlorhexidine (PERIDEX) 0.12 % solution Use as directed 10 mLs in the mouth or throat 4 (four) times daily. Swish and spit 06/15/16   Derwood Kaplan, MD  darunavir-cobicistat (PREZCOBIX) 800-150 MG tablet Take 1 tablet by mouth daily with breakfast. 05/02/17   Kuppelweiser, Cassie L, RPH-CPP  diclofenac sodium (VOLTAREN) 1 % GEL Apply 2 g topically 4 (four) times daily. 07/24/16   Palumbo, April, MD  diphenhydrAMINE (BENADRYL) 25 mg capsule Take 25 mg by mouth every 6 (six) hours as needed for allergies.    [provider]  dolutegravir (TIVICAY) 50 MG tablet Take 1 tablet (50 mg total) by mouth daily. 05/02/17   Kuppelweiser, Cassie L, RPH-CPP  dronabinol (MARINOL) 2.5 MG capsule TAKE ONE CAPSULE BY MOUTH TWICE DAILY BEFORE A MEAL 08/15/17   Ginnie SmartHatcher, Jeffrey C, MD  DULoxetine (CYMBALTA) 60 MG capsule TAKE 1 CAPSULE BY MOUTH DAILY 06/13/17   Ginnie SmartHatcher, Jeffrey C, MD  emtricitabine-tenofovir AF (DESCOVY) 200-25 MG tablet Take 1  tablet by mouth daily. 05/02/17   Kuppelweiser, Cassie L, RPH-CPP  ENSURE (ENSURE) Take 237 mLs by mouth 2 (two) times daily between meals. Do not take with medications. 08/15/17   Ginnie SmartHatcher, Jeffrey C, MD  gabapentin (NEURONTIN) 300 MG capsule Take 1 capsule (300 mg total) by mouth 3 (three) times daily. 11/26/16   Elpidio AnisUpstill, Shari, PA-C  hydroxypropyl methylcellulose (ISOPTO TEARS) 2.5 % ophthalmic solution Place 1 drop into both eyes 3 (three) times daily as needed for dry eyes.    [provider]  meloxicam (MOBIC) 7.5 MG tablet Take 1 tablet (7.5 mg total) by mouth daily. 11/26/16   Elpidio AnisUpstill, Shari, PA-C  metoprolol (LOPRESSOR) 50 MG tablet Take 50 mg by mouth 2 (two) times daily. 12/07/16   [provider]  mometasone-formoterol (DULERA) 100-5 MCG/ACT AERO Inhale 2 puffs into the lungs 2 (two) times daily. 06/14/17   Ginnie SmartHatcher, Jeffrey C, MD  Multiple Vitamin (MULTIVITAMIN WITH MINERALS) TABS tablet Take 1 tablet by mouth daily.    [provider]    Family History Family History  Problem Relation Age of Onset  . CAD Mother   . Stroke Mother     Social History Social History   Tobacco Use  . Smoking status: Current Every Day Smoker    Packs/day: 2.00    Years: 38.00    Pack years: 76.00    Types: Cigarettes  . Smokeless tobacco: Never Used  Substance Use Topics  . Alcohol use: Yes    Alcohol/week: 0.0 oz    Comment: 6 pack per week per patient   . Drug use: No     Allergies   Aspirin   Review of Systems Review of Systems  A complete review of systems was obtained and all systems are negative except as noted in the HPI and PMH.    Physical Exam Updated Vital Signs BP (!) 108/93 (BP Location: Right Arm)   Pulse 66   Temp 98.7 F (37.1 C) (Oral)   Resp 11   Ht 5\' 6"  (1.676 m)   Wt 84.8 kg (187 lb)   SpO2 95%   BMI 30.18 kg/m   Physical Exam  Constitutional: He is oriented to person, place, and time. He appears well-developed and  well-nourished. No distress.  HENT:  Head: Normocephalic.  Dry mucous membranes  Eyes: Conjunctivae are normal.  Neck: Normal range of motion. No JVD present. No tracheal deviation present.  Cardiovascular: Normal rate, regular rhythm and intact distal pulses.  Radial pulse equal bilaterally  Pulmonary/Chest: Effort normal and breath sounds normal. No stridor. No respiratory distress. He has no wheezes. He has no rales. He exhibits no tenderness.  Abdominal: Soft. He exhibits no distension and no mass. There is no tenderness. There is no rebound and no guarding.  Musculoskeletal: Normal range of motion. He exhibits no edema or tenderness.  No calf asymmetry, superficial collaterals, palpable cords, edema, Homans sign negative bilaterally.  Neurological: He is alert and oriented to person, place, and time.  Follows commands, no facial asymmetry, tongue extension midline, strength is 5 out of 5x4 extremities,  Sensation is grossly intact.   Skin: Skin is warm. He is not diaphoretic.  Psychiatric: He has a normal mood and affect.  Nursing note and vitals reviewed.    ED Treatments / Results  Labs (all labs ordered are listed, but only abnormal results are displayed) Labs Reviewed  CBC WITH DIFFERENTIAL/PLATELET - Abnormal; Notable for the following components:      Result Value   WBC 11.1 (*)    RBC 4.17 (*)    HCT 37.9 (*)    Neutro Abs 8.2 (*)    Monocytes Absolute 1.2 (*)    All other components within normal limits  BASIC METABOLIC PANEL - Abnormal; Notable for the following components:   Sodium 131 (*)    Chloride 97 (*)    CO2 19 (*)    Creatinine, Ser 1.75 (*)    GFR calc non Af Amer 43 (*)    GFR calc Af Amer 50 (*)    All other components within normal limits  ETHANOL - Abnormal; Notable for the following components:   Alcohol, Ethyl (B) 57 (*)    All other components within normal limits  URINALYSIS, ROUTINE W REFLEX MICROSCOPIC - Abnormal; Notable for the  following components:   Specific Gravity, Urine >1.030 (*)    Hgb urine dipstick LARGE (*)    Ketones, ur 15 (*)    Protein, ur 30 (*)    All other components within normal limits  RAPID URINE DRUG SCREEN, HOSP PERFORMED - Abnormal; Notable for the following components:   Opiates POSITIVE (*)    Cocaine POSITIVE (*)    Amphetamines POSITIVE (*)    Tetrahydrocannabinol POSITIVE (*)    All other components within normal limits  CK - Abnormal; Notable for the following components:   Total CK 3,342 (*)    All other components within normal limits  URINALYSIS, MICROSCOPIC (REFLEX) - Abnormal; Notable for the following components:   Bacteria, UA RARE (*)    All other components within normal limits  BRAIN NATRIURETIC PEPTIDE  MAGNESIUM  HEPATIC FUNCTION PANEL  I-STAT TROPONIN, ED    EKG  EKG Interpretation None       Radiology Dg Chest 2 View  Result Date: 11/23/2017 CLINICAL DATA:  Shortness of breath and chest tightness. EXAM: CHEST  2 VIEW COMPARISON:  Radiograph 07/26/2015 FINDINGS: Lower lung volumes from prior exam. The cardiomediastinal contours are normal. The lungs are clear. Pulmonary vasculature is normal. No consolidation, pleural effusion, or pneumothorax. No acute osseous abnormalities are seen. IMPRESSION: No acute pulmonary process. Electronically Signed   By: Rubye Oaks M.D.   On: 11/23/2017 06:42   Ct Head Wo Contrast  Result Date: 11/23/2017 CLINICAL DATA:  Paresthesia. EXAM: CT HEAD WITHOUT CONTRAST TECHNIQUE: Contiguous axial images were obtained from the base of the skull through the vertex without intravenous contrast. COMPARISON:  PET-CT dated 04/22/2014. FINDINGS: Brain: Mild generalized parenchymal atrophy with commensurate dilatation of the ventricles and sulci. Patchy opacities are seen within the periventricular and frontal lobe white matter, most likely chronic small vessel ischemic changes. No mass, hemorrhage, a edema or other evidence of acute  parenchymal abnormality. No extra-axial hemorrhage. Vascular: No hyperdense vessel or unexpected calcification. Skull: Normal. Negative for fracture or focal lesion. Sinuses/Orbits: No acute finding. Other: None. IMPRESSION: 1. No acute findings.  No intracranial mass,  hemorrhage or edema. 2. Chronic small vessel ischemic changes within the periventricular and bifrontal white matter. Electronically Signed   By: Bary Richard M.D.   On: 11/23/2017 07:45    Procedures Procedures (including critical care time)  Medications Ordered in ED Medications  sodium chloride 0.9 % bolus 1,000 mL (1,000 mLs Intravenous New Bag/Given 11/23/17 1023)  darunavir-cobicistat (PREZCOBIX) 800-150 MG per tablet 1 tablet (not administered)  dolutegravir (TIVICAY) tablet 50 mg (not administered)  emtricitabine-tenofovir AF (DESCOVY) 200-25 MG per tablet 1 tablet (not administered)  hydroxypropyl methylcellulose / hypromellose (ISOPTO TEARS / GONIOVISC) 2.5 % ophthalmic solution 1 drop (not administered)  mometasone-formoterol (DULERA) 100-5 MCG/ACT inhaler 2 puff (not administered)  enoxaparin (LOVENOX) injection 40 mg (not administered)  0.9 %  sodium chloride infusion (not administered)  thiamine (B-1) injection 100 mg (100 mg Intravenous Given 11/23/17 0643)  folic acid (FOLVITE) tablet 1 mg (1 mg Oral Given 11/23/17 1610)     Initial Impression / Assessment and Plan / ED Course  I have reviewed the triage vital signs and the nursing notes.  Pertinent labs & imaging results that were available during my care of the patient were reviewed by me and considered in my medical decision making (see chart for details).     Vitals:   11/23/17 0739 11/23/17 0740 11/23/17 0914 11/23/17 0915  BP: 114/83   (!) 108/93  Pulse: 71  65 66  Resp: 19  14 11   Temp:      TempSrc:      SpO2: 95%  95% 95%  Weight:  84.8 kg (187 lb)    Height:  5\' 6"  (1.676 m)      Medications  sodium chloride 0.9 % bolus 1,000 mL (1,000  mLs Intravenous New Bag/Given 11/23/17 1023)  darunavir-cobicistat (PREZCOBIX) 800-150 MG per tablet 1 tablet (not administered)  dolutegravir (TIVICAY) tablet 50 mg (not administered)  emtricitabine-tenofovir AF (DESCOVY) 200-25 MG per tablet 1 tablet (not administered)  hydroxypropyl methylcellulose / hypromellose (ISOPTO TEARS / GONIOVISC) 2.5 % ophthalmic solution 1 drop (not administered)  mometasone-formoterol (DULERA) 100-5 MCG/ACT inhaler 2 puff (not administered)  enoxaparin (LOVENOX) injection 40 mg (not administered)  0.9 %  sodium chloride infusion (not administered)  thiamine (B-1) injection 100 mg (100 mg Intravenous Given 11/23/17 0643)  folic acid (FOLVITE) tablet 1 mg (1 mg Oral Given 11/23/17 0738)    Jaiveon Suppes is 51 y.o. male presenting with shortness of breath, he has dry cough, facial paresthesias and chest pain.  Patient with HIV/AIDS, last CD4 count 220, states has been compliant with his medications, lung sounds clear to auscultation, vital signs reassuring  EKG nonischemic troponin negative, BNP negative, chest x-ray is without infiltrate, blood work with an acute kidney injury with a creatinine of 1.7.  Specific gravity is greater than 1.03 and he is got 15 ketones in his urine, significantly dehydrated clinically, will check a CK.  CK elevated over 3000, intoxicated with an alcohol level of 57 and multiple positive hits on UDS.  Will need admission for hydration and possible rhabdo, CT chest ordered to further evaluate for possible PCP pneumonia given his shortness of breath.  Unassigned admission to internal medicine teaching service, discussed with Dr. Dimple Casey who accepts admission.    Final Clinical Impressions(s) / ED Diagnoses   Final diagnoses:  AKI (acute kidney injury) (HCC)  Elevated CK  SOB (shortness of breath)    ED Discharge Orders    None       Trenden Hazelrigg, Joni Reining,  PA-C 11/23/17 1026    Benjiman CorePickering, Nathan, MD 11/24/17 1540

## 2017-11-23 NOTE — ED Notes (Signed)
Patient transported to X-ray 

## 2017-11-23 NOTE — H&P (Signed)
Date: 11/23/2017               Patient Name:  Steven [name] MRN: [id number]  DOB: 07/28/1966 Age / Sex: 51 y.o., male   PCP: Dartha LodgeSteele, Anthony, FNP         Medical Service: Internal Medicine Teaching Service         Attending Physician: Dr. Inez CatalinaMullen, Emily B, MD    First Contact: Dr. Crista ElliotHarbrecht Pager: [phone]  Second Contact: Dr. Mikey BussingHoffman Pager: [phone]       After Hours (After 5p/  First Contact Pager: [phone]  weekends / holidays): Second Contact Pager: [phone]   Chief Complaint: Chest pain, shortness of breath  History of Present Illness: Steven [name] is a 51 yo man with a pertinent history of HIV on treatment, hemorrhagic CVA with left sided deficits, treated hepatitis C, COPD, and substance use disorders including tobacco, alcohol, and opioids who presents to the ED today with chest pain and shortness of breath..This pain apparently started acutely and there was radiation to his left face with numbness. He was also coughing but could not recall if it was productive. He reports not missing any medications but he does smoke and has been using multiple drugs recently. He states he last injected drugs yesterday and last drank earlier today. He does not know exactly when he last smoked or took other illicit drugs. He denies abdominal pain nausea or vomiting. He did have a headache and has some degree of chronic diarrhea. He did not report urinary changes before presentation.  After arrival in the ED he underwent head CT given past CVA and mental changes that was unremarkable. CXR did not show acute airspace disease. Labs revealed CK elevation, myoglobinuria, and AKI. Urine toxicology showed multiple substances including amphetamines, opiates, cocaine, THC, and EtOH level of 57. Troponin was negative and no ischemic changes seen on EKG. He was given IV thiamine and IV saline started for dehydration and rhabdomyolysis with associated AKI.  He is alternating between agitated and somnolent  at the time of this exam so history is obtained by combination of patient and EDP reports  Meds:  Current Meds  Medication Sig  . darunavir-cobicistat (PREZCOBIX) 800-150 MG tablet Take 1 tablet by mouth daily with breakfast.  . dolutegravir (TIVICAY) 50 MG tablet Take 1 tablet (50 mg total) by mouth daily.  Marland Kitchen. gabapentin (NEURONTIN) 300 MG capsule Take 1 capsule (300 mg total) by mouth 3 (three) times daily.  . meloxicam (MOBIC) 7.5 MG tablet Take 1 tablet (7.5 mg total) by mouth daily.  . traZODone (DESYREL) 100 MG tablet Take 100 mg by mouth at bedtime.     Allergies: Allergies as of 11/23/2017 - Review Complete 11/23/2017  Allergen Reaction Noted  . Aspirin Other (See Comments) 07/06/2013   Past Medical History:  Diagnosis Date  . AIDS (HCC) 1985   . Asthma   . Cerebral hemorrhage (HCC) 2000  . Hemophilia (HCC)   . Hepatitis C    never treated  . HIV (human immunodeficiency virus infection) (HCC)     Family History: Family History  Problem Relation Age of Onset  . CAD Mother   . Stroke Mother    Social History:  Social History   Socioeconomic History  . Marital status: Single    Spouse name: None  . Number of children: None  . Years of education: None  . Highest education level: None  Social Needs  . Financial resource strain: None  . Food  insecurity - worry: None  . Food insecurity - inability: None  . Transportation needs - medical: None  . Transportation needs - non-medical: None  Occupational History  . None  Tobacco Use  . Smoking status: Current Every Day Smoker    Packs/day: 2.00    Years: 38.00    Pack years: 76.00    Types: Cigarettes  . Smokeless tobacco: Never Used  Substance and Sexual Activity  . Alcohol use: Yes    Alcohol/week: 0.0 oz    Comment: 6 pack per week per patient   . Drug use: No  . Sexual activity: No    Partners: Male  Other Topics Concern  . None  Social History Narrative  . None  Polysubstance use as described  above   Review of Systems: A complete ROS was negative except as per HPI.  Physical Exam: Blood pressure (!) 108/93, pulse 66, temperature 98.7 F (37.1 C), temperature source Oral, resp. rate 11, height 5\' 6"  (1.676 m), weight 187 lb (84.8 kg), SpO2 95 %. GENERAL- Somnolent but arouses easily and is agitated and fidgeting HEENT- Pupils are symmetric and reactive to light, oral mucosa is dry CARDIAC- RRR, no murmurs, rubs or gallops. RESP- CTAB, no wheezes or crackles. ABDOMEN- Soft, nontender, no guarding or rebound, hyperactive bowel sounds present NEURO- He is moving all extremities spontaneously with 5/5 strength, sensory assessment limited by inattentiveness at this time EXTREMITIES- pulse 2+, symmetric, no pedal edema. SKIN- Warm, diaphoretic, no rashes noted PSYCH- Speech is mostly intelligible but in short phrases, answers are appropriate   EKG: personally reviewed my interpretation is NSR with narrow QRS and no ST segment changes, similar to previous 06/2015 tracings.  CXR: personally reviewed my interpretation is no infiltrative airspace disease visible, film is shallower inspiration than comparison film from 06/2015.  Assessment & Plan by Problem: Polysubstance intoxication His mental status certainly appears altered and he is also very dehydrated. He is sleeping when not interacting but highly arousable. Toxicology is positive for numerous substances including amphetamine, cocaine, opiates, THC, and ethanol. The exact timing of injections, inhalations, and ingestions is unclear so I would recommend he be observed closely until mental status improves. -Cardiac monitoring -Admit to stepdown for monitoring -LFTs ordered -NOT ordering CIWA protocol at this time since I suspect multiple drugs besides ethanol contributing, reports last drink today, to be reassessed during the admission  Rhabdomyolysis Acute Kidney Injury CK of 3342, myoglobinuria on UA, and SCr is 1.75 up from  1.03 baseline in June. This is most likely drug induced, cocaine is certainly associated with rhabdomyolysis. He does not have high BUN/Cr ratio that would be more characteristic of primarily dehydration causing his AKI. -IV NS 1L bolus then 24400mL/hr continuously -Monitor I/Os ideally to achieve 1450mL/hr UOP or better -Repeat CK, Bmet in AM -Consider renal US, repeat Bmet earlier if UOP does not pick up  HIV He reports not missing medications recently. He had a CD4 count of 220 in June 2018 and was also on Bactrom prophylaxis. -If chest imaging is clear we will hold bactrim temporarily here during AKI. -Continue antiretrovirals prezcobix, tivicay, descovy here  FULL CODE VTE ppx: [place] enoxaparin Diet: NPO until mental status improves  Dispo: Admit patient to Inpatient with expected length of stay greater than 2 midnights.  Signed: Fuller Planhristopher W Rice, MD PGY-III Internal Medicine Resident Pager# [phone] 11/23/2017, 10:57 AM

## 2017-11-23 NOTE — ED Notes (Signed)
Patient transported to CT 

## 2017-11-23 NOTE — Discharge Summary (Signed)
Name: Steven [name] MRN: [id number] DOB: 05/18/1966 51 y.o. PCP: Dartha LodgeSteele, Anthony, FNP  Date of Admission: 11/23/2017  5:05 AM Date of Discharge:  Attending Physician: Inez CatalinaMullen, Emily B, MD  Discharge Diagnosis: Principal Problem:   Intoxication by drug [facility](HCC) Active Problems:   HIV disease (HCC)   Rhabdomyolysis   Acute kidney injury [facility](HCC)  Discharge Medications: Allergies as of 11/25/2017      Reactions   Aspirin Other (See Comments)   "Free bleeder" affects platelets      Medication List    STOP taking these medications   celecoxib 50 MG capsule Commonly known as:  CELEBREX   metoprolol tartrate 50 MG tablet Commonly known as:  LOPRESSOR     TAKE these medications   albuterol 108 (90 Base) MCG/ACT inhaler Commonly known as:  PROVENTIL HFA;VENTOLIN HFA Inhale 1-2 puffs into the lungs every 6 (six) hours as needed for wheezing or shortness of breath.   benzonatate 100 MG capsule Commonly known as:  TESSALON TAKE 1 CAPSULE(100 MG) BY MOUTH THREE TIMES DAILY AS NEEDED FOR COUGH   chlorhexidine 0.12 % solution Commonly known as:  PERIDEX Use as directed 10 mLs in the mouth or throat 4 (four) times daily. Swish and spit   cyclobenzaprine 10 MG tablet Commonly known as:  FLEXERIL Take 10 mg by mouth 3 (three) times daily as needed for muscle spasms.   darunavir-cobicistat 800-150 MG tablet Commonly known as:  PREZCOBIX Take 1 tablet by mouth daily with breakfast.   diclofenac sodium 1 % Gel Commonly known as:  VOLTAREN Apply 2 g topically 4 (four) times daily.   diphenhydrAMINE 25 mg capsule Commonly known as:  BENADRYL Take 25 mg by mouth every 6 (six) hours as needed for allergies.   dolutegravir 50 MG tablet Commonly known as:  TIVICAY Take 1 tablet (50 mg total) by mouth daily.   dronabinol 2.5 MG capsule Commonly known as:  MARINOL TAKE ONE CAPSULE BY MOUTH TWICE DAILY BEFORE A MEAL   DULoxetine 60 MG capsule Commonly known as:  CYMBALTA TAKE  1 CAPSULE BY MOUTH DAILY   emtricitabine-tenofovir AF 200-25 MG tablet Commonly known as:  DESCOVY Take 1 tablet by mouth daily.   ENSURE Take 237 mLs by mouth 2 (two) times daily between meals. Do not take with medications.   gabapentin 300 MG capsule Commonly known as:  NEURONTIN Take 1 capsule (300 mg total) by mouth 3 (three) times daily.   hydroxypropyl methylcellulose / hypromellose 2.5 % ophthalmic solution Commonly known as:  ISOPTO TEARS / GONIOVISC Place 1 drop into both eyes 3 (three) times daily as needed for dry eyes.   lisinopril 20 MG tablet Commonly known as:  PRINIVIL,ZESTRIL Take 1 tablet (20 mg total) by mouth daily.   meloxicam 7.5 MG tablet Commonly known as:  MOBIC Take 1 tablet (7.5 mg total) by mouth daily.   mometasone-formoterol 100-5 MCG/ACT Aero Commonly known as:  DULERA Inhale 2 puffs into the lungs 2 (two) times daily.   multivitamin with minerals Tabs tablet Take 1 tablet by mouth daily.   sulfamethoxazole-trimethoprim 800-160 MG tablet Commonly known as:  BACTRIM DS,SEPTRA DS Take 1 tablet by mouth daily.   terazosin 1 MG capsule Commonly known as:  HYTRIN Take 1 mg by mouth at bedtime.   traZODone 100 MG tablet Commonly known as:  DESYREL Take 100 mg by mouth at bedtime.      Disposition and follow-up:   [name].  At the hospital follow up visit please address:  1.  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 The patient's urine drug screen was positive for cocaine, methamphetamine, opioids and THC. He would likely benefit from outpatient rehab.  2.  Labs / imaging needed at time of follow-up: BMP  3.  Pending labs/ test needing follow-up: n/a  Follow-up Appointments: Follow-up Information    Dartha Lodge, FNP Follow up.   Specialty:  Nurse Practitioner Contact information: [address] [place]  [address] [phone]          Hospital Course by problem list: Principal Problem:   Intoxication by drug [facility]) Active Problems:   HIV disease (HCC)   Rhabdomyolysis   Acute kidney injury (HCC)   1. Multidrug intoxication:  Steven [name] is a 51 year old male who presented to the ED with severe anxiety and chest pain with notable AMS and dehydration. His mental status was altered but baseline is unclear as he is both homeless and has previously documented CVA. The patients mental status was somewhat altered but given his toxicology report indicating cocaine, amphetamines, opiates and TCH with positive ethanol. The timing of these substances was unclear at admission but he improved over the following 24 hours, has tolerated p.o. intake, and admitted to ingestion of multiple toxic substances including oxycodone, cocaine, amphetamines, THC, NS at all.  He was monitored with telemetry with repeat labs scheduled for the following day.  Patient rapidly recovered from initial event and was stable with regard to his polysubstance intoxication by day 2 of admission.  2. Rhabdomyolysis and acute renal injury: Acute kidney injury with initial serum creatinine 1.75 which improved to 0.98 over the course of his admission.  Initial CK was 3342 which increased to 7800 by 6 PM was elevated to 5769 the following morning.  The patient was informed he would need to undergo continuous IV fluid administration with regular bolusing of normal saline to reach urinary output of 200-300 mL's per hour in order to effectively reverse the renal injury and recover from the rhabdo.  3. HIV: Follows with outpatient infectious disease clinic with Dr. Ninetta Lights. CD4 in June 2018 was 220 in conjunction he was on Bactrim prophylaxis. We continued his antiretroviral's prezcobix, tivicay, descovy during his admission and discharged him home   4. Hypertension. The patient was initially on Metoprolol for his HTN. With his excessive  cocaine use this was transitioned to Lisinopril and the patient was told to stop his metoprolol. He will follow-up with his PCP.    Discharge Vitals:   BP (!) 145/93 (BP Location: Right Arm)   Pulse 76   Temp 97.7 F (36.5 C) (Oral)   Resp 16   Ht 5\' 6"  (1.676 m)   Wt 187 lb (84.8 kg)   SpO2 96%   BMI 30.18 kg/m   Pertinent Labs, Studies, and Procedures:  CBC Latest Ref Rng & Units 11/24/2017 11/23/2017 05/08/2017  WBC 4.0 - 10.5 K/uL 4.9 11.1(H) 7.2  Hemoglobin 13.0 - 17.0 g/dL 12.5(L) 13.6 14.4  Hematocrit 39.0 - 52.0 % 36.1(L) 37.9(L) 43.7  Platelets 150 - 400 K/uL 145(L) 167 205   CMP Latest Ref Rng & Units 11/24/2017 11/23/2017 05/08/2017  Glucose 65 - 99 mg/dL 914(N) 78 96  BUN 6 - 20 mg/dL 14 19 15   Creatinine 0.61 - 1.24 mg/dL 8.29 5.62(Z) 3.08  Sodium 135 - 145 mmol/L 133(L) 131(L) 133(L)  Potassium 3.5 - 5.1 mmol/L 3.4(L) 3.6 4.1  Chloride 101 - 111 mmol/L 103 97(L) 100  CO2 22 - 32 mmol/L 23 19(L) 20  Calcium 8.9 - 10.3 mg/dL 7.9(L) 9.0 9.3  Total Protein 6.5 - 8.1 g/dL - 7.7 7.7  Total Bilirubin 0.3 - 1.2 mg/dL - 1.0 0.6  Alkaline Phos 38 - 126 U/L - 51 53  AST 15 - 41 U/L - 121(H) 19  ALT 17 - 63 U/L - 41 13   Discharge Instructions: Discharge Instructions    Diet - low sodium heart healthy   Complete by:  As directed    Increase activity slowly   Complete by:  As directed      Signed: Levora DredgeHelberg, Ocia Simek, MD 11/25/2017, 11:05 AM   [phone]

## 2017-11-23 NOTE — ED Notes (Addendum)
Pt resting but states that he just woke up due to IV alarm. Pt states that he called EMS himself after having numbness in face, chest , left and right arm. Pt denies pain at this time or any numbness; pt did states that he "drinks a lot." CIWA completed; denies n/v/d; states that he has ahces sometimes but that's normal for him because he "has full blown AIDS."

## 2017-11-23 NOTE — ED Notes (Signed)
Rec'd call from 5W, report given to floor nurse.

## 2017-11-23 NOTE — ED Notes (Signed)
Bag of belongs including patients meds sent upstairs to floor with patient

## 2017-11-23 NOTE — ED Triage Notes (Addendum)
Pt here via [facility] with c/o "my body is out of synchronicity". Pt has multiple complaints. States he has chest tightness and abd pain but not able to tell me how long. VSS. EMS states that pt was very dramatic in the ambulance, talking about religion and the lord. + ETOH. Will not answer if he has done any drugs. States "you may find Suboxone in my system". Alert and orient x 4. [facility] a few days ago.

## 2017-11-24 ENCOUNTER — Encounter (HOSPITAL_COMMUNITY): Payer: Self-pay | Admitting: General Practice

## 2017-11-24 DIAGNOSIS — M25539 Pain in unspecified wrist: Secondary | ICD-10-CM

## 2017-11-24 DIAGNOSIS — F19129 Other psychoactive substance abuse with intoxication, unspecified: Secondary | ICD-10-CM

## 2017-11-24 DIAGNOSIS — M549 Dorsalgia, unspecified: Secondary | ICD-10-CM

## 2017-11-24 DIAGNOSIS — G8929 Other chronic pain: Secondary | ICD-10-CM

## 2017-11-24 DIAGNOSIS — M25559 Pain in unspecified hip: Secondary | ICD-10-CM

## 2017-11-24 LAB — BASIC METABOLIC PANEL
ANION GAP: 7 (ref 5–15)
BUN: 14 mg/dL (ref 6–20)
CALCIUM: 7.9 mg/dL — AB (ref 8.9–10.3)
CO2: 23 mmol/L (ref 22–32)
Chloride: 103 mmol/L (ref 101–111)
Creatinine, Ser: 0.98 mg/dL (ref 0.61–1.24)
Glucose, Bld: 131 mg/dL — ABNORMAL HIGH (ref 65–99)
POTASSIUM: 3.4 mmol/L — AB (ref 3.5–5.1)
SODIUM: 133 mmol/L — AB (ref 135–145)

## 2017-11-24 LAB — CBC
HEMATOCRIT: 36.1 % — AB (ref 39.0–52.0)
HEMOGLOBIN: 12.5 g/dL — AB (ref 13.0–17.0)
MCH: 32.3 pg (ref 26.0–34.0)
MCHC: 34.6 g/dL (ref 30.0–36.0)
MCV: 93.3 fL (ref 78.0–100.0)
Platelets: 145 10*3/uL — ABNORMAL LOW (ref 150–400)
RBC: 3.87 MIL/uL — AB (ref 4.22–5.81)
RDW: 12.8 % (ref 11.5–15.5)
WBC: 4.9 10*3/uL (ref 4.0–10.5)

## 2017-11-24 LAB — CK
CK TOTAL: 5769 U/L — AB (ref 49–397)
CK TOTAL: 4039 U/L — AB (ref 49–397)

## 2017-11-24 MED ORDER — SODIUM CHLORIDE 0.9 % IV SOLN
INTRAVENOUS | Status: DC
  Administered 2017-11-24 – 2017-11-25 (×4): via INTRAVENOUS

## 2017-11-24 MED ORDER — TERAZOSIN HCL 1 MG PO CAPS
1.0000 mg | ORAL_CAPSULE | Freq: Every day | ORAL | Status: DC
  Administered 2017-11-24: 1 mg via ORAL
  Filled 2017-11-24: qty 1

## 2017-11-24 MED ORDER — TRAMADOL HCL 50 MG PO TABS: 50.0000 mg | ORAL_TABLET | Freq: Two times a day (BID) | ORAL | Status: DC | PRN

## 2017-11-24 MED ORDER — INFLUENZA VAC SPLIT QUAD 0.5 ML IM SUSY
0.5000 mL | PREFILLED_SYRINGE | INTRAMUSCULAR | Status: AC
  Administered 2017-11-25: 0.5 mL via INTRAMUSCULAR
  Filled 2017-11-24: qty 0.5

## 2017-11-24 MED ORDER — POTASSIUM CHLORIDE CRYS ER 20 MEQ PO TBCR
40.0000 meq | EXTENDED_RELEASE_TABLET | Freq: Two times a day (BID) | ORAL | Status: AC
  Administered 2017-11-24: 40 meq via ORAL
  Filled 2017-11-24: qty 2

## 2017-11-24 MED ORDER — NICOTINE 21 MG/24HR TD PT24
21.0000 mg | MEDICATED_PATCH | Freq: Every day | TRANSDERMAL | Status: DC
  Administered 2017-11-24 – 2017-11-25 (×2): 21 mg via TRANSDERMAL
  Filled 2017-11-24 (×2): qty 1

## 2017-11-24 MED ORDER — BUPRENORPHINE HCL-NALOXONE HCL 8-2 MG SL SUBL
1.0000 | SUBLINGUAL_TABLET | Freq: Two times a day (BID) | SUBLINGUAL | Status: DC
  Administered 2017-11-24 – 2017-11-25 (×3): 1 via SUBLINGUAL
  Filled 2017-11-24 (×3): qty 1

## 2017-11-24 MED ORDER — SULFAMETHOXAZOLE-TRIMETHOPRIM 800-160 MG PO TABS
1.0000 | ORAL_TABLET | Freq: Every day | ORAL | Status: DC
  Administered 2017-11-24 – 2017-11-25 (×2): 1 via ORAL
  Filled 2017-11-24 (×2): qty 1

## 2017-11-24 MED ORDER — PNEUMOCOCCAL VAC POLYVALENT 25 MCG/0.5ML IJ INJ
0.5000 mL | INJECTION | INTRAMUSCULAR | Status: DC
  Filled 2017-11-24: qty 0.5

## 2017-11-24 MED ORDER — HYDRALAZINE HCL 20 MG/ML IJ SOLN
5.0000 mg | Freq: Once | INTRAMUSCULAR | Status: AC
  Administered 2017-11-24: 5 mg via INTRAVENOUS
  Filled 2017-11-24: qty 1

## 2017-11-24 NOTE — Progress Notes (Signed)
Nutrition Brief Note  Patient identified on the Malnutrition Screening Tool (MST) Report. Weight has been stable for the past 4-5 months. No significant weight loss noted.   Nutrition focused physical exam completed.  No muscle or subcutaneous fat depletion noticed.   Wt Readings from Last 15 Encounters:  11/23/17 187 lb (84.8 kg)  07/20/17 184 lb (83.5 kg)  06/20/17 175 lb (79.4 kg)  04/21/17 194 lb (88 kg)  03/25/17 194 lb (88 kg)  01/08/17 199 lb (90.3 kg)  12/15/16 199 lb 6.4 oz (90.4 kg)  11/15/16 203 lb (92.1 kg)  09/13/16 193 lb (87.5 kg)  01/12/16 162 lb (73.5 kg)  10/13/15 172 lb (78 kg)  08/02/15 176 lb (79.8 kg)  07/27/15 160 lb 0 oz (72.6 kg)  07/15/15 165 lb (74.8 kg)  07/12/15 165 lb (74.8 kg)    Body mass index is 30.18 kg/m. Patient meets criteria for class 1 obesity based on current BMI.   Current diet order is regular, patient is consuming approximately 100% of meals at this time. Labs and medications reviewed.   No nutrition interventions warranted at this time. If nutrition issues arise, please consult RD.   Joaquin CourtsKimberly Zyonna Vardaman, RD, LDN, CNSC Pager [phone] After Hours Pager [phone]

## 2017-11-24 NOTE — Progress Notes (Signed)
   Subjective: The patient was resting today.  He was adamant about leaving team he has issues with chronic pain have not been relieved by traditional medicine she is unable to afford pain clinic consult.  Patient states that he used oxycodone prior to the most recent admission as well as cocaine, amphetamines, and marijuana in the past 3-4 days on attempt to reportedly relieve his pain.  The patient denied Exact cause of his fatigue and or confusion but stated that he would attempt to avoid recurrence in the future.  Patient requested pain control for his chronic hip wrist and back pain as well as assistance with long-term pain control.  Objective:  Vital signs in last 24 hours: Vitals:   11/23/17 1730 11/23/17 1849 11/23/17 2133 11/24/17 0447  BP: 117/81 115/86 132/90 (!) 145/83  Pulse: (!) 49 64 67 65  Resp: 18 16 18 18   Temp:  97.9 F (36.6 C) 97.8 F (36.6 C) (!) 97.3 F (36.3 C)  TempSrc:   Oral Oral  SpO2: 95%  96% 96%  Weight:      Height:       ROS negative except as per HPI.  Physical Exam  Constitutional: He appears well-developed and well-nourished.  Non-toxic appearance. He does not appear ill.  Eyes: EOM are normal. Pupils are equal, round, and reactive to light.  Cardiovascular: Normal rate, regular rhythm and normal pulses.  Pulmonary/Chest: Effort normal and breath sounds normal. No tachypnea.  Abdominal: Soft. Bowel sounds are normal. There is no tenderness.    Assessment/Plan:  Principal Problem:   Intoxication by drug (HCC) Active Problems:   HIV disease (HCC)   Rhabdomyolysis   Acute kidney injury (HCC)  Polysubstance intoxication: Lavonia DanaBryant Scheib is a [age] year old male who presented to the emergency department with altered mental status and severe dehydration.  He awoke over the following 24 hours as tolerated p.o. intake, and admitted to ingestion of multiple toxic substances including oxycodone, cocaine, amphetamines, THC, NS at all.  He was monitored  with telemetry with repeat labs scheduled for the following day.  Patient rapidly recovered from initial event and was stable with regard to his polysubstance intoxication by day 2 of admission.  Rhabdomyolysis: Acute kidney injury with initial serum creatinine 1.75 which improved to 0.98 over the course of his admission.  Initial CK was 3342 which increased to 7800 by 6 PM was elevated to 5769 the following morning.  The patient was informed he would need to undergo continuous IV fluid administration with regular bolusing of normal saline to reach urinary output of 200-300 mL's per hour in order to effectively reverse the renal injury and recover from the rhabdo. --Maintain urinary output at 200-300 mL's per hour continuously -Monitor electrolytes daily and replace as indicated - Strict I's and his urinary output -Bolus 1 L of IV normal saline if CK returned significantly elevated degenerative rule -Repeat CK 3 times daily to peak is obtained and is clear the patient CK is on a downward trend - We will continue to monitor urinary output and check CK 3 times daily until improvement occurs. Target CK 4-5K prior to discharge   Diet: Regular Code: Full Fluids: 300 mL an hour normal saline DVT P PX: Lovenox Dispo: Anticipated discharge in approximately 0-1 day(s).   Lanelle BalHarbrecht, Hazelle Woollard, MD 11/24/2017, 6:38 AM Pager: Pager# [phone]

## 2017-11-25 DIAGNOSIS — Z8673 Personal history of transient ischemic attack (TIA), and cerebral infarction without residual deficits: Secondary | ICD-10-CM

## 2017-11-25 DIAGNOSIS — F11929 Opioid use, unspecified with intoxication, unspecified: Secondary | ICD-10-CM

## 2017-11-25 DIAGNOSIS — Z590 Homelessness: Secondary | ICD-10-CM

## 2017-11-25 DIAGNOSIS — F12929 Cannabis use, unspecified with intoxication, unspecified: Secondary | ICD-10-CM

## 2017-11-25 DIAGNOSIS — F15929 Other stimulant use, unspecified with intoxication, unspecified: Secondary | ICD-10-CM

## 2017-11-25 DIAGNOSIS — Z886 Allergy status to analgesic agent status: Secondary | ICD-10-CM

## 2017-11-25 DIAGNOSIS — N179 Acute kidney failure, unspecified: Secondary | ICD-10-CM

## 2017-11-25 DIAGNOSIS — B20 Human immunodeficiency virus [HIV] disease: Secondary | ICD-10-CM

## 2017-11-25 DIAGNOSIS — M6282 Rhabdomyolysis: Secondary | ICD-10-CM

## 2017-11-25 DIAGNOSIS — F14929 Cocaine use, unspecified with intoxication, unspecified: Secondary | ICD-10-CM

## 2017-11-25 DIAGNOSIS — I10 Essential (primary) hypertension: Secondary | ICD-10-CM

## 2017-11-25 LAB — CK: Total CK: 3451 U/L — ABNORMAL HIGH (ref 49–397)

## 2017-11-25 MED ORDER — LISINOPRIL 20 MG PO TABS
20.0000 mg | ORAL_TABLET | Freq: Once | ORAL | Status: AC
  Administered 2017-11-25: 20 mg via ORAL
  Filled 2017-11-25: qty 1

## 2017-11-25 MED ORDER — LISINOPRIL 20 MG PO TABS: 20.0000 mg | ORAL_TABLET | Freq: Every day | ORAL | 0 refills | Status: DC

## 2017-11-25 NOTE — Progress Notes (Signed)
   Subjective: Patient is doing well this AM. He is ready to leave today. He discussed that he has not been getting his Metoprolol for BP control as he takes at home. We discussed that we are evaluating what medications he should be on given his extensive drug use. He states that he uses drugs to management his chronic pain. He has attempted to get into pain clinics but has been unsuccessful. We discussed that he is stable for discharge. He will need to follow-up with his PCP. Strongly advised against the use of illicit substances. He voices understanding and agrees with the plan. All questions and concerns addressed.   Objective: Vital signs in last 24 hours: Vitals:   11/24/17 2244 11/25/17 0028 11/25/17 0542 11/25/17 0946  BP: (!) 188/111 (!) 161/96 (!) 145/93   Pulse: 65  71 76  Resp:   20 16  Temp:   97.7 F (36.5 C)   TempSrc:   Oral   SpO2:   99% 96%  Weight:      Height:       General: Well nourished male in no acute distress Pulm: Good air movement with no wheezing or crackles  CV: RRR, no murmurs, no rubs Abdomen: Active bowel sounds, soft, non-distended, no tenderness to palpation  Extremities: No LE edema  Psych: Patient has pressured speech today, flagrant arm movement, and intense stare   Assessment/Plan:  Polysubstance Abuse / Intoxication  - Patient initially presented to the ED with AMS and urine toxicology positive for amphetamines, opiates, cocaine, THC, and EtOH - His AMS has resolved and he is back to baseline, we discussed cessation of illicit substance use. He states that he self medicates for chronic pain and has been trying to get into a pain clinic.  - Asking for pain medications prior to discharge.  - Also discussed that his substance abuse places him at risk for both medical and medication induces complications.  - He is interested in stopping his opiate use, he feels unsafe obtaining the opiates, and he has used suboxone in the past. He is interested in  restarting suboxone  Rhabdomyolysis  - Patient presented with AKI and elevated CK. His creatinine has since returned to baseline and his CK is trending down.  - He is stable for discharge and will follow up with his PCP  Hypertension  - Patient states that he was put on Metoprolol for his HTN and it previously controlled it well. He states that he is complaint with his medications. We discussed the risk of using a beta-blocker with cocaine. He is interested in trying another medications  - We will start him on Lisinopril and stop his Metoprolol. He will follow-up with his PCP  Dispo: Anticipated discharge today.   Levora DredgeHelberg, Juddson Cobern, MD 11/25/2017, 10:03 AM My Pager: [phone]

## 2017-11-25 NOTE — Progress Notes (Signed)
Patient blood pressure elevated, physician made aware.  New orders received and medication given.  Blood pressure rechecked and documented in chart.  Will continue to monitor.  Macarthur CritchleyMarie Irving Lubbers, RN

## 2017-11-25 NOTE — Progress Notes (Signed)
Bus pass given and patient discharged

## 2017-11-28 ENCOUNTER — Other Ambulatory Visit: Payer: Self-pay

## 2017-12-10 ENCOUNTER — Ambulatory Visit: Payer: Self-pay | Admitting: Infectious Diseases

## 2017-12-15 ENCOUNTER — Other Ambulatory Visit: Payer: Self-pay | Admitting: Infectious Diseases

## 2017-12-15 DIAGNOSIS — G8929 Other chronic pain: Secondary | ICD-10-CM

## 2017-12-15 DIAGNOSIS — M25552 Pain in left hip: Principal | ICD-10-CM

## 2017-12-15 DIAGNOSIS — Z21 Asymptomatic human immunodeficiency virus [HIV] infection status: Secondary | ICD-10-CM

## 2017-12-15 DIAGNOSIS — M25562 Pain in left knee: Principal | ICD-10-CM

## 2017-12-15 DIAGNOSIS — M25561 Pain in right knee: Principal | ICD-10-CM

## 2017-12-15 DIAGNOSIS — M25551 Pain in right hip: Principal | ICD-10-CM

## 2017-12-22 ENCOUNTER — Other Ambulatory Visit: Payer: Self-pay | Admitting: Internal Medicine

## 2018-01-02 ENCOUNTER — Telehealth: Payer: Self-pay | Admitting: *Deleted

## 2018-01-02 NOTE — Telephone Encounter (Signed)
Patient left a message asking for refill of Bactrim DS. He has been hospitalized with acute kidney injury, has CD4>200, has labs/follow up scheduled 2/6 and 2/20. Please advise. Andree CossHowell, Avir Deruiter M, RN

## 2018-01-03 NOTE — Telephone Encounter (Signed)
Please stop bactrim.

## 2018-01-09 ENCOUNTER — Other Ambulatory Visit: Payer: Medicaid Other

## 2018-01-09 ENCOUNTER — Encounter: Payer: Self-pay | Admitting: Infectious Diseases

## 2018-01-09 DIAGNOSIS — B20 Human immunodeficiency virus [HIV] disease: Secondary | ICD-10-CM

## 2018-01-09 DIAGNOSIS — Z113 Encounter for screening for infections with a predominantly sexual mode of transmission: Secondary | ICD-10-CM

## 2018-01-09 DIAGNOSIS — Z79899 Other long term (current) drug therapy: Secondary | ICD-10-CM

## 2018-01-09 NOTE — Addendum Note (Signed)
Addended byJimmy Picket: ABBITT, KATRINA F on: 01/09/2018 08:20 AM   Modules accepted: Orders

## 2018-01-10 LAB — CBC
HCT: 44.4 % (ref 38.5–50.0)
Hemoglobin: 15.3 g/dL (ref 13.2–17.1)
MCH: 31.9 pg (ref 27.0–33.0)
MCHC: 34.5 g/dL (ref 32.0–36.0)
MCV: 92.7 fL (ref 80.0–100.0)
MPV: 10.1 fL (ref 7.5–12.5)
Platelets: 260 10*3/uL (ref 140–400)
RBC: 4.79 10*6/uL (ref 4.20–5.80)
RDW: 11.8 % (ref 11.0–15.0)
WBC: 6.1 10*3/uL (ref 3.8–10.8)

## 2018-01-10 LAB — LIPID PANEL
CHOLESTEROL: 158 mg/dL (ref <200)
HDL: 63 mg/dL (ref >40)
LDL Cholesterol (Calc): 80 mg/dL (calc)
Non-HDL Cholesterol (Calc): 95 mg/dL (calc) (ref <130)
Total CHOL/HDL Ratio: 2.5 (calc) (ref <5.0)
Triglycerides: 69 mg/dL (ref <150)

## 2018-01-10 LAB — T-HELPER CELL (CD4) - (RCID CLINIC ONLY)
CD4 % Helper T Cell: 13 % — ABNORMAL LOW (ref 33–55)
CD4 T CELL ABS: 190 /uL — AB (ref 400–2700)

## 2018-01-10 LAB — COMPREHENSIVE METABOLIC PANEL
AG RATIO: 1.4 (calc) (ref 1.0–2.5)
ALBUMIN MSPROF: 4.9 g/dL (ref 3.6–5.1)
ALT: 18 U/L (ref 9–46)
AST: 17 U/L (ref 10–35)
Alkaline phosphatase (APISO): 56 U/L (ref 40–115)
BILIRUBIN TOTAL: 0.5 mg/dL (ref 0.2–1.2)
BUN: 11 mg/dL (ref 7–25)
CALCIUM: 10.4 mg/dL — AB (ref 8.6–10.3)
CO2: 25 mmol/L (ref 20–32)
Chloride: 101 mmol/L (ref 98–110)
Creat: 0.94 mg/dL (ref 0.70–1.33)
Globulin: 3.6 g/dL (calc) (ref 1.9–3.7)
Glucose, Bld: 106 mg/dL — ABNORMAL HIGH (ref 65–99)
POTASSIUM: 4.3 mmol/L (ref 3.5–5.3)
SODIUM: 137 mmol/L (ref 135–146)
TOTAL PROTEIN: 8.5 g/dL — AB (ref 6.1–8.1)

## 2018-01-10 LAB — RPR TITER

## 2018-01-10 LAB — FLUORESCENT TREPONEMAL AB(FTA)-IGG-BLD: Fluorescent Treponemal ABS: REACTIVE — AB

## 2018-01-10 LAB — RPR: RPR: REACTIVE — AB

## 2018-01-11 LAB — HIV-1 RNA QUANT-NO REFLEX-BLD
HIV 1 RNA QUANT: NOT DETECTED {copies}/mL
HIV-1 RNA QUANT, LOG: NOT DETECTED {Log_copies}/mL

## 2018-01-23 ENCOUNTER — Encounter: Payer: Self-pay | Admitting: Infectious Diseases

## 2018-01-23 ENCOUNTER — Ambulatory Visit (INDEPENDENT_AMBULATORY_CARE_PROVIDER_SITE_OTHER): Payer: Medicaid Other | Admitting: Infectious Diseases

## 2018-01-23 ENCOUNTER — Telehealth: Payer: Self-pay

## 2018-01-23 VITALS — BP 124/76 | HR 73 | Temp 98.3°F | Wt 187.0 lb

## 2018-01-23 DIAGNOSIS — Z5900 Homelessness unspecified: Secondary | ICD-10-CM

## 2018-01-23 DIAGNOSIS — Z21 Asymptomatic human immunodeficiency virus [HIV] infection status: Secondary | ICD-10-CM | POA: Diagnosis not present

## 2018-01-23 DIAGNOSIS — B182 Chronic viral hepatitis C: Secondary | ICD-10-CM | POA: Diagnosis not present

## 2018-01-23 DIAGNOSIS — F1199 Opioid use, unspecified with unspecified opioid-induced disorder: Secondary | ICD-10-CM | POA: Diagnosis not present

## 2018-01-23 DIAGNOSIS — M6282 Rhabdomyolysis: Secondary | ICD-10-CM | POA: Diagnosis not present

## 2018-01-23 DIAGNOSIS — B20 Human immunodeficiency virus [HIV] disease: Secondary | ICD-10-CM | POA: Diagnosis not present

## 2018-01-23 DIAGNOSIS — F1190 Opioid use, unspecified, uncomplicated: Secondary | ICD-10-CM | POA: Insufficient documentation

## 2018-01-23 MED ORDER — DRONABINOL 2.5 MG PO CAPS: ORAL_CAPSULE | ORAL | 4 refills | Status: DC

## 2018-01-23 NOTE — Progress Notes (Signed)
   Subjective:    Patient ID: Steven [name], male    DOB: 01/31/1966, 52 y.o.   MRN: [id number]  HPI 51yo M with hx of HIV+ since 1985 (previously on Darunavir-cobi/descovey/tivicay), [facility]. CVA 2010.  He had anal pap 06-2011 which showed ASCUS. Has been seen at [facility]  Most recently on tivicay-descovy.  Was in hospital 11-2017 after using drugs. Had "muscle breakdown".  Review of his record shows that his tox screen was positive for cocaine/oxy/amphetamines/thc. He was found to have AKI with Cr up to 1.75. His Ck was up to 7,800.  Is scheduled to start suboxone after [facility]   Has been using suboxone lately, "I can't say" where I got it.   HIV 1 RNA Quant (copies/mL)  Date Value  01/09/2018 <20 NOT DETECTED  05/08/2017 46 (H)  01/08/2017 <20 DETECTED (A)   CD4 T Cell Abs (/uL)  Date Value  01/09/2018 190 (L)  05/08/2017 220 (L)  01/08/2017 220 (L)   1 ppd tobacco Living in a hotel, through his program.  [facility] been [facility] to [facility], [facility] Rid[facility] is his CM.   Review of Systems  Constitutional: Negative for appetite change and unexpected weight change.  Respiratory: Positive for cough. Negative for shortness of breath.   Gastrointestinal: Negative for constipation and diarrhea.  Genitourinary: Negative for difficulty urinating.  Psychiatric/Behavioral: Negative for sleep disturbance.       Objective:   Physical Exam  Constitutional: He appears well-developed and well-nourished.  HENT:  Mouth/Throat: No oropharyngeal exudate.  Eyes: EOM are normal. Pupils are equal, round, and reactive to light.  Neck: Neck supple.  Cardiovascular: Normal rate, regular rhythm and normal heart sounds.  Pulmonary/Chest: Effort normal and breath sounds normal.  Abdominal: Soft. Bowel sounds are normal. There is no tenderness. There is no rebound.  Musculoskeletal: He exhibits no edema.  Lymphadenopathy:    He has no  cervical adenopathy.  Psychiatric: He has a normal mood and affect.       Assessment & Plan:

## 2018-01-23 NOTE — Assessment & Plan Note (Signed)
Currently housing stable.  Will have him f/u with [facility].

## 2018-01-23 NOTE — Assessment & Plan Note (Signed)
Will get him into IM clinic and transition him to f/u with me in future after certification complete.

## 2018-01-23 NOTE — Assessment & Plan Note (Signed)
He has resolved this.  Will mark as so.

## 2018-01-23 NOTE — Assessment & Plan Note (Signed)
Cured Needs to get a liver u/s

## 2018-01-23 NOTE — Telephone Encounter (Signed)
Per Dr. Ninetta LightsHatcher it's okay to refill the pt's Dronabinol 2.5 mg capsule. I [facility] today just waiting on a response.  Lorenso CourierJose L Maldonado, [place]

## 2018-01-23 NOTE — Assessment & Plan Note (Addendum)
He is doing well with his meds.  His vax are up to date He meets with [facility] today He is given food from pantry.  marinol refilled Will see him back in 1 month

## 2018-01-24 NOTE — Progress Notes (Signed)
Tried to call, lm for rtc 

## 2018-02-05 ENCOUNTER — Other Ambulatory Visit: Payer: Self-pay

## 2018-02-05 NOTE — Telephone Encounter (Signed)
According to fax received from insurance they will not approve the medication as the patient BMI is 30.18 as of visit 01/23/18. Tried to call the patient number not in service.

## 2018-02-15 ENCOUNTER — Ambulatory Visit (HOSPITAL_COMMUNITY)
Admission: EM | Admit: 2018-02-15 | Discharge: 2018-02-15 | Disposition: A | Discharge status: Home / Self Care | Payer: Medicaid Other | Attending: Family Medicine | Admitting: Family Medicine

## 2018-02-15 ENCOUNTER — Encounter (HOSPITAL_COMMUNITY): Payer: Self-pay | Admitting: Emergency Medicine

## 2018-02-15 DIAGNOSIS — J069 Acute upper respiratory infection, unspecified: Secondary | ICD-10-CM | POA: Diagnosis not present

## 2018-02-15 DIAGNOSIS — B9789 Other viral agents as the cause of diseases classified elsewhere: Secondary | ICD-10-CM

## 2018-02-15 MED ORDER — BENZONATATE 100 MG PO CAPS: ORAL_CAPSULE | ORAL | 0 refills | Status: DC

## 2018-02-15 MED ORDER — CHLORHEXIDINE GLUCONATE 0.12 % MT SOLN: 10.0000 mL | Freq: Four times a day (QID) | OROMUCOSAL | 1 refills | Status: DC

## 2018-02-15 MED ORDER — HYDROCODONE-HOMATROPINE 5-1.5 MG/5ML PO SYRP: 5.0000 mL | ORAL_SOLUTION | Freq: Four times a day (QID) | ORAL | 0 refills | Status: DC | PRN

## 2018-02-15 NOTE — ED Triage Notes (Signed)
Pt c/o coughing x2 weeks, states he can hardly sleep. Was given promethazine-codeine last time he had a similar cough and it helped.

## 2018-02-15 NOTE — ED Provider Notes (Signed)
[facility]   [id number] 02/15/18 Arrival Time: 1056   SUBJECTIVE:  Steven [name] is a 52 y.o. male who presents to the urgent care with complaint of coughing x2 weeks, states he can hardly sleep. Was given promethazine-codeine last time he had a similar cough and it helped.   Past Medical History:  Diagnosis Date  . AIDS (HCC) 1985   . Asthma   . Cerebral hemorrhage (HCC) 2000  . Hemophilia (HCC)   . Hepatitis C    never treated  . HIV (human immunodeficiency virus infection) (HCC)    Family History  Problem Relation Age of Onset  . CAD Mother   . Stroke Mother    Social History   Socioeconomic History  . Marital status: Single    Spouse name: Not on file  . Number of children: Not on file  . Years of education: Not on file  . Highest education level: Not on file  Social Needs  . Financial resource strain: Not on file  . Food insecurity - worry: Not on file  . Food insecurity - inability: Not on file  . Transportation needs - medical: Not on file  . Transportation needs - non-medical: Not on file  Occupational History  . Not on file  Tobacco Use  . Smoking status: Current Every Day Smoker    Packs/day: 2.00    Years: 38.00    Pack years: 76.00    Types: Cigarettes  . Smokeless tobacco: Never Used  Substance and Sexual Activity  . Alcohol use: Yes    Alcohol/week: 0.0 oz    Comment: 6 pack per week per patient   . Drug use: No  . Sexual activity: No    Partners: Male  Other Topics Concern  . Not on file  Social History Narrative  . Not on file   Current Meds  Medication Sig  . albuterol (PROVENTIL HFA;VENTOLIN HFA) 108 (90 Base) MCG/ACT inhaler Inhale 1-2 puffs into the lungs every 6 (six) hours as needed for wheezing or shortness of breath.  . cyclobenzaprine (FLEXERIL) 10 MG tablet Take 10 mg by mouth 3 (three) times daily as needed for muscle spasms.  . darunavir-cobicistat (PREZCOBIX) 800-150 MG tablet Take 1 tablet by mouth daily  with breakfast.  . diclofenac sodium (VOLTAREN) 1 % GEL Apply 2 g topically 4 (four) times daily.  . diphenhydrAMINE (BENADRYL) 25 mg capsule Take 25 mg by mouth every 6 (six) hours as needed for allergies.  Marland Kitchen dolutegravir (TIVICAY) 50 MG tablet Take 1 tablet (50 mg total) by mouth daily.  Marland Kitchen dronabinol (MARINOL) 2.5 MG capsule TAKE ONE CAPSULE BY MOUTH TWICE DAILY BEFORE A MEAL  . DULoxetine (CYMBALTA) 60 MG capsule TAKE 1 CAPSULE BY MOUTH DAILY  . emtricitabine-tenofovir AF (DESCOVY) 200-25 MG tablet Take 1 tablet by mouth daily.  Marland Kitchen ENSURE (ENSURE) Take 237 mLs by mouth 2 (two) times daily between meals. Do not take with medications.  . gabapentin (NEURONTIN) 300 MG capsule Take 1 capsule (300 mg total) by mouth 3 (three) times daily.  . hydroxypropyl methylcellulose (ISOPTO TEARS) 2.5 % ophthalmic solution Place 1 drop into both eyes 3 (three) times daily as needed for dry eyes.  Marland Kitchen lisinopril (PRINIVIL,ZESTRIL) 20 MG tablet Take 1 tablet (20 mg total) by mouth daily.  . meloxicam (MOBIC) 7.5 MG tablet Take 1 tablet (7.5 mg total) by mouth daily.  . mometasone-formoterol (DULERA) 100-5 MCG/ACT AERO Inhale 2 puffs into the lungs 2 (two) times daily.  Marland Kitchen  Multiple Vitamin (MULTIVITAMIN WITH MINERALS) TABS tablet Take 1 tablet by mouth daily.  Marland Kitchen terazosin (HYTRIN) 1 MG capsule Take 1 mg by mouth at bedtime.  . traZODone (DESYREL) 100 MG tablet Take 100 mg by mouth at bedtime.  . [DISCONTINUED] benzonatate (TESSALON) 100 MG capsule TAKE 1 CAPSULE(100 MG) BY MOUTH THREE TIMES DAILY AS NEEDED FOR COUGH  . [DISCONTINUED] chlorhexidine (PERIDEX) 0.12 % solution Use as directed 10 mLs in the mouth or throat 4 (four) times daily. Swish and spit   Allergies  Allergen Reactions  . Aspirin Other (See Comments)    "Free bleeder" affects platelets      ROS: As per HPI, remainder of ROS negative.   OBJECTIVE:   Vitals:   02/15/18 1200  BP: 134/85  Pulse: 90  Resp: (!) 22  Temp: 98 F (36.7 C)    SpO2: 99%     General appearance: alert; no distress Eyes: PERRL; EOMI; conjunctiva normal HENT: normocephalic; atraumatic; TMs normal, canal normal, external ears normal without trauma; nasal mucosa normal; oral mucosa normal Neck: supple Lungs: clear to auscultation bilaterally Heart: regular rate and rhythm Back: no CVA tenderness Extremities: no cyanosis or edema; symmetrical with no gross deformities Skin: warm and dry Neurologic: normal gait; grossly normal Psychological: alert and cooperative; normal mood and affect      Labs:  Results for orders placed or performed in visit on 01/09/18  HIV 1 RNA quant-no reflex-bld  Result Value Ref Range   HIV 1 RNA Quant <20 NOT DETECTED NOT DETECT copies/mL   HIV-1 RNA Quant, Log <1.30 NOT DETECTED NOT DETECT Log copies/mL  CBC  Result Value Ref Range   WBC 6.1 3.8 - 10.8 Thousand/uL   RBC 4.79 4.20 - 5.80 Million/uL   Hemoglobin 15.3 13.2 - 17.1 g/dL   HCT 40.9 81.1 - 91.4 %   MCV 92.7 80.0 - 100.0 fL   MCH 31.9 27.0 - 33.0 pg   MCHC 34.5 32.0 - 36.0 g/dL   RDW 78.2 95.6 - 21.3 %   Platelets 260 140 - 400 Thousand/uL   MPV 10.1 7.5 - 12.5 fL  Lipid panel  Result Value Ref Range   Cholesterol 158 <200 mg/dL   HDL 63 >08 mg/dL   Triglycerides 69 <657 mg/dL   LDL Cholesterol (Calc) 80 mg/dL (calc)   Total CHOL/HDL Ratio 2.5 <5.0 (calc)   Non-HDL Cholesterol (Calc) 95 <846 mg/dL (calc)  Comprehensive metabolic panel  Result Value Ref Range   Glucose, Bld 106 (H) 65 - 99 mg/dL   BUN 11 7 - 25 mg/dL   Creat 9.62 9.52 - 8.41 mg/dL   BUN/Creatinine Ratio NOT APPLICABLE 6 - 22 (calc)   Sodium 137 135 - 146 mmol/L   Potassium 4.3 3.5 - 5.3 mmol/L   Chloride 101 98 - 110 mmol/L   CO2 25 20 - 32 mmol/L   Calcium 10.4 (H) 8.6 - 10.3 mg/dL   Total Protein 8.5 (H) 6.1 - 8.1 g/dL   Albumin 4.9 3.6 - 5.1 g/dL   Globulin 3.6 1.9 - 3.7 g/dL (calc)   AG Ratio 1.4 1.0 - 2.5 (calc)   Total Bilirubin 0.5 0.2 - 1.2 mg/dL    Alkaline phosphatase (APISO) 56 40 - 115 U/L   AST 17 10 - 35 U/L   ALT 18 9 - 46 U/L  RPR  Result Value Ref Range   RPR Ser Ql REACTIVE (A) NON-REACTI  T-helper cell (CD4)- ([facility] clinic only)  Result Value Ref  Range   CD4 T Cell Abs 190 (L) 400 - 2,700 /uL   CD4 % Helper T Cell 13 (L) 33 - 55 %  Rpr titer  Result Value Ref Range   RPR Titer 1:2 (H)   Fluorescent treponemal ab(fta)-IgG-bld  Result Value Ref Range   Fluorescent Treponemal ABS REACTIVE (A) NON-REACTI    Labs Reviewed - No data to display  No results found.     ASSESSMENT & PLAN:  1. Viral URI with cough     Meds ordered this encounter  Medications  . chlorhexidine (PERIDEX) 0.12 % solution    Sig: Use as directed 10 mLs in the mouth or throat 4 (four) times daily. Swish and spit    Dispense:  120 mL    Refill:  1  . benzonatate (TESSALON) 100 MG capsule    Sig: TAKE 1 CAPSULE(100 MG) BY MOUTH THREE TIMES DAILY AS NEEDED FOR COUGH    Dispense:  60 capsule    Refill:  0  . HYDROcodone-homatropine (HYDROMET) 5-1.5 MG/5ML syrup    Sig: Take 5 mLs by mouth every 6 (six) hours as needed for cough.    Dispense:  60 mL    Refill:  0    Reviewed expectations re: course of current medical issues. Questions answered. Outlined signs and symptoms indicating need for more acute intervention. Patient verbalized understanding. After Visit Summary given.    Procedures:      Elvina SidleLauenstein, Maydelin Deming, MD 02/15/18 1237

## 2018-02-20 ENCOUNTER — Other Ambulatory Visit: Payer: Self-pay

## 2018-02-20 ENCOUNTER — Encounter: Payer: Self-pay | Admitting: Infectious Diseases

## 2018-02-20 ENCOUNTER — Ambulatory Visit (INDEPENDENT_AMBULATORY_CARE_PROVIDER_SITE_OTHER): Payer: Medicaid Other | Admitting: Infectious Diseases

## 2018-02-20 DIAGNOSIS — F1190 Opioid use, unspecified, uncomplicated: Secondary | ICD-10-CM

## 2018-02-20 DIAGNOSIS — B20 Human immunodeficiency virus [HIV] disease: Secondary | ICD-10-CM

## 2018-02-20 DIAGNOSIS — I10 Essential (primary) hypertension: Secondary | ICD-10-CM | POA: Diagnosis present

## 2018-02-20 DIAGNOSIS — B182 Chronic viral hepatitis C: Secondary | ICD-10-CM

## 2018-02-20 DIAGNOSIS — F1199 Opioid use, unspecified with unspecified opioid-induced disorder: Secondary | ICD-10-CM | POA: Diagnosis not present

## 2018-02-20 MED ORDER — BUPRENORPHINE HCL-NALOXONE HCL 4-1 MG SL FILM: 1.0000 | ORAL_FILM | Freq: Every day | SUBLINGUAL | 0 refills | Status: DC

## 2018-02-20 NOTE — Addendum Note (Signed)
Addended by: Lurlean LeydenPOOLE, Yukiko Minnich F on: 02/20/2018 12:25 PM   Modules accepted: Orders

## 2018-02-20 NOTE — Assessment & Plan Note (Addendum)
Will start him on suboxone today, rtc in 1 week to increase dose if needed.  Check UDS today Get him into counseling, [facility] (states he has "after care," has completed classes).    A review of the state narcotics DB shows only hydromet and promethazine-codiene as his only narcotic rx's in last year.

## 2018-02-20 NOTE — Assessment & Plan Note (Signed)
Will continue to follow his CMP Will need yearly re-eval with his subs use d/o.

## 2018-02-20 NOTE — Progress Notes (Signed)
   Subjective:    Patient ID: Steven [name], male    DOB: 01/19/1966, 52 y.o.   MRN: [id number]  HPI 52yo M with hx of HIV+ since 1985 (previously on Darunavir-cobi/descovey/tivicay), [facility]. CVA 2010.  He had anal pap 06-2011 which showed ASCUS. Has been seen at [facility]  Most recently on tivicay-descovy.  Was in hospital 11-2017 after using drugs. Had "muscle breakdown".  Review of his record shows that his tox screen was positive for cocaine/oxy/amphetamines/thc. He was found to have AKI with Cr up to 1.75. His Ck was up to 7,800.  Is scheduled to start suboxone after he gets enrolled [facility].  [facility] is [facility].  Previously using suboxone 8mg  (without prescription). We tried to set him up with IM clinic but they were not able to contact him for appt.  He states he has been drug free. Shows me his arms.  Has been having difficulty getting his marinol.  Has been coughing, got hycodan cough syrup. Would like refill.  Has been taking his BP rx and terazosin.  He is going to be seen by dental.   HIV 1 RNA Quant (copies/mL)  Date Value  01/09/2018 <20 NOT DETECTED  05/08/2017 46 (H)  01/08/2017 <20 DETECTED (A)   CD4 T Cell Abs (/uL)  Date Value  01/09/2018 190 (L)  05/08/2017 220 (L)  01/08/2017 220 (L)     Review of Systems  Constitutional: Negative for appetite change and unexpected weight change.  Respiratory: Positive for cough.   Gastrointestinal: Negative for constipation and diarrhea.  Genitourinary: Negative for difficulty urinating.  Psychiatric/Behavioral: Negative for suicidal ideas.  Please see HPI. All other systems reviewed and negative.      Objective:   Physical Exam  Constitutional: He is oriented to person, place, and time. He appears well-developed and well-nourished.  HENT:  Mouth/Throat: No oropharyngeal exudate.  Eyes: EOM are normal. Pupils are equal, round, and reactive to light.    Neck: Neck supple.  Cardiovascular: Normal rate, regular rhythm and normal heart sounds.  Pulmonary/Chest: Effort normal and breath sounds normal.  Abdominal: Soft. Bowel sounds are normal. There is no tenderness. There is no rebound.  Lymphadenopathy:    He has no cervical adenopathy.  Neurological: He is alert and oriented to person, place, and time.  Psychiatric: He has a normal mood and affect.  Stream of speech.  He does not appear intoxicated today.           Assessment & Plan:

## 2018-02-20 NOTE — Assessment & Plan Note (Signed)
He appears to be doing well.  He has stable housing.  Offered/refused condoms.   rtc in 1 week.

## 2018-02-20 NOTE — Assessment & Plan Note (Signed)
He is taking his rx, doing well.

## 2018-02-23 LAB — URINE DRUGS OF ABUSE SCREEN W ALC, ROUTINE (REF LAB)
ALCOHOL, ETHYL (U): NEGATIVE
AMPHETAMINES (1000 ng/mL SCRN): NEGATIVE
BARBITURATES: NEGATIVE
BENZODIAZEPINES: NEGATIVE
COCAINE METABOLITES: NEGATIVE
MARIJUANA MET (50 NG/ML SCRN): POSITIVE — AB
METHADONE: NEGATIVE
METHAQUALONE: NEGATIVE
OPIATES: NEGATIVE
PHENCYCLIDINE: NEGATIVE
PROPOXYPHENE: NEGATIVE

## 2018-02-26 ENCOUNTER — Ambulatory Visit (INDEPENDENT_AMBULATORY_CARE_PROVIDER_SITE_OTHER): Payer: Medicaid Other | Admitting: Infectious Diseases

## 2018-02-26 ENCOUNTER — Other Ambulatory Visit: Payer: Self-pay

## 2018-02-26 VITALS — BP 166/75 | HR 75 | Temp 98.1°F

## 2018-02-26 DIAGNOSIS — Z720 Tobacco use: Secondary | ICD-10-CM | POA: Diagnosis present

## 2018-02-26 DIAGNOSIS — F1199 Opioid use, unspecified with unspecified opioid-induced disorder: Secondary | ICD-10-CM

## 2018-02-26 DIAGNOSIS — F1190 Opioid use, unspecified, uncomplicated: Secondary | ICD-10-CM

## 2018-02-26 MED ORDER — NICOTINE 21 MG/24HR TD PT24
21.0000 mg | MEDICATED_PATCH | Freq: Every day | TRANSDERMAL | 0 refills | Status: DC
Start: 2018-02-26 — End: 2018-03-06

## 2018-02-26 MED ORDER — BUPRENORPHINE HCL-NALOXONE HCL 8-2 MG SL FILM: 1.0000 | ORAL_FILM | Freq: Every day | SUBLINGUAL | 0 refills | Status: DC

## 2018-02-26 NOTE — Addendum Note (Signed)
Addended by: Mariea ClontsGREEN, Gunnard Dorrance D on: 02/26/2018 02:44 PM   Modules accepted: Orders

## 2018-02-26 NOTE — Assessment & Plan Note (Signed)
A new rx is written for him for nicotine patches.

## 2018-02-26 NOTE — Telephone Encounter (Signed)
Patient is here today for [employer]

## 2018-02-26 NOTE — Assessment & Plan Note (Addendum)
Discussed his case with colleagues, will increase his dose to 8mg  He feels well, no sfx Narcotic database shows no other rxs He continues in counseling.  Will see him back in 1 week.

## 2018-02-26 NOTE — Progress Notes (Signed)
   Subjective:    Patient ID: Steven [name], male    DOB: 08/11/1966, 52 y.o.   MRN: [id number]  HPI Maintenance Visit documentation  .[email or web address]    02/26/2018  Steven [name] presents for follow up of opioid use disorder I have reviewed the prior induction visit, follow up visits, and telephone encounters relevant to opiate use disorder (OUD) treatment.   Current daily dose: 4mg   Date of Induction: 02-20-18  Current follow up interval, in weeks: 1  The patient has been adherent with the buprenorphine for OUD contract.   Last UDS Result: 02-20-18 (marijuana)  HPI: feeling better than he has ever felt.  Is interested in increased dose.  [facility]  [facility]  Having normal BM and urine.   Exam:   Vitals:   02/26/18 1000  BP: (!) 166/75  Pulse: 75  Temp: 98.1 F (36.7 C)  TempSrc: Oral   No distress.  Does not appear intoxicated.    Assessment/Plan:  See Problem Based Charting in the Encounters Tab  The [place]CR[place] was [place] at the time of visit: no new rx's since last visit except suboxone.  [email or web address]   Ginnie SmartHatcher, Christoph Copelan C, MD  02/26/2018  10:39 AM

## 2018-02-27 ENCOUNTER — Other Ambulatory Visit: Payer: Self-pay | Admitting: Infectious Diseases

## 2018-03-03 LAB — PAIN MGMT, PROFILE 8 W/CONF, U
6 Acetylmorphine: NEGATIVE ng/mL (ref <10)
ALPHAHYDROXYMIDAZOLAM: NEGATIVE ng/mL (ref <50)
AMINOCLONAZEPAM: NEGATIVE ng/mL (ref <25)
Alcohol Metabolites: POSITIVE ng/mL — AB (ref <500)
Alphahydroxyalprazolam: 76 ng/mL — ABNORMAL HIGH (ref <25)
Alphahydroxytriazolam: NEGATIVE ng/mL (ref <50)
Amphetamines: NEGATIVE ng/mL (ref <500)
BENZODIAZEPINES: POSITIVE ng/mL — AB (ref <100)
BENZOYLECGONINE: 2149 ng/mL — AB (ref <100)
BUPRENORPHINE: 24.3 ng/mL — AB (ref <2)
Buprenorphine, Urine: POSITIVE ng/mL — AB (ref <5)
Cocaine Metabolite: POSITIVE ng/mL — AB (ref <150)
Creatinine: 93.4 mg/dL
Ethyl Glucuronide (ETG): 85286 ng/mL — ABNORMAL HIGH (ref <500)
Ethyl Sulfate (ETS): 21169 ng/mL — ABNORMAL HIGH (ref <100)
HYDROXYETHYLFLURAZEPAM: NEGATIVE ng/mL (ref <50)
Lorazepam: NEGATIVE ng/mL (ref <50)
MARIJUANA METABOLITE: 329 ng/mL — AB (ref <5)
MARIJUANA METABOLITE: POSITIVE ng/mL — AB (ref <20)
MDMA: NEGATIVE ng/mL (ref <500)
NORDIAZEPAM: NEGATIVE ng/mL (ref <50)
Norbuprenorphine: 271.9 ng/mL — ABNORMAL HIGH (ref <2)
OXYCODONE: NEGATIVE ng/mL (ref <100)
Opiates: NEGATIVE ng/mL (ref <100)
Oxazepam: NEGATIVE ng/mL (ref <50)
Oxidant: NEGATIVE ug/mL (ref <200)
TEMAZEPAM: NEGATIVE ng/mL (ref <50)
pH: 6.83 (ref 4.5–9.0)

## 2018-03-04 ENCOUNTER — Other Ambulatory Visit: Payer: Self-pay | Admitting: Infectious Diseases

## 2018-03-06 ENCOUNTER — Encounter: Payer: Self-pay | Admitting: Infectious Diseases

## 2018-03-06 ENCOUNTER — Other Ambulatory Visit: Payer: Medicaid Other

## 2018-03-06 ENCOUNTER — Ambulatory Visit: Admission: RE | Admit: 2018-03-06 | Payer: Self-pay | Source: Ambulatory Visit

## 2018-03-06 ENCOUNTER — Ambulatory Visit (INDEPENDENT_AMBULATORY_CARE_PROVIDER_SITE_OTHER): Payer: Medicaid Other | Admitting: Infectious Diseases

## 2018-03-06 DIAGNOSIS — F1199 Opioid use, unspecified with unspecified opioid-induced disorder: Secondary | ICD-10-CM

## 2018-03-06 DIAGNOSIS — Z720 Tobacco use: Secondary | ICD-10-CM | POA: Diagnosis not present

## 2018-03-06 DIAGNOSIS — F1190 Opioid use, unspecified, uncomplicated: Secondary | ICD-10-CM

## 2018-03-06 DIAGNOSIS — F1110 Opioid abuse, uncomplicated: Secondary | ICD-10-CM

## 2018-03-06 MED ORDER — NICOTINE 21 MG/24HR TD PT24: 21.0000 mg | MEDICATED_PATCH | Freq: Every day | TRANSDERMAL | 0 refills | Status: DC

## 2018-03-06 MED ORDER — BUPRENORPHINE HCL-NALOXONE HCL 8-2 MG SL FILM: 1.0000 | ORAL_FILM | Freq: Every day | SUBLINGUAL | 0 refills | Status: DC

## 2018-03-06 NOTE — Progress Notes (Signed)
   Subjective:    Patient ID: Steven [name], male    DOB: 07/14/1966, 52 y.o.   MRN: [id number]  HPI    Review of Systems     Objective:   Physical Exam        Assessment & Plan:

## 2018-03-06 NOTE — Progress Notes (Signed)
Patient ID: Steven [name], male   DOB: 07/20/1966, 52 y.o.   MRN: [id number] Maintenance Visit documentation  .[email or web address]    03/06/2018  Steven [name] presents for follow up of opioid use disorder I have reviewed the prior induction visit, follow up visits, and telephone encounters relevant to opiate use disorder (OUD) treatment.   Current daily dose: 8mg  daily  Date of Induction: 02-20-18  Current follow up interval, in weeks: weekly  The patient has been adherent with the buprenorphine for OUD contract.   Last UDS Result: + bupe, cocaine, ETOH, marijuana, BZD  HPI: has been seen by [facility] CM.  We discussed his drug result from previous visit.   Exam:  Eomi, PErrl Mouth- no lesions Chest- cta cv-rrr abd- bs+, soft, non-tender extr- no edema Psych- animated as usual.   Vitals:   03/06/18 1131  BP: 112/75  Pulse: 87  Temp: 98 F (36.7 C)  TempSrc: Oral  SpO2: 99%  Weight: 175 lb (79.4 kg)  Height: 5\' 11"  (1.803 m)     Assessment/Plan:  See Problem Based Charting in the Encounters Tab  The [place]CRS [[email or web address].pmpaware.[email or web address]] was reviewed at the time of visit, no new rx's noted aside from this prescriber.    Ginnie SmartHatcher, Jeffrey C, MD  03/06/2018  12:31 PM

## 2018-03-06 NOTE — Assessment & Plan Note (Signed)
Will refill his patches.

## 2018-03-06 NOTE — Telephone Encounter (Signed)
Patient is here for Suboxone script.   Laurell Josephsammy K Rivaan Kendall, RN

## 2018-03-06 NOTE — Assessment & Plan Note (Signed)
Discussed his drug screen results Still in counseling.  Will refill him at 8mg  Will see him back in 1 week.

## 2018-03-09 LAB — PAIN MGMT, PROFILE 8 W/CONF, U
6 ACETYLMORPHINE: NEGATIVE ng/mL (ref <10)
ALCOHOL METABOLITES: POSITIVE ng/mL — AB (ref <500)
ALPHAHYDROXYTRIAZOLAM: NEGATIVE ng/mL (ref <50)
AMPHETAMINE: NEGATIVE ng/mL (ref <250)
Alphahydroxyalprazolam: 31 ng/mL — ABNORMAL HIGH (ref <25)
Alphahydroxymidazolam: NEGATIVE ng/mL (ref <50)
Aminoclonazepam: 407 ng/mL — ABNORMAL HIGH (ref <25)
Amphetamines: POSITIVE ng/mL — AB (ref <500)
Benzodiazepines: POSITIVE ng/mL — AB (ref <100)
Benzoylecgonine: 45749 ng/mL — ABNORMAL HIGH (ref <100)
Buprenorphine, Urine: NEGATIVE ng/mL (ref <5)
COCAINE METABOLITE: POSITIVE ng/mL — AB (ref <150)
Creatinine: 72.9 mg/dL
Ethyl Glucuronide (ETG): 7880 ng/mL — ABNORMAL HIGH (ref <500)
Ethyl Sulfate (ETS): 5061 ng/mL — ABNORMAL HIGH (ref <100)
HYDROXYETHYLFLURAZEPAM: NEGATIVE ng/mL (ref <50)
LORAZEPAM: NEGATIVE ng/mL (ref <50)
MARIJUANA METABOLITE: POSITIVE ng/mL — AB (ref <20)
MDMA: NEGATIVE ng/mL (ref <500)
METHAMPHETAMINE: 1554 ng/mL — AB (ref <250)
Marijuana Metabolite: 185 ng/mL — ABNORMAL HIGH (ref <5)
Nordiazepam: NEGATIVE ng/mL (ref <50)
OPIATES: NEGATIVE ng/mL (ref <100)
OXAZEPAM: NEGATIVE ng/mL (ref <50)
Oxidant: NEGATIVE ug/mL (ref <200)
Oxycodone: NEGATIVE ng/mL (ref <100)
Temazepam: NEGATIVE ng/mL (ref <50)
pH: 6.72 (ref 4.5–9.0)

## 2018-03-11 NOTE — Progress Notes (Unsigned)
lab

## 2018-03-13 ENCOUNTER — Encounter: Payer: Self-pay | Admitting: Infectious Diseases

## 2018-03-13 ENCOUNTER — Other Ambulatory Visit: Payer: Medicaid Other

## 2018-03-13 ENCOUNTER — Ambulatory Visit (INDEPENDENT_AMBULATORY_CARE_PROVIDER_SITE_OTHER): Payer: Medicaid Other | Admitting: Infectious Diseases

## 2018-03-13 DIAGNOSIS — B20 Human immunodeficiency virus [HIV] disease: Secondary | ICD-10-CM | POA: Diagnosis not present

## 2018-03-13 DIAGNOSIS — F1190 Opioid use, unspecified, uncomplicated: Secondary | ICD-10-CM

## 2018-03-13 DIAGNOSIS — F1199 Opioid use, unspecified with unspecified opioid-induced disorder: Secondary | ICD-10-CM | POA: Diagnosis not present

## 2018-03-13 MED ORDER — BUPRENORPHINE HCL-NALOXONE HCL 12-3 MG SL FILM: 1.0000 | ORAL_FILM | Freq: Every day | SUBLINGUAL | 0 refills | Status: DC

## 2018-03-13 NOTE — Addendum Note (Signed)
Addended by: Yanelle Sousa C on: 03/13/2018 03:09 PM   Modules accepted: Orders

## 2018-03-13 NOTE — Assessment & Plan Note (Signed)
Will increase his dose to 12 He has no constipation [facility] Repeat UDS today rtc in 1 week

## 2018-03-13 NOTE — Assessment & Plan Note (Addendum)
He has condoms Has new relationship.  Advance planning documents Will see him back next week.

## 2018-03-13 NOTE — Progress Notes (Signed)
Maintenance Visit documentation  .[email or web address]    03/13/2018  Steven [name] presents for follow up of opioid use disorder I have reviewed the prior induction visit, follow up visits, and telephone encounters relevant to opiate use disorder (OUD) treatment.   Current daily dose: 8mg   Date of Induction: 02-20-18  Current follow up interval, in weeks: weekly  The patient has been adherent with the buprenorphine for OUD contract.   Last UDS Result: benzo, amphetamines, ETOH, buponorphine,   HPI: Doing well, has been having cravings.  Would like increased dose.  He denies recent drug use despite his UDS result.   Exam:   Vitals:   03/13/18 1339  BP: 132/84  Pulse: 75  Temp: 98.5 F (36.9 [name])  TempSrc: Oral  Weight: 182 lb (82.6 kg)   Eyes- EOMI, PERRL Mouth- without lesion Neck- nontender. No LAN Chest- CTA CV- RRR Abd- BS+, Soft, nontender Extr- no edema  Assessment/Plan:  See Problem Based Charting in the Encounters Tab  The [place]CRS [[email or web address].pmpaware.[email or web address]] was reviewed at the time of visit. It shows no other rxs.    Steven [name], Steven Rothermel C, MD  03/13/2018  1:59 PM

## 2018-03-17 LAB — PAIN MGMT, PROFILE 8 W/CONF, U
6 ACETYLMORPHINE: NEGATIVE ng/mL (ref <10)
ALPHAHYDROXYTRIAZOLAM: NEGATIVE ng/mL (ref <50)
Alcohol Metabolites: POSITIVE ng/mL — AB (ref <500)
Alphahydroxyalprazolam: NEGATIVE ng/mL (ref <25)
Alphahydroxymidazolam: NEGATIVE ng/mL (ref <50)
Aminoclonazepam: 64 ng/mL — ABNORMAL HIGH (ref <25)
Amphetamines: NEGATIVE ng/mL (ref <500)
BENZOYLECGONINE: 2013 ng/mL — AB (ref <100)
Benzodiazepines: POSITIVE ng/mL — AB (ref <100)
Buprenorphine, Urine: POSITIVE ng/mL — AB (ref <5)
Buprenorphine: 17.9 ng/mL — ABNORMAL HIGH (ref <2)
Cocaine Metabolite: POSITIVE ng/mL — AB (ref <150)
Creatinine: 49.5 mg/dL
Ethyl Glucuronide (ETG): 12174 ng/mL — ABNORMAL HIGH (ref <500)
Ethyl Sulfate (ETS): 4051 ng/mL — ABNORMAL HIGH (ref <100)
Hydroxyethylflurazepam: NEGATIVE ng/mL (ref <50)
Lorazepam: NEGATIVE ng/mL (ref <50)
MARIJUANA METABOLITE: POSITIVE ng/mL — AB (ref <20)
MDMA: NEGATIVE ng/mL (ref <500)
Marijuana Metabolite: 405 ng/mL — ABNORMAL HIGH (ref <5)
NORBUPRENORPHINE: 258.3 ng/mL — AB (ref <2)
Nordiazepam: NEGATIVE ng/mL (ref <50)
OPIATES: NEGATIVE ng/mL (ref <100)
OXAZEPAM: NEGATIVE ng/mL (ref <50)
Oxidant: NEGATIVE ug/mL (ref <200)
Oxycodone: NEGATIVE ng/mL (ref <100)
PH: 6.86 (ref 4.5–9.0)
Temazepam: NEGATIVE ng/mL (ref <50)

## 2018-03-20 ENCOUNTER — Ambulatory Visit (INDEPENDENT_AMBULATORY_CARE_PROVIDER_SITE_OTHER): Payer: Medicaid Other | Admitting: Infectious Diseases

## 2018-03-20 ENCOUNTER — Encounter: Payer: Self-pay | Admitting: Infectious Diseases

## 2018-03-20 DIAGNOSIS — B20 Human immunodeficiency virus [HIV] disease: Secondary | ICD-10-CM | POA: Diagnosis not present

## 2018-03-20 DIAGNOSIS — B182 Chronic viral hepatitis C: Secondary | ICD-10-CM

## 2018-03-20 DIAGNOSIS — F1199 Opioid use, unspecified with unspecified opioid-induced disorder: Secondary | ICD-10-CM

## 2018-03-20 DIAGNOSIS — F1190 Opioid use, unspecified, uncomplicated: Secondary | ICD-10-CM

## 2018-03-20 MED ORDER — BUPRENORPHINE HCL-NALOXONE HCL 12-3 MG SL FILM: 1.0000 | ORAL_FILM | Freq: Every day | SUBLINGUAL | 0 refills | Status: DC

## 2018-03-20 NOTE — Assessment & Plan Note (Signed)
He is doing well He is considering new relationship.  Offered condoms.   HIV 1 RNA Quant (copies/mL)  Date Value  01/09/2018 <20 NOT DETECTED  05/08/2017 46 (H)  01/08/2017 <20 DETECTED (A)   CD4 T Cell Abs (/uL)  Date Value  01/09/2018 190 (L)  05/08/2017 220 (L)  01/08/2017 220 (L)

## 2018-03-20 NOTE — Progress Notes (Signed)
Maintenance Visit documentation  .[email or web address]    03/20/2018  Steven [name] presents for follow up of opioid use disorder I have reviewed the prior induction visit, follow up visits, and telephone encounters relevant to opiate use disorder (OUD) treatment.   Current daily dose: 12 mg  Date of Induction: 02-20-18  Current follow up interval, in weeks: weekly  The patient has been adherent with the buprenorphine for OUD contract.   Last UDS Result: + bupe, cocaine, THC, benzo, ETOH  HPI: 52 yo M with HIV+, opioid use disorder. He has also been using cocaine, benzos, ETOH.  He continues to follow at [facility] (daily), with [facility] and [facility]  He has not been having problems with his BM (normal, no constipation).  No cravings.  Feeling much better.   Exam:   Vitals:   03/20/18 1030  Weight: 179 lb (81.2 kg)   EOMI, PERRL Mouth- no lesions.  Neck- nontender, no LAN Chest- cta CV- RRR abd- BS+, soft, non-tender Extr- no edema.   Assessment/Plan:  See Problem Based Charting in the Encounters Tab  The [place]CRS [[email or web address]/ was reviewed at the time of visit, no other prescribers have written opioids for this pt.   Ginnie SmartHatcher, Shaeley Segall C, MD  03/20/2018  10:32 AM

## 2018-03-20 NOTE — Assessment & Plan Note (Signed)
He signs medication contract Will continue his suboxone at 12mg  daily.  Will change him to 3 week appts Will check his urine tox today.

## 2018-03-20 NOTE — Assessment & Plan Note (Signed)
Treated, will need f/u u/s at next medical visit.

## 2018-03-20 NOTE — Addendum Note (Signed)
Addended by: Mariea ClontsGREEN, Rudie Sermons D on: 03/20/2018 12:16 PM   Modules accepted: Orders

## 2018-03-23 LAB — PAIN MGMT, PROFILE 8 W/CONF, U
6 Acetylmorphine: NEGATIVE ng/mL (ref <10)
AMPHETAMINES: NEGATIVE ng/mL (ref <500)
Alcohol Metabolites: POSITIVE ng/mL — AB (ref <500)
BENZOYLECGONINE: 1468 ng/mL — AB (ref <100)
BUPRENORPHINE: 49.2 ng/mL — AB (ref <2)
Benzodiazepines: NEGATIVE ng/mL (ref <100)
Buprenorphine, Urine: POSITIVE ng/mL — AB (ref <5)
COCAINE METABOLITE: POSITIVE ng/mL — AB (ref <150)
CREATININE: 76.6 mg/dL
ETHYL GLUCURONIDE (ETG): 125548 ng/mL — AB (ref <500)
Ethyl Sulfate (ETS): 21154 ng/mL — ABNORMAL HIGH (ref <100)
MARIJUANA METABOLITE: POSITIVE ng/mL — AB (ref <20)
MDMA: NEGATIVE ng/mL (ref <500)
Marijuana Metabolite: 142 ng/mL — ABNORMAL HIGH (ref <5)
NORBUPRENORPHINE: 774.5 ng/mL — AB (ref <2)
OXIDANT: NEGATIVE ug/mL (ref <200)
Opiates: NEGATIVE ng/mL (ref <100)
Oxycodone: NEGATIVE ng/mL (ref <100)
PH: 7.12 (ref 4.5–9.0)

## 2018-04-10 ENCOUNTER — Encounter: Payer: Self-pay | Admitting: Infectious Diseases

## 2018-04-10 ENCOUNTER — Ambulatory Visit (INDEPENDENT_AMBULATORY_CARE_PROVIDER_SITE_OTHER): Payer: Self-pay | Admitting: Infectious Diseases

## 2018-04-10 VITALS — BP 122/86 | HR 89 | Temp 97.8°F | Ht 71.0 in | Wt 181.4 lb

## 2018-04-10 DIAGNOSIS — F1190 Opioid use, unspecified, uncomplicated: Secondary | ICD-10-CM

## 2018-04-10 DIAGNOSIS — B20 Human immunodeficiency virus [HIV] disease: Secondary | ICD-10-CM

## 2018-04-10 DIAGNOSIS — F1199 Opioid use, unspecified with unspecified opioid-induced disorder: Secondary | ICD-10-CM

## 2018-04-10 MED ORDER — BUPRENORPHINE HCL-NALOXONE HCL 8.6-2.1 MG SL SUBL: 1.0000 | SUBLINGUAL_TABLET | Freq: Two times a day (BID) | SUBLINGUAL | 0 refills | Status: DC | PRN

## 2018-04-10 NOTE — Progress Notes (Signed)
Maintenance Visit documentation  .[email or web address]    04/10/2018  Steven [name] presents for follow up of opioid use disorder I have reviewed the prior induction visit, follow up visits, and telephone encounters relevant to opiate use disorder (OUD) treatment.   Current daily dose: 12   Date of Induction: 02-20-18  Current follow up interval, in weeks: 3  The patient has not been adherent with the buprenorphine for OUD contract.   Last UDS Result: cocaine, marijuana, ETOH, Bupe,   HPI: He continues on suboxone.  He would like increased dose today, he increased the dose himself. He has had more cravings. States he was shooting 25 bags/day.  He continues to f/u at [facility], with [facility] counselor[facility].   Exam:   Vitals:   04/10/18 1028  BP: 122/86  Pulse: 89  Temp: 97.8 F (36.6 C)  SpO2: 97%  Weight: 181 lb 6.4 oz (82.3 kg)  Height:  (1.803 m)   Eyes- EOMI, PERRL Neck- nontender, no LAN Chest- CTA CV- RRR Abd- BS+, soft, nontender Extr- no edema.    Assessment/Plan:  See Problem Based Charting in the Encounters Tab  The [place]CRS [[email or web address] was reviewed at the time of visit. He has filled no other rx's, from other providers.   Will increase his dose as he requests. We agree to try .  Will see him back in 3 weeks.  If he continues to have positive screens for other rx, will conisder sending him to separate site.  I am not sure of the utility of increasing his suboxone dose above 24.   He has been compliant with his ART (descovey, prezcobix, tivicay).  Will check his labs in June  HIV 1 RNA Quant (copies/mL)  Date Value  01/09/2018 <20 NOT DETECTED  05/08/2017 46 (H)  01/08/2017 <20 DETECTED (A)   CD4 T Cell Abs (/uL)  Date Value  01/09/2018 190 (L)  05/08/2017 220 (L)  01/08/2017 220 (L)     Ginnie Smart, MD  04/10/2018  11:06 AM

## 2018-04-10 NOTE — Assessment & Plan Note (Signed)
See note Will increase his dose to 16 Will see back in 3 weeks Need to recheck his Hep C

## 2018-04-10 NOTE — Assessment & Plan Note (Signed)
Swears adherence.  Will check his labs in June, including Hep C as he has been actively using prior to statting suboxone

## 2018-04-12 LAB — PAIN MGMT, PROFILE 8 W/CONF, U
6 ACETYLMORPHINE: NEGATIVE ng/mL (ref <10)
ALPHAHYDROXYMIDAZOLAM: NEGATIVE ng/mL (ref <50)
ALPHAHYDROXYTRIAZOLAM: NEGATIVE ng/mL (ref <50)
AMINOCLONAZEPAM: NEGATIVE ng/mL (ref <25)
Alcohol Metabolites: POSITIVE ng/mL — AB (ref <500)
Alphahydroxyalprazolam: NEGATIVE ng/mL (ref <25)
Amphetamines: NEGATIVE ng/mL (ref <500)
Benzodiazepines: NEGATIVE ng/mL (ref <100)
Buprenorphine, Urine: NEGATIVE ng/mL (ref <5)
COCAINE METABOLITE: NEGATIVE ng/mL (ref <150)
Creatinine: 163.2 mg/dL
ETHYL SULFATE (ETS): 3195 ng/mL — AB (ref <100)
Ethyl Glucuronide (ETG): 4672 ng/mL — ABNORMAL HIGH (ref <500)
Hydroxyethylflurazepam: NEGATIVE ng/mL (ref <50)
LORAZEPAM: NEGATIVE ng/mL (ref <50)
MDMA: NEGATIVE ng/mL (ref <500)
Marijuana Metabolite: 599 ng/mL — ABNORMAL HIGH (ref <5)
Marijuana Metabolite: POSITIVE ng/mL — AB (ref <20)
NORDIAZEPAM: NEGATIVE ng/mL (ref <50)
OPIATES: NEGATIVE ng/mL (ref <100)
Oxazepam: NEGATIVE ng/mL (ref <50)
Oxidant: NEGATIVE ug/mL (ref <200)
Oxycodone: NEGATIVE ng/mL (ref <100)
TEMAZEPAM: NEGATIVE ng/mL (ref <50)
pH: 6.87 (ref 4.5–9.0)

## 2018-04-17 ENCOUNTER — Telehealth: Payer: Self-pay | Admitting: *Deleted

## 2018-04-17 NOTE — Progress Notes (Signed)
Was never able to speak w/ pt

## 2018-04-17 NOTE — Telephone Encounter (Signed)
Unable to reach pt

## 2018-05-01 ENCOUNTER — Encounter: Payer: Self-pay | Admitting: Infectious Diseases

## 2018-05-01 ENCOUNTER — Ambulatory Visit (INDEPENDENT_AMBULATORY_CARE_PROVIDER_SITE_OTHER): Payer: Medicaid Other | Admitting: Infectious Diseases

## 2018-05-01 DIAGNOSIS — F1190 Opioid use, unspecified, uncomplicated: Secondary | ICD-10-CM

## 2018-05-01 DIAGNOSIS — Z5900 Homelessness unspecified: Secondary | ICD-10-CM

## 2018-05-01 DIAGNOSIS — Z720 Tobacco use: Secondary | ICD-10-CM | POA: Diagnosis not present

## 2018-05-01 DIAGNOSIS — F1199 Opioid use, unspecified with unspecified opioid-induced disorder: Secondary | ICD-10-CM

## 2018-05-01 MED ORDER — BUPRENORPHINE HCL-NALOXONE HCL 8.6-2.1 MG SL SUBL: 1.0000 | SUBLINGUAL_TABLET | Freq: Two times a day (BID) | SUBLINGUAL | 0 refills | Status: AC | PRN

## 2018-05-01 NOTE — Addendum Note (Signed)
Addended by: Ronae Noell C on: 05/01/2018 10:15 AM   Modules accepted: Orders

## 2018-05-01 NOTE — Progress Notes (Signed)
Maintenance Visit documentation     05/01/2018  Steven [name] presents for follow up of opioid use disorder I have reviewed the prior induction visit, follow up visits, and telephone encounters relevant to opiate use disorder (OUD) treatment.   Current daily dose:  bid  Date of Induction: 02-20-18  Current follow up interval, in weeks: 3 weeks  The patient has ? been adherent with the buprenorphine for OUD contract.   Last UDS Result: negative Bupe, +etoh.   HPI: He is doing well. He expresses gratitude about his care. I asked him about he prev UDS (negative for sub). He swears adherence. He states he drinks a quart of beer every am.    Exam: he does not appear to be in w/d ( his mood is stable/consistent with previous, he is not tremulous)  Vitals:   05/01/18 0937  BP: 102/69  Pulse: 90  Temp: 98.8 F (37.1 C)  TempSrc: Oral  Weight: 187 lb (84.8 kg)    Assessment/Plan:  See Problem Based Charting in the Encounters Tab  The NCCRS [[email or web address] was reviewed at the time of visit: he has received no other rx's from other providers.   Ginnie Smart, MD  05/01/2018  9:54 AM

## 2018-05-01 NOTE — Addendum Note (Signed)
Addended byJimmy Picket F on: 05/01/2018 10:22 AM   Modules accepted: Orders

## 2018-05-01 NOTE — Assessment & Plan Note (Signed)
[place]  Follows a [facility].

## 2018-05-01 NOTE — Assessment & Plan Note (Signed)
Encouraged him to quit.  Hard to do with overlying psych illness.

## 2018-05-01 NOTE — Assessment & Plan Note (Addendum)
Urine pending from today. If negative, he will need to find alternative provider for suboxone.  [facility]  [facility].  Continue his current dose.  Will change freq to q4 weeks.  Get him bus passes.

## 2018-05-04 LAB — PAIN MGMT, PROFILE 8 W/CONF, U
6 Acetylmorphine: NEGATIVE ng/mL (ref <10)
ALCOHOL METABOLITES: POSITIVE ng/mL — AB (ref <500)
ALPHAHYDROXYALPRAZOLAM: NEGATIVE ng/mL (ref <25)
ALPHAHYDROXYTRIAZOLAM: NEGATIVE ng/mL (ref <50)
AMINOCLONAZEPAM: NEGATIVE ng/mL (ref <25)
Alphahydroxymidazolam: NEGATIVE ng/mL (ref <50)
Amphetamines: NEGATIVE ng/mL (ref <500)
BUPRENORPHINE: 131.7 ng/mL — AB (ref <2)
Benzodiazepines: NEGATIVE ng/mL (ref <100)
Buprenorphine, Urine: POSITIVE ng/mL — AB (ref <5)
CODEINE: NEGATIVE ng/mL (ref <50)
Cocaine Metabolite: NEGATIVE ng/mL (ref <150)
Creatinine: 156 mg/dL
Ethyl Glucuronide (ETG): 16979 ng/mL — ABNORMAL HIGH (ref <500)
Ethyl Sulfate (ETS): 5447 ng/mL — ABNORMAL HIGH (ref <100)
HYDROXYETHYLFLURAZEPAM: NEGATIVE ng/mL (ref <50)
Hydrocodone: NEGATIVE ng/mL (ref <50)
Hydromorphone: NEGATIVE ng/mL (ref <50)
Lorazepam: NEGATIVE ng/mL (ref <50)
MDMA: NEGATIVE ng/mL (ref <500)
Marijuana Metabolite: 502 ng/mL — ABNORMAL HIGH (ref <5)
Marijuana Metabolite: POSITIVE ng/mL — AB (ref <20)
Morphine: NEGATIVE ng/mL (ref <50)
Norbuprenorphine: 87.9 ng/mL — ABNORMAL HIGH (ref <2)
Nordiazepam: NEGATIVE ng/mL (ref <50)
Norhydrocodone: NEGATIVE ng/mL (ref <50)
OPIATES: NEGATIVE ng/mL (ref <100)
OXAZEPAM: NEGATIVE ng/mL (ref <50)
OXYCODONE: NEGATIVE ng/mL (ref <100)
Oxidant: NEGATIVE ug/mL (ref <200)
TEMAZEPAM: NEGATIVE ng/mL (ref <50)
pH: 6.59 (ref 4.5–9.0)

## 2018-05-06 ENCOUNTER — Other Ambulatory Visit: Payer: Self-pay | Admitting: Infectious Diseases

## 2018-05-06 DIAGNOSIS — B20 Human immunodeficiency virus [HIV] disease: Secondary | ICD-10-CM

## 2018-05-13 ENCOUNTER — Other Ambulatory Visit: Payer: Self-pay | Admitting: *Deleted

## 2018-05-13 ENCOUNTER — Telehealth: Payer: Self-pay | Admitting: *Deleted

## 2018-05-13 DIAGNOSIS — Z720 Tobacco use: Secondary | ICD-10-CM

## 2018-05-13 DIAGNOSIS — B20 Human immunodeficiency virus [HIV] disease: Secondary | ICD-10-CM

## 2018-05-13 MED ORDER — NICOTINE 21 MG/24HR TD PT24: 21.0000 mg | MEDICATED_PATCH | Freq: Every day | TRANSDERMAL | 1 refills | Status: DC

## 2018-05-13 MED ORDER — DOLUTEGRAVIR SODIUM 50 MG PO TABS: 50.0000 mg | ORAL_TABLET | Freq: Every day | ORAL | 5 refills | Status: DC

## 2018-05-13 MED ORDER — DARUNAVIR-COBICISTAT 800-150 MG PO TABS: 1.0000 | ORAL_TABLET | Freq: Every day | ORAL | 5 refills | Status: DC

## 2018-05-13 MED ORDER — EMTRICITABINE-TENOFOVIR AF 200-25 MG PO TABS: 1.0000 | ORAL_TABLET | Freq: Every day | ORAL | 5 refills | Status: DC

## 2018-05-13 NOTE — Telephone Encounter (Signed)
Patient walked in, asked for refills of his HIV medication and his Nicotine patches. RN confirmed patient's regimen of Prezcobix/Tivicay/Descovy with Dr Ninetta LightsHatcher. [facility]. Andree CossHowell, Kimm Ungaro M, RN

## 2018-05-15 ENCOUNTER — Encounter: Payer: Self-pay | Admitting: Infectious Diseases

## 2018-05-15 ENCOUNTER — Telehealth: Payer: Self-pay | Admitting: *Deleted

## 2018-05-15 DIAGNOSIS — R634 Abnormal weight loss: Secondary | ICD-10-CM | POA: Insufficient documentation

## 2018-05-15 NOTE — Telephone Encounter (Signed)
Patient will need a diagnosis related to malnutrition or weight loss on their problem list in order to continue on Ensure via [facility]. Please advise. Andree CossHowell, Michelle M, RN

## 2018-05-15 NOTE — Telephone Encounter (Signed)
Done. thanks

## 2018-05-22 ENCOUNTER — Telehealth: Payer: Self-pay | Admitting: *Deleted

## 2018-05-22 NOTE — Telephone Encounter (Signed)
Patient called to advise that he was doing yard work and fell into a bush of poison oak and now has a rash all over that is not getting better with calamine lotion. He would like something called in for the itching. Advised him will let his provider know and give him a call once he responds.

## 2018-06-03 ENCOUNTER — Encounter: Payer: Self-pay | Admitting: Infectious Diseases

## 2018-06-03 ENCOUNTER — Ambulatory Visit (INDEPENDENT_AMBULATORY_CARE_PROVIDER_SITE_OTHER): Payer: Medicaid Other | Admitting: Infectious Diseases

## 2018-06-03 VITALS — BP 143/95 | HR 97 | Temp 98.4°F | Ht 71.0 in | Wt 190.0 lb

## 2018-06-03 DIAGNOSIS — B20 Human immunodeficiency virus [HIV] disease: Secondary | ICD-10-CM

## 2018-06-03 DIAGNOSIS — R1111 Vomiting without nausea: Secondary | ICD-10-CM | POA: Diagnosis not present

## 2018-06-03 DIAGNOSIS — F1190 Opioid use, unspecified, uncomplicated: Secondary | ICD-10-CM

## 2018-06-03 DIAGNOSIS — K089 Disorder of teeth and supporting structures, unspecified: Secondary | ICD-10-CM

## 2018-06-03 DIAGNOSIS — F1199 Opioid use, unspecified with unspecified opioid-induced disorder: Secondary | ICD-10-CM | POA: Diagnosis not present

## 2018-06-03 MED ORDER — PROMETHAZINE HCL 25 MG RE SUPP: 25.0000 mg | Freq: Four times a day (QID) | RECTAL | 0 refills | Status: DC | PRN

## 2018-06-03 MED ORDER — BUPRENORPHINE HCL-NALOXONE HCL 8-2 MG SL FILM: 1.0000 | ORAL_FILM | Freq: Two times a day (BID) | SUBLINGUAL | 0 refills | Status: AC

## 2018-06-03 MED ORDER — CHLORHEXIDINE GLUCONATE 0.12 % MT SOLN: 10.0000 mL | Freq: Four times a day (QID) | OROMUCOSAL | 1 refills | Status: DC

## 2018-06-03 NOTE — Assessment & Plan Note (Signed)
Will refill his suboxonne.  He appears to be doing well.  He states his sx are at baseline, has no cravings.  Await his UDS.  [facility]  [facility] Has f/u with [facility] CM- Ladona Ridgelaylor.

## 2018-06-03 NOTE — Assessment & Plan Note (Signed)
He appears to be doing well He will get his labs redone on Nov visit.

## 2018-06-03 NOTE — Progress Notes (Signed)
   Subjective:    Patient ID: Steven [name], male    DOB: 08/22/1966, 52 y.o.   MRN: [id number]      06/03/2018  Steven [name] presents for follow up of opioid use disorder I have reviewed the prior induction visit, follow up visits, and telephone encounters relevant to opiate use disorder (OUD) treatment.  [email or web address].[email or web address]/  HIV 1 RNA Quant (copies/mL)  Date Value  01/09/2018 <20 NOT DETECTED  05/08/2017 46 (H)  01/08/2017 <20 DETECTED (A)   CD4 T Cell Abs (/uL)  Date Value  01/09/2018 190 (L)  05/08/2017 220 (L)  01/08/2017 220 (L)    Current daily dose: 8 mg bid  Date of Induction:  02-20-18  Current follow up interval, in weeks: 4 weeks  The patient has been adherent with the buprenorphine for OUD contract.   Last UDS Result: (05-01-18) buprenorphine, ETOH, marijuana.   HPI: He is also on prezcobix, tivicay, descovy. He continues to get ensure from [facility].  He denies missed medications.  He gets transportation assistance from his roommate, bus pass.  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  No cravings, feelings of withdrawal.  "I'm good where I'm at".   Please see HPI. All other systems reviewed and negative.  Exam:   Vitals:   06/03/18 0938  BP: (!) 143/95  Pulse: 97  Temp: 98.4 F (36.9 C)  TempSrc: Oral  Weight: 190 lb (86.2 kg)  Height: 5\' 11"  (1.803 m)     Assessment/Plan:  See Problem Based Charting in the Encounters Tab  The NCCRS [[email or web address].[email or web address].[email or web address]/ was reviewed at the time of visit- I am his only prescriber.    Ginnie SmartHatcher, Teana Lindahl C, MD  06/03/2018  9:50 AM    Review of Systems  Constitutional: Negative for appetite change and unexpected weight change.  Respiratory: Positive for cough.   Gastrointestinal: Negative for constipation and diarrhea.  Genitourinary: Negative for difficulty urinating.  Please see HPI. All other systems reviewed and negative.      Objective:   Physical Exam    Constitutional: He appears well-developed and well-nourished.  HENT:  Mouth/Throat: No oropharyngeal exudate.  Eyes: Pupils are equal, round, and reactive to light. EOM are normal.  Neck: Normal range of motion. Neck supple.  Cardiovascular: Normal rate, regular rhythm and normal heart sounds.  Pulmonary/Chest: Effort normal and breath sounds normal.  Abdominal: Soft. Bowel sounds are normal.  Musculoskeletal: He exhibits no edema.  Psychiatric: His mood appears not anxious. His speech is rapid and/or pressured. He does not exhibit a depressed mood.  his mood is at his baseline.        Assessment & Plan:

## 2018-06-09 LAB — PAIN MGMT, PROFILE 8 W/CONF, U
6 Acetylmorphine: NEGATIVE ng/mL (ref <10)
ALCOHOL METABOLITES: POSITIVE ng/mL — AB (ref <500)
ALPHAHYDROXYALPRAZOLAM: NEGATIVE ng/mL (ref <25)
ALPHAHYDROXYTRIAZOLAM: NEGATIVE ng/mL (ref <50)
Alphahydroxymidazolam: NEGATIVE ng/mL (ref <50)
Aminoclonazepam: NEGATIVE ng/mL (ref <25)
Amphetamine: 510 ng/mL — ABNORMAL HIGH (ref <250)
Amphetamines: POSITIVE ng/mL — AB (ref <500)
BUPRENORPHINE, URINE: NEGATIVE ng/mL (ref <5)
Benzodiazepines: NEGATIVE ng/mL (ref <100)
Cocaine Metabolite: NEGATIVE ng/mL (ref <150)
Creatinine: 85.9 mg/dL
Ethyl Glucuronide (ETG): 6485 ng/mL — ABNORMAL HIGH (ref <500)
Ethyl Sulfate (ETS): 5126 ng/mL — ABNORMAL HIGH (ref <100)
HYDROXYETHYLFLURAZEPAM: NEGATIVE ng/mL (ref <50)
LORAZEPAM: NEGATIVE ng/mL (ref <50)
MARIJUANA METABOLITE: POSITIVE ng/mL — AB (ref <20)
MDMA: NEGATIVE ng/mL (ref <500)
METHAMPHETAMINE: 3426 ng/mL — AB (ref <250)
Marijuana Metabolite: 572 ng/mL — ABNORMAL HIGH (ref <5)
Nordiazepam: NEGATIVE ng/mL (ref <50)
OXAZEPAM: NEGATIVE ng/mL (ref <50)
OXYCODONE: NEGATIVE ng/mL (ref <100)
Opiates: NEGATIVE ng/mL (ref <100)
Oxidant: NEGATIVE ug/mL (ref <200)
Temazepam: NEGATIVE ng/mL (ref <50)
pH: 6.87 (ref 4.5–9.0)

## 2018-06-10 ENCOUNTER — Telehealth: Payer: Self-pay | Admitting: Infectious Diseases

## 2018-06-10 NOTE — Telephone Encounter (Signed)
Called pt to discuss his recent UDS- was negative for suboxone. He will need to find alternate provider for suboxone

## 2018-06-11 ENCOUNTER — Other Ambulatory Visit: Payer: Self-pay | Admitting: *Deleted

## 2018-06-11 DIAGNOSIS — Z21 Asymptomatic human immunodeficiency virus [HIV] infection status: Secondary | ICD-10-CM

## 2018-06-11 MED ORDER — ENSURE PO LIQD: 237.0000 mL | Freq: Two times a day (BID) | ORAL | 0 refills | Status: AC

## 2018-07-04 ENCOUNTER — Encounter: Payer: Self-pay | Admitting: Infectious Diseases

## 2018-07-04 ENCOUNTER — Ambulatory Visit (INDEPENDENT_AMBULATORY_CARE_PROVIDER_SITE_OTHER): Payer: Medicaid Other | Admitting: Infectious Diseases

## 2018-07-04 DIAGNOSIS — B20 Human immunodeficiency virus [HIV] disease: Secondary | ICD-10-CM

## 2018-07-04 DIAGNOSIS — F1190 Opioid use, unspecified, uncomplicated: Secondary | ICD-10-CM

## 2018-07-04 DIAGNOSIS — F1199 Opioid use, unspecified with unspecified opioid-induced disorder: Secondary | ICD-10-CM

## 2018-07-04 MED ORDER — BUPRENORPHINE HCL-NALOXONE HCL 8-2 MG SL FILM: 1.0000 | ORAL_FILM | Freq: Two times a day (BID) | SUBLINGUAL | 0 refills | Status: DC

## 2018-07-04 NOTE — Assessment & Plan Note (Addendum)
Doing well with prezcobix, descovey, tivicay.  Will refill for new pharmacy.  Offered/refused condoms.

## 2018-07-04 NOTE — Assessment & Plan Note (Signed)
Will check his Utox today.  He appears to be doing well Will see him back in 1 month.

## 2018-07-04 NOTE — Progress Notes (Signed)
   Subjective:    Patient ID: Steven [name], male    DOB: 01/15/1966, [age] y.o.   MRN: [id number]  HPI Maintenance Visit documentation  .[email or web address]    07/04/2018  Steven [name] presents for follow up of opioid use disorder I have reviewed the prior induction visit, follow up visits, and telephone encounters relevant to opiate use disorder (OUD) treatment.   Current daily dose: 16 mg  Date of Induction: 02-20-18  Current follow up interval, in weeks: 4 weeks  The patient has been adherent with the buprenorphine for OUD contract.   Last UDS Result: (06-03-18) +amphetamines, ETOH, negative buprenorphine  HPI: Has been doing well. Going to pharm today. Wants to change his pharmacy Had 1 episode of n/v since last visit. Was associated with headache.  Since improved.  He has f/u with his PCP next week.  [facility] with [facility] to recertify as well.  Does not want to increase his suboxone dose. Wants to taper his dose eventually.  No cravings, mild constipatoin.  Continues have back pain. Has been free of opioids.   Exam:   Vitals:   07/04/18 0912  BP: 134/90  Pulse: (!) 114  Temp: 97.7 F (36.5 C)  Weight: 192 lb (87.1 kg)  Height: 5\' 10"  (1.778 m)   Psych- speech rapid, tangential as usual.  CV- RRR Chest- CTA Abd-BS+, soft, non-tender.   Assessment/Plan:  See Problem Based Charting in the Encounters Tab  The NCCRS [[email or web address].[email or web address]/ was reviewed at the time of visit. There have no rx's except for suboxone.  Will refill his suboxone, he is going to [facility] which I agree to as it will be closer to his house.  [facility]    Ginnie SmartHatcher, Miamor Ayler C, MD  07/04/2018  9:23 AM

## 2018-07-08 LAB — PAIN MGMT, PROFILE 8 W/CONF, U
6 ACETYLMORPHINE: NEGATIVE ng/mL (ref <10)
ALCOHOL METABOLITES: POSITIVE ng/mL — AB (ref <500)
AMPHETAMINES: NEGATIVE ng/mL (ref <500)
BENZODIAZEPINES: NEGATIVE ng/mL (ref <100)
BUPRENORPHINE: 513 ng/mL — AB (ref <2)
Buprenorphine, Urine: POSITIVE ng/mL — AB (ref <5)
CREATININE: 27.8 mg/dL
Cocaine Metabolite: NEGATIVE ng/mL (ref <150)
ETHYL GLUCURONIDE (ETG): 13792 ng/mL — AB (ref <500)
Ethyl Sulfate (ETS): 4393 ng/mL — ABNORMAL HIGH (ref <100)
MARIJUANA METABOLITE: 124 ng/mL — AB (ref <5)
MARIJUANA METABOLITE: POSITIVE ng/mL — AB (ref <20)
MDMA: NEGATIVE ng/mL (ref <500)
Norbuprenorphine: 13.5 ng/mL — ABNORMAL HIGH (ref <2)
OPIATES: NEGATIVE ng/mL (ref <100)
Oxidant: NEGATIVE ug/mL (ref <200)
Oxycodone: NEGATIVE ng/mL (ref <100)
PH: 6.87 (ref 4.5–9.0)

## 2018-07-24 ENCOUNTER — Other Ambulatory Visit: Payer: Self-pay | Admitting: Infectious Diseases

## 2018-07-24 DIAGNOSIS — K089 Disorder of teeth and supporting structures, unspecified: Secondary | ICD-10-CM

## 2018-07-28 ENCOUNTER — Other Ambulatory Visit: Payer: Self-pay | Admitting: Infectious Diseases

## 2018-07-28 DIAGNOSIS — I10 Essential (primary) hypertension: Secondary | ICD-10-CM

## 2018-07-28 DIAGNOSIS — F320 Major depressive disorder, single episode, mild: Secondary | ICD-10-CM

## 2018-08-01 ENCOUNTER — Other Ambulatory Visit: Payer: Self-pay | Admitting: Infectious Diseases

## 2018-08-01 DIAGNOSIS — F1199 Opioid use, unspecified with unspecified opioid-induced disorder: Secondary | ICD-10-CM

## 2018-08-01 DIAGNOSIS — F1190 Opioid use, unspecified, uncomplicated: Secondary | ICD-10-CM

## 2018-08-01 MED ORDER — BUPRENORPHINE HCL-NALOXONE HCL 8-2 MG SL FILM: 1.0000 | ORAL_FILM | Freq: Two times a day (BID) | SUBLINGUAL | 0 refills | Status: AC

## 2018-08-01 NOTE — Progress Notes (Signed)
Pat asking for early refill of suboxonne as he will out of town next week.  His [place] narcotic database shows that I am the only prescriber for him.  Will check his UDS when he picks up rx.

## 2018-08-06 ENCOUNTER — Ambulatory Visit: Payer: Self-pay | Admitting: Infectious Diseases

## 2018-08-06 LAB — PAIN MGMT, PROFILE 8 W/CONF, U
6 Acetylmorphine: NEGATIVE ng/mL (ref <10)
ALCOHOL METABOLITES: POSITIVE ng/mL — AB (ref <500)
ALPHAHYDROXYALPRAZOLAM: NEGATIVE ng/mL (ref <25)
AMINOCLONAZEPAM: 311 ng/mL — AB (ref <25)
AMPHETAMINE: 4156 ng/mL — AB (ref <250)
AMPHETAMINES: POSITIVE ng/mL — AB (ref <500)
Alphahydroxymidazolam: NEGATIVE ng/mL (ref <50)
Alphahydroxytriazolam: NEGATIVE ng/mL (ref <50)
BENZODIAZEPINES: POSITIVE ng/mL — AB (ref <100)
Benzoylecgonine: 3553 ng/mL — ABNORMAL HIGH (ref <100)
Buprenorphine, Urine: POSITIVE ng/mL — AB (ref <5)
Buprenorphine: 135.3 ng/mL — ABNORMAL HIGH (ref <2)
CREATININE: 169.7 mg/dL
Cocaine Metabolite: POSITIVE ng/mL — AB (ref <150)
Codeine: NEGATIVE ng/mL (ref <50)
ETHYL GLUCURONIDE (ETG): 13888 ng/mL — AB (ref <500)
Ethyl Sulfate (ETS): 5327 ng/mL — ABNORMAL HIGH (ref <100)
HYDROCODONE: NEGATIVE ng/mL (ref <50)
HYDROMORPHONE: NEGATIVE ng/mL (ref <50)
Hydroxyethylflurazepam: NEGATIVE ng/mL (ref <50)
LORAZEPAM: NEGATIVE ng/mL (ref <50)
MDMA: NEGATIVE ng/mL (ref <500)
Marijuana Metabolite: 287 ng/mL — ABNORMAL HIGH (ref <5)
Marijuana Metabolite: POSITIVE ng/mL — AB (ref <20)
Methamphetamine: >40000 ng/mL — ABNORMAL HIGH (ref <250)
Morphine: NEGATIVE ng/mL (ref <50)
NOROXYCODONE: 851 ng/mL — AB (ref <50)
Norbuprenorphine: 108.2 ng/mL — ABNORMAL HIGH (ref <2)
Nordiazepam: NEGATIVE ng/mL (ref <50)
Norhydrocodone: NEGATIVE ng/mL (ref <50)
OXIDANT: NEGATIVE ug/mL (ref <200)
OXYCODONE: 3434 ng/mL — AB (ref <50)
OXYMORPHONE: 693 ng/mL — AB (ref <50)
Opiates: NEGATIVE ng/mL (ref <100)
Oxazepam: NEGATIVE ng/mL (ref <50)
Oxycodone: POSITIVE ng/mL — AB (ref <100)
PH: 6.43 (ref 4.5–9.0)
TEMAZEPAM: NEGATIVE ng/mL (ref <50)

## 2018-09-02 ENCOUNTER — Telehealth: Payer: Self-pay

## 2018-09-02 NOTE — Telephone Encounter (Signed)
Patient is calling today requesting to come into clinic on 10/01 for labs and for suboxonne scripts. Dr. Ninetta Lights is not in clinic to fill out script for patient. Will route message to Dr. Ninetta Lights to advise. Lorenso Courier, [place]

## 2018-09-03 ENCOUNTER — Other Ambulatory Visit: Payer: Medicaid Other

## 2018-09-03 ENCOUNTER — Telehealth: Payer: Self-pay | Admitting: *Deleted

## 2018-09-03 ENCOUNTER — Other Ambulatory Visit: Payer: Self-pay | Admitting: Infectious Diseases

## 2018-09-03 DIAGNOSIS — F1190 Opioid use, unspecified, uncomplicated: Secondary | ICD-10-CM

## 2018-09-03 DIAGNOSIS — F1199 Opioid use, unspecified with unspecified opioid-induced disorder: Secondary | ICD-10-CM

## 2018-09-03 MED ORDER — BUPRENORPHINE HCL-NALOXONE HCL 8-2 MG SL FILM: 2.0000 | ORAL_FILM | Freq: Every day | SUBLINGUAL | 0 refills | Status: AC

## 2018-09-03 NOTE — Telephone Encounter (Signed)
Patient given OUD appt for 09/10/2018 at 0915 per Dr. Criselda Peaches. He is transferring care from Dr. Ninetta Lights. Steven Feil, RN, BSN

## 2018-09-03 NOTE — Telephone Encounter (Signed)
Thank you I refilled his rx today, for 30 days.

## 2018-09-03 NOTE — Telephone Encounter (Signed)
Thank you :)

## 2018-09-03 NOTE — Progress Notes (Signed)
Maintenance Visit documentation  .[email or web address]    09/03/2018  Steven [name] presents for follow up of opioid use disorder I have reviewed the prior induction visit, follow up visits, and telephone encounters relevant to opiate use disorder (OUD) treatment.   Current daily dose: 16-4 daily  Date of Induction: 02-20-18  Current follow up interval, in weeks: 4  The patient has been adherent with the buprenorphine for OUD contract.   Last UDS Result: 08-01-18  HPI: 52 yo M with HIV+ and opiod use d/o. He feels he has been doing well. He has had no n/v, diarrhea, constipation. No headaches. He continues to [facility].  He denies cravings.  He asks for refill of his tessalon pearls.   Exam:   There were no vitals filed for this visit.  He is in usual state of appearance. He does not appear to be intoxicated or in active w/d.   Assessment/Plan:  See Problem Based Charting in the Encounters Tab  The NCCRS [[email or web address] was reviewed at the time of visit: I am the only prescriber listed.  A UDS is sent today.  I let him know that we will no longer be prescribing suboxone for him at [facility] and that I had discussed his case with the Internal Medicine clinic.  I have placed a consult for this.    Ginnie Smart, MD  09/03/2018  10:02 AM

## 2018-09-03 NOTE — Progress Notes (Signed)
Please contact this patient to transfer care to [facility] for OUD clinic.  Thanks!

## 2018-09-09 LAB — PAIN MGMT, PROFILE 8 W/CONF, U
6 ACETYLMORPHINE: NEGATIVE ng/mL (ref <10)
ALCOHOL METABOLITES: POSITIVE ng/mL — AB (ref <500)
ALPHAHYDROXYALPRAZOLAM: NEGATIVE ng/mL (ref <25)
ALPHAHYDROXYMIDAZOLAM: NEGATIVE ng/mL (ref <50)
ALPHAHYDROXYTRIAZOLAM: NEGATIVE ng/mL (ref <50)
AMPHETAMINE: 7345 ng/mL — AB (ref <250)
Aminoclonazepam: 180 ng/mL — ABNORMAL HIGH (ref <25)
Amphetamines: POSITIVE ng/mL — AB (ref <500)
BUPRENORPHINE: 15.9 ng/mL — AB (ref <2)
Benzodiazepines: POSITIVE ng/mL — AB (ref <100)
Buprenorphine, Urine: POSITIVE ng/mL — AB (ref <5)
COCAINE METABOLITE: NEGATIVE ng/mL (ref <150)
Creatinine: 75.5 mg/dL
Ethyl Glucuronide (ETG): 6132 ng/mL — ABNORMAL HIGH (ref <500)
Ethyl Sulfate (ETS): 2083 ng/mL — ABNORMAL HIGH (ref <100)
Hydroxyethylflurazepam: NEGATIVE ng/mL (ref <50)
LORAZEPAM: NEGATIVE ng/mL (ref <50)
MARIJUANA METABOLITE: POSITIVE ng/mL — AB (ref <20)
MDMA: NEGATIVE ng/mL (ref <500)
Marijuana Metabolite: 197 ng/mL — ABNORMAL HIGH (ref <5)
Methamphetamine: >40000 ng/mL — ABNORMAL HIGH (ref <250)
NORBUPRENORPHINE: 44.4 ng/mL — AB (ref <2)
Nordiazepam: NEGATIVE ng/mL (ref <50)
OXAZEPAM: NEGATIVE ng/mL (ref <50)
Opiates: NEGATIVE ng/mL (ref <100)
Oxidant: NEGATIVE ug/mL (ref <200)
Oxycodone: NEGATIVE ng/mL (ref <100)
Temazepam: NEGATIVE ng/mL (ref <50)
pH: 6.52 (ref 4.5–9.0)

## 2018-09-10 ENCOUNTER — Telehealth: Payer: Self-pay | Admitting: *Deleted

## 2018-09-10 NOTE — Telephone Encounter (Signed)
Attempted to speak w/ pt this am and ask him to come to OUD this am, lm for rtc

## 2018-10-07 ENCOUNTER — Telehealth: Payer: Self-pay | Admitting: *Deleted

## 2018-10-07 NOTE — Telephone Encounter (Signed)
Unable to reach by ph, will continue to call

## 2018-10-08 ENCOUNTER — Other Ambulatory Visit: Payer: Self-pay | Admitting: *Deleted

## 2018-10-08 ENCOUNTER — Telehealth: Payer: Self-pay | Admitting: *Deleted

## 2018-10-08 MED ORDER — BUPRENORPHINE HCL-NALOXONE HCL 8-2 MG SL FILM: 1.0000 | ORAL_FILM | Freq: Two times a day (BID) | SUBLINGUAL | 0 refills | Status: DC

## 2018-10-08 NOTE — Telephone Encounter (Signed)
Pt needs suboxone refilled, 8.2 strips, 2 daily, states she has no cravings at this time. He states he has not had any relapses for appr 7 months, dr hatcher increased med at that time. He states he is doing well at this time. Ready to move in a new home with sig other. He is out of meds needs soon

## 2018-10-08 NOTE — Telephone Encounter (Signed)
Steven Jacobson, do you know what pharmacy is preferred?

## 2018-10-08 NOTE — Telephone Encounter (Signed)
Have spoken to pt, he has his med

## 2018-10-08 NOTE — Telephone Encounter (Signed)
OUD appt this morning cancelled due to physician illness. [facility]

## 2018-10-08 NOTE — Telephone Encounter (Signed)
[facility]

## 2018-10-08 NOTE — Telephone Encounter (Signed)
Pt is returning a phone call in reference to his suboxone medication refill.

## 2018-10-15 ENCOUNTER — Encounter: Payer: Self-pay | Admitting: Internal Medicine

## 2018-10-15 ENCOUNTER — Ambulatory Visit (INDEPENDENT_AMBULATORY_CARE_PROVIDER_SITE_OTHER): Payer: Medicaid Other | Admitting: Infectious Diseases

## 2018-10-15 ENCOUNTER — Other Ambulatory Visit: Payer: Self-pay

## 2018-10-15 ENCOUNTER — Ambulatory Visit (INDEPENDENT_AMBULATORY_CARE_PROVIDER_SITE_OTHER): Payer: Medicaid Other | Admitting: Internal Medicine

## 2018-10-15 ENCOUNTER — Encounter: Payer: Self-pay | Admitting: Infectious Diseases

## 2018-10-15 VITALS — BP 116/79 | HR 76 | Temp 98.1°F | Wt 166.8 lb

## 2018-10-15 VITALS — BP 148/96 | HR 81 | Temp 98.3°F | Ht 71.0 in | Wt 162.6 lb

## 2018-10-15 DIAGNOSIS — M25551 Pain in right hip: Secondary | ICD-10-CM

## 2018-10-15 DIAGNOSIS — F1199 Opioid use, unspecified with unspecified opioid-induced disorder: Secondary | ICD-10-CM

## 2018-10-15 DIAGNOSIS — Z79899 Other long term (current) drug therapy: Secondary | ICD-10-CM

## 2018-10-15 DIAGNOSIS — Z113 Encounter for screening for infections with a predominantly sexual mode of transmission: Secondary | ICD-10-CM

## 2018-10-15 DIAGNOSIS — I10 Essential (primary) hypertension: Secondary | ICD-10-CM

## 2018-10-15 DIAGNOSIS — M25552 Pain in left hip: Secondary | ICD-10-CM | POA: Diagnosis not present

## 2018-10-15 DIAGNOSIS — B182 Chronic viral hepatitis C: Secondary | ICD-10-CM | POA: Diagnosis not present

## 2018-10-15 DIAGNOSIS — K089 Disorder of teeth and supporting structures, unspecified: Secondary | ICD-10-CM | POA: Diagnosis not present

## 2018-10-15 DIAGNOSIS — Z720 Tobacco use: Secondary | ICD-10-CM

## 2018-10-15 DIAGNOSIS — Z23 Encounter for immunization: Secondary | ICD-10-CM

## 2018-10-15 DIAGNOSIS — M25561 Pain in right knee: Secondary | ICD-10-CM

## 2018-10-15 DIAGNOSIS — B20 Human immunodeficiency virus [HIV] disease: Secondary | ICD-10-CM

## 2018-10-15 DIAGNOSIS — R1111 Vomiting without nausea: Secondary | ICD-10-CM

## 2018-10-15 DIAGNOSIS — M25562 Pain in left knee: Secondary | ICD-10-CM

## 2018-10-15 DIAGNOSIS — K7460 Unspecified cirrhosis of liver: Secondary | ICD-10-CM

## 2018-10-15 DIAGNOSIS — Z21 Asymptomatic human immunodeficiency virus [HIV] infection status: Secondary | ICD-10-CM

## 2018-10-15 DIAGNOSIS — F1190 Opioid use, unspecified, uncomplicated: Secondary | ICD-10-CM

## 2018-10-15 DIAGNOSIS — G8929 Other chronic pain: Secondary | ICD-10-CM | POA: Diagnosis not present

## 2018-10-15 DIAGNOSIS — N401 Enlarged prostate with lower urinary tract symptoms: Secondary | ICD-10-CM

## 2018-10-15 DIAGNOSIS — R3912 Poor urinary stream: Secondary | ICD-10-CM

## 2018-10-15 DIAGNOSIS — F320 Major depressive disorder, single episode, mild: Secondary | ICD-10-CM | POA: Diagnosis not present

## 2018-10-15 DIAGNOSIS — J441 Chronic obstructive pulmonary disease with (acute) exacerbation: Secondary | ICD-10-CM | POA: Diagnosis not present

## 2018-10-15 DIAGNOSIS — F1120 Opioid dependence, uncomplicated: Secondary | ICD-10-CM

## 2018-10-15 MED ORDER — CHLORHEXIDINE GLUCONATE 0.12 % MT SOLN: OROMUCOSAL | 1 refills | Status: DC

## 2018-10-15 MED ORDER — DOLUTEGRAVIR SODIUM 50 MG PO TABS: 50.0000 mg | ORAL_TABLET | Freq: Every day | ORAL | 5 refills | Status: DC

## 2018-10-15 MED ORDER — GABAPENTIN 300 MG PO CAPS: 300.0000 mg | ORAL_CAPSULE | Freq: Three times a day (TID) | ORAL | 0 refills | Status: DC

## 2018-10-15 MED ORDER — DRONABINOL 2.5 MG PO CAPS: ORAL_CAPSULE | ORAL | 4 refills | Status: DC

## 2018-10-15 MED ORDER — NICOTINE 21 MG/24HR TD PT24: 21.0000 mg | MEDICATED_PATCH | Freq: Every day | TRANSDERMAL | 1 refills | Status: DC

## 2018-10-15 MED ORDER — BUPRENORPHINE HCL-NALOXONE HCL 8-2 MG SL FILM: 1.0000 | ORAL_FILM | Freq: Two times a day (BID) | SUBLINGUAL | 0 refills | Status: DC

## 2018-10-15 MED ORDER — DICLOFENAC SODIUM 1 % TD GEL: 2.0000 g | Freq: Four times a day (QID) | TRANSDERMAL | 0 refills | Status: AC

## 2018-10-15 MED ORDER — DULOXETINE HCL 60 MG PO CPEP: 60.0000 mg | ORAL_CAPSULE | Freq: Every day | ORAL | 3 refills | Status: DC

## 2018-10-15 MED ORDER — EMTRICITABINE-TENOFOVIR AF 200-25 MG PO TABS: 1.0000 | ORAL_TABLET | Freq: Every day | ORAL | 5 refills | Status: DC

## 2018-10-15 MED ORDER — MOMETASONE FURO-FORMOTEROL FUM 100-5 MCG/ACT IN AERO: 2.0000 | INHALATION_SPRAY | Freq: Two times a day (BID) | RESPIRATORY_TRACT | 5 refills | Status: DC

## 2018-10-15 MED ORDER — MELOXICAM 7.5 MG PO TABS: 7.5000 mg | ORAL_TABLET | Freq: Every day | ORAL | 0 refills | Status: DC

## 2018-10-15 MED ORDER — TRAZODONE HCL 100 MG PO TABS: 100.0000 mg | ORAL_TABLET | Freq: Every day | ORAL | 3 refills | Status: DC

## 2018-10-15 MED ORDER — ALBUTEROL SULFATE HFA 108 (90 BASE) MCG/ACT IN AERS: 1.0000 | INHALATION_SPRAY | Freq: Four times a day (QID) | RESPIRATORY_TRACT | 2 refills | Status: DC | PRN

## 2018-10-15 MED ORDER — CYCLOBENZAPRINE HCL 10 MG PO TABS: 10.0000 mg | ORAL_TABLET | Freq: Three times a day (TID) | ORAL | 3 refills | Status: DC | PRN

## 2018-10-15 MED ORDER — DARUNAVIR-COBICISTAT 800-150 MG PO TABS: 1.0000 | ORAL_TABLET | Freq: Every day | ORAL | 3 refills | Status: DC

## 2018-10-15 MED ORDER — BENZONATATE 100 MG PO CAPS: ORAL_CAPSULE | ORAL | 0 refills | Status: DC

## 2018-10-15 MED ORDER — PROMETHAZINE HCL 25 MG RE SUPP: 25.0000 mg | Freq: Four times a day (QID) | RECTAL | 0 refills | Status: DC | PRN

## 2018-10-15 MED ORDER — TERAZOSIN HCL 1 MG PO CAPS: 1.0000 mg | ORAL_CAPSULE | Freq: Every day | ORAL | 3 refills | Status: DC

## 2018-10-15 MED ORDER — LISINOPRIL 20 MG PO TABS: 20.0000 mg | ORAL_TABLET | Freq: Every day | ORAL | 3 refills | Status: DC

## 2018-10-15 NOTE — Assessment & Plan Note (Signed)
He needs repeat u/s.

## 2018-10-15 NOTE — Assessment & Plan Note (Signed)
He appears to be doing well His meds are refilled.  Offered/refused condoms.  Flu shot today.  Has gotten pcv rtc in 6 months

## 2018-10-15 NOTE — Assessment & Plan Note (Signed)
Greatly appreciate Dr Criselda Peaches and [facility] for taking on Mr Stender.  Appreciate [facility] f/u as well.

## 2018-10-15 NOTE — Addendum Note (Signed)
Addended by: Roniya Tetro C on: 10/15/2018 01:52 PM   Modules accepted: Orders

## 2018-10-15 NOTE — Progress Notes (Signed)
   10/15/2018  Steven [name] presents for follow up of opioid use disorder I have reviewed the prior induction visit, follow up visits, and telephone encounters relevant to opiate use disorder (OUD) treatment.   Current daily dose:  16-4mg  of buprenorphine daily  Date of Induction: 02/20/18  Current follow up interval, in weeks: 4  The patient has been adherent with the buprenorphine for OUD contract.   Last UDS Result: + for buprenorphine, also amphetamine - he is on trazodone which may explain  HPI: Steven [name] is a 52yo man with OUD.  He has used heroin off and on since his 6920s.  He has had 2 OD events.  He has gone through a treatment program before and he is doing well on buprenorphine, which has been started by his ID provider Dr. Ninetta LightsHatcher.  He has treated HIV and appears to have been treated for HCV.  He is currently doing well without withdrawal or cravings at 16mg  of buprenorphine daily.  He reports having a supportive social system and not spending any time in the "dope hole" or with people who use dope.  He has not used any heroin since starting the buprenorphine.  Dr. Ninetta LightsHatcher is no longer able to provide suboxone for him, and has asked us to take over this prescription.    He notes that he is having problems with urinary flow and thinks his BPH medication might not be working well.  He has also noticed he has higher blood pressure than previous and he would like to talk to Dr. Ninetta LightsHatcher about both of these things, which I encouraged him to do.   Exam:   Vitals:   10/15/18 0955  BP: (!) 148/96  Pulse: 81  Temp: 98.3 F (36.8 C)  TempSrc: Oral  SpO2: 100%  Weight: 162 lb 9.6 oz (73.8 kg)  Height: 5\' 11"  (1.803 m)    General: Awake, alert, well dressed Eyes: Anicteric sclerae Pulm: Breathing comfortably Skin: Dry skin, som cracks in skin of hands Psych: Very talkative, speech not pressured, but storytelling is tangential, very pleasant.   Assessment/Plan:  See  Problem Based Charting in the Encounters Tab     Inez CatalinaMullen, Brandie Lopes B, MD  10/15/2018  10:09 AM

## 2018-10-15 NOTE — Assessment & Plan Note (Signed)
Moderately elevated today Will continue his lisinopril.

## 2018-10-15 NOTE — Assessment & Plan Note (Signed)
Doing well on 16mg  of buprenophine daily.  He is on films.  He has no complaints and reports no cravings or withdrawal.  He would like to change to our clinic for OUD treatment which I think is reasonable.  He is stabilized on his current dose.  He follows with ID for his HIV which will continue.   Plan Refill Buprenorphine-naloxone 16-4mg  daily for 4 week supply U tox today [place] CSRS is reviewed and is appropriate Return in 4 weeks.

## 2018-10-15 NOTE — Addendum Note (Signed)
Addended by: Mariea Clonts D on: 10/15/2018 02:25 PM   Modules accepted: Orders

## 2018-10-15 NOTE — Patient Instructions (Signed)
Steven [name] - -  It was a pleasure to meet you!  I am so glad to hear you are doing well.  Please continue your buprenorphine as prescribed.  Please call us if you need anything!  Come back in 4 weeks.

## 2018-10-15 NOTE — Progress Notes (Signed)
   Subjective:    Patient ID: Steven [name], male    DOB: 04/12/1966, 52 y.o.   MRN: [id number]  HPI 52 yo M with hx of opioid use d/o, HIV+.  He was seen at [facility] by Dr Criselda PeachesMullen and liked the clinic.  He has a problem with his PCP (unpaid bill) and would like multiple meds refilled today.  Would like food help today.  Denies cravings, withdrawal. Back and hip pain improved.  [facility], [facility].    HIV 1 RNA Quant (copies/mL)  Date Value  01/09/2018 <20 NOT DETECTED  05/08/2017 46 (H)  01/08/2017 <20 DETECTED (A)   CD4 T Cell Abs (/uL)  Date Value  01/09/2018 190 (L)  05/08/2017 220 (L)  01/08/2017 220 (L)    Review of Systems  Constitutional: Negative for appetite change and unexpected weight change.  Gastrointestinal: Negative for constipation and diarrhea.  Genitourinary: Positive for decreased urine volume. Negative for difficulty urinating.  Neurological: Positive for light-headedness. Negative for headaches.  straining to pee.  Lightheadedness which he attributes to his BP.  Please see HPI. All other systems reviewed and negative.     Objective:   Physical Exam  Constitutional: He appears well-developed and well-nourished.  HENT:  Mouth/Throat: No oropharyngeal exudate.  Eyes: Pupils are equal, round, and reactive to light. EOM are normal.  Neck: Normal range of motion. Neck supple.  Cardiovascular: Normal rate, regular rhythm and normal heart sounds.  Pulmonary/Chest: Effort normal and breath sounds normal.  Abdominal: Soft. Bowel sounds are normal. He exhibits no distension. There is no tenderness.  Musculoskeletal: Normal range of motion. He exhibits no edema.  Lymphadenopathy:    He has no cervical adenopathy.      Assessment & Plan:

## 2018-10-15 NOTE — Assessment & Plan Note (Signed)
Elevated today.  He plans to discuss with Dr. Ninetta Lights and possibly have his lisinopril increased.  Agree with f/u with Dr. Ninetta Lights.

## 2018-10-15 NOTE — Assessment & Plan Note (Addendum)
Encouraged to quit, will be difficult with his psych overlay.  Nicotine patches refilled.

## 2018-10-16 LAB — T-HELPER CELL (CD4) - (RCID CLINIC ONLY)
CD4 % Helper T Cell: 14 % — ABNORMAL LOW (ref 33–55)
CD4 T Cell Abs: 190 /uL — ABNORMAL LOW (ref 400–2700)

## 2018-10-17 ENCOUNTER — Other Ambulatory Visit: Payer: Self-pay | Admitting: Infectious Diseases

## 2018-10-17 DIAGNOSIS — B182 Chronic viral hepatitis C: Secondary | ICD-10-CM

## 2018-10-19 LAB — CBC
HCT: 41.8 % (ref 38.5–50.0)
Hemoglobin: 14.6 g/dL (ref 13.2–17.1)
MCH: 31.7 pg (ref 27.0–33.0)
MCHC: 34.9 g/dL (ref 32.0–36.0)
MCV: 90.9 fL (ref 80.0–100.0)
MPV: 10.1 fL (ref 7.5–12.5)
Platelets: 226 10*3/uL (ref 140–400)
RBC: 4.6 10*6/uL (ref 4.20–5.80)
RDW: 12.2 % (ref 11.0–15.0)
WBC: 6.7 10*3/uL (ref 3.8–10.8)

## 2018-10-19 LAB — HEPATITIS C RNA QUANTITATIVE
HCV Quantitative Log: <1.18 {Log_IU}/mL
HCV RNA, PCR, QN: <15 [IU]/mL

## 2018-10-19 LAB — COMPREHENSIVE METABOLIC PANEL WITH GFR
AG Ratio: 1.4 (calc) (ref 1.0–2.5)
ALT: 14 U/L (ref 9–46)
AST: 16 U/L (ref 10–35)
Albumin: 4.2 g/dL (ref 3.6–5.1)
Alkaline phosphatase (APISO): 58 U/L (ref 40–115)
BUN: 12 mg/dL (ref 7–25)
CO2: 28 mmol/L (ref 20–32)
Calcium: 9.5 mg/dL (ref 8.6–10.3)
Chloride: 98 mmol/L (ref 98–110)
Creat: 0.91 mg/dL (ref 0.70–1.33)
Globulin: 3 g/dL (ref 1.9–3.7)
Glucose, Bld: 87 mg/dL (ref 65–99)
Potassium: 4.3 mmol/L (ref 3.5–5.3)
Sodium: 133 mmol/L — ABNORMAL LOW (ref 135–146)
Total Bilirubin: 0.5 mg/dL (ref 0.2–1.2)
Total Protein: 7.2 g/dL (ref 6.1–8.1)

## 2018-10-19 LAB — LIPID PANEL
Cholesterol: 153 mg/dL
HDL: 54 mg/dL
LDL Cholesterol (Calc): 82 mg/dL
Non-HDL Cholesterol (Calc): 99 mg/dL
Total CHOL/HDL Ratio: 2.8 (calc)
Triglycerides: 91 mg/dL

## 2018-10-19 LAB — HIV-1 RNA ULTRAQUANT REFLEX TO GENTYP+
HIV 1 RNA Quant: <20 {copies}/mL
HIV-1 RNA Quant, Log: <1.3 {Log_copies}/mL

## 2018-10-19 LAB — SYPHILIS: RPR W/REFLEX TO RPR TITER AND TREPONEMAL ANTIBODIES, TRADITIONAL SCREENING AND DIAGNOSIS ALGORITHM: RPR Ser Ql: REACTIVE — AB

## 2018-10-19 LAB — FLUORESCENT TREPONEMAL AB(FTA)-IGG-BLD: FLUORESCENT TREPONEMAL ABS: REACTIVE — AB

## 2018-10-19 LAB — RPR TITER: RPR Titer: 1:2 {titer} — ABNORMAL HIGH

## 2018-10-21 LAB — TOXASSURE SELECT,+ANTIDEPR,UR

## 2018-10-23 ENCOUNTER — Other Ambulatory Visit: Payer: Self-pay

## 2018-10-25 ENCOUNTER — Other Ambulatory Visit: Payer: Self-pay | Admitting: Infectious Diseases

## 2018-10-25 DIAGNOSIS — B182 Chronic viral hepatitis C: Secondary | ICD-10-CM

## 2018-10-30 ENCOUNTER — Other Ambulatory Visit: Payer: Self-pay

## 2018-11-04 ENCOUNTER — Other Ambulatory Visit: Payer: Self-pay

## 2018-11-12 ENCOUNTER — Ambulatory Visit (INDEPENDENT_AMBULATORY_CARE_PROVIDER_SITE_OTHER): Payer: Medicaid Other | Admitting: Internal Medicine

## 2018-11-12 ENCOUNTER — Other Ambulatory Visit: Payer: Self-pay

## 2018-11-12 VITALS — BP 137/91 | HR 84 | Temp 98.0°F | Ht 71.0 in | Wt 165.1 lb

## 2018-11-12 DIAGNOSIS — F1190 Opioid use, unspecified, uncomplicated: Secondary | ICD-10-CM

## 2018-11-12 DIAGNOSIS — F1120 Opioid dependence, uncomplicated: Secondary | ICD-10-CM

## 2018-11-12 DIAGNOSIS — F1199 Opioid use, unspecified with unspecified opioid-induced disorder: Secondary | ICD-10-CM

## 2018-11-12 DIAGNOSIS — Z21 Asymptomatic human immunodeficiency virus [HIV] infection status: Secondary | ICD-10-CM

## 2018-11-12 MED ORDER — BUPRENORPHINE HCL-NALOXONE HCL 8-2 MG SL FILM: 2.0000 | ORAL_FILM | Freq: Every day | SUBLINGUAL | 0 refills | Status: DC

## 2018-11-12 NOTE — Progress Notes (Signed)
   11/12/2018  Steven [name] presents for follow up of opioid use disorder I have reviewed the prior induction visit, follow up visits, and telephone encounters relevant to opiate use disorder (OUD) treatment.   Current daily dose:  16-4mg  of buprenorphine daily  Date of Induction: 02/20/18  Current follow up interval, in weeks: 4  The patient has been adherent with the buprenorphine for OUD contract.   Last UDS Result: + for buprenorphine, also amphetamine - he is on trazodone which may explain  HPI: [name] is a 52yo man with OUD.  He has used heroin off and on since his 10820s.  He has had 2 OD events.  He has gone through a treatment program before and he is doing well on buprenorphine, which has been started by his ID provider Dr. Ninetta LightsHatcher.  He has treated HIV and appears to have been treated for HCV.  He is currently doing well without withdrawal or cravings at 16mg  of buprenorphine daily.  He was late to his appointment today, he reports this was due to [place] a [place] in [place] and not being able to make it back to [place] in time.  His phone is broken and could not call.  He denies any other issues.    Exam:   Vitals:   11/12/18 1327  BP: (!) 137/91  Pulse: 84  Temp: 98 F (36.7 [name])  TempSrc: Oral  SpO2: 100%  Weight: 165 lb 1.6 oz (74.9 kg)  Height: 5\' 11"  (1.803 m)    General: Awake, alert, well dressed Eyes: Anicteric sclerae Pulm: Breathing comfortably Psych: Very talkative, friendly  Assessment/Plan:  See Problem Based Charting in the Encounters Tab     Gust RungHoffman, Steven Dulany C, DO  11/12/2018  2:34 PM

## 2018-11-12 NOTE — Assessment & Plan Note (Signed)
Appears to be doing well on suboxone, reminded needs to be on time for appointments or call ahead if he is going to miss. Continue Suboxone 16-4mg  daily (films), sent Rx to pharmacy follow up in 4 weeks.

## 2018-11-13 ENCOUNTER — Other Ambulatory Visit: Payer: Self-pay

## 2018-12-01 ENCOUNTER — Other Ambulatory Visit: Payer: Self-pay | Admitting: Infectious Diseases

## 2018-12-01 DIAGNOSIS — Z720 Tobacco use: Secondary | ICD-10-CM

## 2018-12-03 ENCOUNTER — Telehealth: Payer: Self-pay | Admitting: Internal Medicine

## 2018-12-03 NOTE — Telephone Encounter (Signed)
Received page for patient call. Attempted number attached to page but person who answered did not know [name] or page the [facility]. Attempted to call x2 to the phone number listed in the chart, voicemail not set up.

## 2018-12-05 ENCOUNTER — Telehealth: Payer: Self-pay | Admitting: Internal Medicine

## 2018-12-05 ENCOUNTER — Telehealth: Payer: Self-pay | Admitting: *Deleted

## 2018-12-05 ENCOUNTER — Telehealth: Payer: Self-pay

## 2018-12-05 MED ORDER — BUPRENORPHINE HCL-NALOXONE HCL 8-2 MG SL FILM: 2.0000 | ORAL_FILM | Freq: Every day | SUBLINGUAL | 0 refills | Status: DC

## 2018-12-05 NOTE — Telephone Encounter (Signed)
Received call from Steven [name] stating that he is not able to pick up his suboxone that was called in for him earlier today. Spoke with pharmacist who stated that insurance is not authorizing him to get his medication. He has moved from [place] without bringing his medication and he does not have access to the script.   Pharmacist provided patient with one pill tonight. I infomred Steven [name] to call [facility] tomorrow 1/3 to figure out how we can get him the rest of his supply.   Lorenso Courier, MD Internal Medicine PGY2 Pager:[phone] 12/05/2018, 10:31 PM

## 2018-12-05 NOTE — Telephone Encounter (Signed)
Pt had moved to [place] with a friend, pt had problems there and left without his med, he will not have access to his belongings for at least a week, ID has called his HIV meds but he needs at least a week of suboxone. Please advise, he is going into withdrawal and is in triage office

## 2018-12-05 NOTE — Telephone Encounter (Signed)
Helen - -  Again, can't send in Rx electronically from home.  Can you call in an Rx for him for 1 week supply.  He needs to follow up as planned on 12/10/18.    Thanks!  Debe Coder, MD

## 2018-12-05 NOTE — Telephone Encounter (Signed)
Spoke w/ dr Criselda Peaches, [facility], informed pt

## 2018-12-05 NOTE — Telephone Encounter (Signed)
Received a call from Mr. Servellon via the answering service. Attempted to call back [phone] which was the wrong number. I also called [phone] which was on file and was not able to reach him.   Attempted to call 3 times without success.   Lorenso Courier, MD Internal Medicine PGY2 Pager:[phone] 12/05/2018, 8:13 PM

## 2018-12-05 NOTE — Telephone Encounter (Signed)
Needs to speak with a nurse about meds, please call pt back.

## 2018-12-05 NOTE — Telephone Encounter (Signed)
Called to pharm, informed pt, reminded him of appt

## 2018-12-06 ENCOUNTER — Telehealth: Payer: Self-pay | Admitting: *Deleted

## 2018-12-06 NOTE — Telephone Encounter (Signed)
Called pharm this am spoke w/ ben the pharmacist, pt stayed at store all night, he rec'd all his meds this am and has now left the store.

## 2018-12-06 NOTE — Telephone Encounter (Signed)
Pt has gotten his meds and left the store he spent the night in the store

## 2018-12-10 ENCOUNTER — Other Ambulatory Visit: Payer: Self-pay

## 2018-12-10 ENCOUNTER — Ambulatory Visit (INDEPENDENT_AMBULATORY_CARE_PROVIDER_SITE_OTHER): Payer: Medicaid Other | Admitting: Student in an Organized Health Care Education/Training Program

## 2018-12-10 VITALS — BP 139/84 | HR 80 | Temp 98.0°F | Ht 71.0 in | Wt 171.7 lb

## 2018-12-10 DIAGNOSIS — F1120 Opioid dependence, uncomplicated: Secondary | ICD-10-CM

## 2018-12-10 DIAGNOSIS — Z21 Asymptomatic human immunodeficiency virus [HIV] infection status: Secondary | ICD-10-CM

## 2018-12-10 DIAGNOSIS — F1190 Opioid use, unspecified, uncomplicated: Secondary | ICD-10-CM

## 2018-12-10 DIAGNOSIS — F1199 Opioid use, unspecified with unspecified opioid-induced disorder: Secondary | ICD-10-CM

## 2018-12-10 MED ORDER — BUPRENORPHINE HCL-NALOXONE HCL 8-2 MG SL FILM: 2.0000 | ORAL_FILM | Freq: Every day | SUBLINGUAL | 0 refills | Status: DC

## 2018-12-10 NOTE — Progress Notes (Signed)
   12/10/2018  Steven [name] presents for follow up of opioid use disorder I have reviewed the prior induction visit, follow up visits, and telephone encounters relevant to opiate use disorder (OUD) treatment.   Current daily dose: Suboxone 16 mg daily  Date of Induction: 02/20/18  Current follow up interval, in weeks: 4  The patient has been adherent with the buprenorphine for OUD contract.   Last UDS Result: Buprenorphine presents along with polysubstances  HPI: 53 year old person living with HIV here for follow-up of his use disorder.  Tells me a long story today, the gist of which is that he was arrested in late December because of a warrant for shoplifting in 2016.  He was in jail for 3 days, then released for time served.  During that.  They did supply him with Suboxone while in jail.  There was some confusion after he left jail because his usual supply of medication was left behind in [place] and he was transported to [place].  Through several telephone calls we were finally able to get him some Suboxone over the holiday break soon enough to just avoid withdrawals.  He denies any relapses despite all this extra stress.   Socially he continues to be unhoused but uses the homeless shelters occasionally as needed.  Exam:   Vitals:   12/10/18 1132  BP: 139/84  Pulse: 80  Temp: 98 F (36.7 C)  TempSrc: Oral  SpO2: 100%  Weight: 171 lb 11.2 oz (77.9 kg)  Height: 5\' 11"  (1.803 m)    General: Well-appearing man, no distress Neuro: Alert, oriented, conversational, normal gait, normal strength upper and lower extremities Extremities: Warm and well-perfused with no edema Psych: Not depressed or anxious appearing, a little high strung, fast speech but not exactly pressured, good linear thoughts, just very detail oriented and telling his stories.   Assessment/Plan:  See Problem Based Charting in the Encounters Tab    Tyson Alias, MD  12/10/2018  11:57 AM

## 2018-12-10 NOTE — Assessment & Plan Note (Signed)
Opioid use disorder seems to be well controlled.  Reports no relapses recently.  Will check my substance test today.  I reviewed the controlled database which was appropriate.  Despite spending 3 days in jail last month he was still able to stay on his medication and overcome the obstacles which is a good sign.  Plan is to continue with Suboxone 2 films daily, as prescribed a 1 month supply.  Follow-up with ODD clinic in 4 weeks for recheck.

## 2018-12-16 ENCOUNTER — Other Ambulatory Visit: Payer: Self-pay

## 2018-12-18 LAB — TOXASSURE SELECT,+ANTIDEPR,UR

## 2019-01-06 ENCOUNTER — Telehealth: Payer: Self-pay

## 2019-01-06 NOTE — Telephone Encounter (Signed)
Requesting to speak with Steven [name]. Please call pt back.  

## 2019-01-06 NOTE — Telephone Encounter (Signed)
Attempted to return call, no answer, states vmail has not been setup.

## 2019-01-07 NOTE — Telephone Encounter (Signed)
rec'd message vmail not setup

## 2019-01-16 ENCOUNTER — Ambulatory Visit
Admission: RE | Admit: 2019-01-16 | Discharge: 2019-01-16 | Disposition: A | Discharge status: Home / Self Care | Payer: Medicaid Other | Source: Ambulatory Visit | Attending: Infectious Diseases | Admitting: Infectious Diseases

## 2019-01-16 ENCOUNTER — Telehealth: Payer: Self-pay | Admitting: Behavioral Health

## 2019-01-16 DIAGNOSIS — B182 Chronic viral hepatitis C: Secondary | ICD-10-CM

## 2019-01-16 NOTE — Telephone Encounter (Signed)
Patient is requesting a refill for [employer].  Will send to Dr. Ninetta Lights for clarification.  Patient uses the [facility] on [address]. Angeline Slim RN

## 2019-01-17 ENCOUNTER — Other Ambulatory Visit: Payer: Self-pay | Admitting: Behavioral Health

## 2019-01-17 DIAGNOSIS — Z720 Tobacco use: Secondary | ICD-10-CM

## 2019-01-17 MED ORDER — BENZONATATE 100 MG PO CAPS: ORAL_CAPSULE | ORAL | 0 refills | Status: DC

## 2019-01-17 NOTE — Telephone Encounter (Signed)
Ok to refill   thanks

## 2019-01-28 ENCOUNTER — Ambulatory Visit (INDEPENDENT_AMBULATORY_CARE_PROVIDER_SITE_OTHER): Payer: Medicaid Other | Admitting: Internal Medicine

## 2019-01-28 VITALS — BP 115/82 | HR 102 | Temp 98.4°F | Wt 168.6 lb

## 2019-01-28 DIAGNOSIS — F1190 Opioid use, unspecified, uncomplicated: Secondary | ICD-10-CM

## 2019-01-28 DIAGNOSIS — F1199 Opioid use, unspecified with unspecified opioid-induced disorder: Secondary | ICD-10-CM

## 2019-01-28 DIAGNOSIS — F1120 Opioid dependence, uncomplicated: Secondary | ICD-10-CM | POA: Diagnosis present

## 2019-01-28 DIAGNOSIS — R451 Restlessness and agitation: Secondary | ICD-10-CM

## 2019-01-28 MED ORDER — BUPRENORPHINE HCL-NALOXONE HCL 8-2 MG SL FILM: 2.0000 | ORAL_FILM | Freq: Every day | SUBLINGUAL | 0 refills | Status: DC

## 2019-01-28 NOTE — Progress Notes (Signed)
   01/28/2019  Steven [name] presents for follow up of opioid use disorder I have reviewed the prior induction visit, follow up visits, and telephone encounters relevant to opiate use disorder (OUD) treatment.   Current daily dose: Buprenorphine-naloxone 8-2mg  film BID  Date of Induction: 02/20/18  Current follow up interval, in weeks: 4 weeks  The patient has been adherent with the buprenorphine for OUD contract.   Last UDS Result: appropriate, other substances are prescribed except oxepam.   HPI: Steven [name] is a 53 year old man who presents for treatment of OUD with buprenorphine.  Mr. Chou reports that he is doing well.  He has had enough medication so he felt he did not have to come to his appointment last week.  He has had a lot of turmoil in his home life the last month including a stretch of being unhomed, living half the time in [place].  He is now back to [place] full time and renting a room in a trailer.  He feels safe there.  He is applying to the [employer] for permanent housing.  He reports no opiate use, no heroin use.  He is only taking his prescribed medications.    Exam:   Vitals:   01/28/19 0946  BP: 115/82  Pulse: (!) 102  Temp: 98.4 F (36.9 C)  TempSrc: Oral  SpO2: 98%  Weight: 168 lb 9.6 oz (76.5 kg)    General: Well appearing, nad Eyes: Anicteric, no injection Pulm: Breathing comfortably, no wheezing Psych: + psychomotor agitation, chronic for him.  Normal mood.   Assessment/Plan:  See Problem Based Charting in the Encounters Tab     Inez Catalina, MD  01/28/2019  9:58 AM

## 2019-01-28 NOTE — Assessment & Plan Note (Signed)
He reports doing well.  He is back in a stable living situation and hoping to progress to a more permanent situation. He has not gone off of his medication b/c he reported having enough to get him to this appointment.  He continues to follow up with Dr. Ninetta Lights for HIV treatment.  He has no complaints today.  He reports no illicit substances.   Plan Continue buprenorphine-naloxone 8-2mg  BID U tox today PDMP reviewed and appropriate Follow up in 4 weeks.

## 2019-01-28 NOTE — Patient Instructions (Signed)
Mr. Caselli - -  It was very good to see you today.   Continue your suboxone as you have been taking it.   Come back to see [place] in 4 weeks.

## 2019-02-01 LAB — TOXASSURE SELECT,+ANTIDEPR,UR

## 2019-02-25 ENCOUNTER — Telehealth (INDEPENDENT_AMBULATORY_CARE_PROVIDER_SITE_OTHER): Payer: Medicaid Other | Admitting: Internal Medicine

## 2019-02-25 ENCOUNTER — Telehealth: Payer: Self-pay

## 2019-02-25 ENCOUNTER — Other Ambulatory Visit: Payer: Self-pay

## 2019-02-25 DIAGNOSIS — F1190 Opioid use, unspecified, uncomplicated: Secondary | ICD-10-CM

## 2019-02-25 DIAGNOSIS — F1199 Opioid use, unspecified with unspecified opioid-induced disorder: Secondary | ICD-10-CM

## 2019-02-25 MED ORDER — BUPRENORPHINE HCL-NALOXONE HCL 8-2 MG SL FILM: 2.0000 | ORAL_FILM | Freq: Every day | SUBLINGUAL | 0 refills | Status: DC

## 2019-02-25 NOTE — Progress Notes (Signed)
    02/25/2019  This is a telephone encounter between Steven [name] and Steven [name] on 02/25/2019 for OUD. The visit was conducted with the patient located at home and Steven [name] at [facility]. The patient's identity was confirmed using their DOB and current address. The patient has consented to being evaluated through a telephone encounter and understands the associated risks/benefits. I personally spent 6 on medical discussion.    Current daily dose: Buprenorphine-naloxone 8-2mg  film BID  Date of Induction: 02/20/18  Current follow up interval, in weeks: 4 weeks  The patient has been adherent with the buprenorphine for OUD contract.   Last UDS Result: appropriate, other substances are prescribed except oxepam.   HPI: [name] is a 53 year old man who presents for treatment of OUD with buprenorphine.  [name] that he is doing well.  He has had enough medication so he felt he did not have to come to his appointment last week.  He has had a lot of turmoil in his home life the last month including a stretch of being unhomed, living half the time in [place].  He is now back to [employer] full time and renting a room in a trailer.  He feels safe there.  He is applying to the [employer] for permanent housing.  He reports no opiate use, no heroin use.  He is only taking his prescribed medications.    Exam:  Gen: calm, oriented, appropriate  Assessment/Plan:   Refill Suboxone 8-2mg , take 2 film daily.  Follow up in 1 month.     Steven Rung, DO  02/25/2019  3:59 PM

## 2019-02-25 NOTE — Telephone Encounter (Signed)
Buprenorphine HCl-Naloxone HCl 8-2 MG FILM   Refill request @  [id number] - [place], [place] - [address] AT [facility] OF [address] & [address] [phone] (Phone) [phone] (Fax)

## 2019-03-12 ENCOUNTER — Telehealth: Payer: Self-pay | Admitting: Internal Medicine

## 2019-03-12 NOTE — Telephone Encounter (Signed)
Entered in error

## 2019-03-21 ENCOUNTER — Emergency Department (HOSPITAL_COMMUNITY): Payer: Medicaid Other

## 2019-03-21 ENCOUNTER — Encounter (HOSPITAL_COMMUNITY): Payer: Self-pay | Admitting: Emergency Medicine

## 2019-03-21 ENCOUNTER — Emergency Department (HOSPITAL_COMMUNITY)
Admission: EM | Admit: 2019-03-21 | Discharge: 2019-03-21 | Disposition: A | Discharge status: Home / Self Care | Payer: Medicaid Other | Attending: Emergency Medicine | Admitting: Emergency Medicine

## 2019-03-21 ENCOUNTER — Other Ambulatory Visit: Payer: Self-pay

## 2019-03-21 DIAGNOSIS — F1910 Other psychoactive substance abuse, uncomplicated: Secondary | ICD-10-CM | POA: Insufficient documentation

## 2019-03-21 DIAGNOSIS — Z8709 Personal history of other diseases of the respiratory system: Secondary | ICD-10-CM | POA: Diagnosis not present

## 2019-03-21 DIAGNOSIS — B20 Human immunodeficiency virus [HIV] disease: Secondary | ICD-10-CM | POA: Insufficient documentation

## 2019-03-21 DIAGNOSIS — Z79899 Other long term (current) drug therapy: Secondary | ICD-10-CM | POA: Diagnosis not present

## 2019-03-21 DIAGNOSIS — R05 Cough: Secondary | ICD-10-CM | POA: Diagnosis not present

## 2019-03-21 DIAGNOSIS — F17210 Nicotine dependence, cigarettes, uncomplicated: Secondary | ICD-10-CM | POA: Diagnosis not present

## 2019-03-21 DIAGNOSIS — X31XXXA Exposure to excessive natural cold, initial encounter: Secondary | ICD-10-CM | POA: Insufficient documentation

## 2019-03-21 DIAGNOSIS — B182 Chronic viral hepatitis C: Secondary | ICD-10-CM | POA: Diagnosis not present

## 2019-03-21 DIAGNOSIS — T68XXXA Hypothermia, initial encounter: Secondary | ICD-10-CM | POA: Diagnosis not present

## 2019-03-21 DIAGNOSIS — R404 Transient alteration of awareness: Secondary | ICD-10-CM | POA: Insufficient documentation

## 2019-03-21 LAB — CBC WITH DIFFERENTIAL/PLATELET
Abs Immature Granulocytes: 0.03 10*3/uL (ref 0.00–0.07)
Basophils Absolute: 0 10*3/uL (ref 0.0–0.1)
Basophils Relative: 0 %
Eosinophils Absolute: 0.1 10*3/uL (ref 0.0–0.5)
Eosinophils Relative: 1 %
HCT: 40.6 % (ref 39.0–52.0)
Hemoglobin: 13.4 g/dL (ref 13.0–17.0)
Immature Granulocytes: 0 %
Lymphocytes Relative: 16 %
Lymphs Abs: 1.4 10*3/uL (ref 0.7–4.0)
MCH: 29.3 pg (ref 26.0–34.0)
MCHC: 33 g/dL (ref 30.0–36.0)
MCV: 88.8 fL (ref 80.0–100.0)
Monocytes Absolute: 1.1 10*3/uL — ABNORMAL HIGH (ref 0.1–1.0)
Monocytes Relative: 12 %
Neutro Abs: 6.3 10*3/uL (ref 1.7–7.7)
Neutrophils Relative %: 71 %
Platelets: 232 10*3/uL (ref 150–400)
RBC: 4.57 MIL/uL (ref 4.22–5.81)
RDW: 12.6 % (ref 11.5–15.5)
WBC: 8.9 10*3/uL (ref 4.0–10.5)
nRBC: 0 % (ref 0.0–0.2)

## 2019-03-21 LAB — RAPID URINE DRUG SCREEN, HOSP PERFORMED
Amphetamines: POSITIVE — AB
Barbiturates: NOT DETECTED
Benzodiazepines: POSITIVE — AB
Cocaine: POSITIVE — AB
Opiates: POSITIVE — AB
Tetrahydrocannabinol: POSITIVE — AB

## 2019-03-21 LAB — COMPREHENSIVE METABOLIC PANEL
ALT: 24 U/L (ref 0–44)
AST: 35 U/L (ref 15–41)
Albumin: 4 g/dL (ref 3.5–5.0)
Alkaline Phosphatase: 68 U/L (ref 38–126)
Anion gap: 14 (ref 5–15)
BUN: 17 mg/dL (ref 6–20)
CO2: 20 mmol/L — ABNORMAL LOW (ref 22–32)
Calcium: 9.6 mg/dL (ref 8.9–10.3)
Chloride: 100 mmol/L (ref 98–111)
Creatinine, Ser: 0.8 mg/dL (ref 0.61–1.24)
GFR calc Af Amer: >60 mL/min (ref >60)
GFR calc non Af Amer: >60 mL/min (ref >60)
Glucose, Bld: 108 mg/dL — ABNORMAL HIGH (ref 70–99)
Potassium: 4 mmol/L (ref 3.5–5.1)
Sodium: 134 mmol/L — ABNORMAL LOW (ref 135–145)
Total Bilirubin: 0.8 mg/dL (ref 0.3–1.2)
Total Protein: 7.6 g/dL (ref 6.5–8.1)

## 2019-03-21 LAB — ETHANOL: Alcohol, Ethyl (B): <10 mg/dL (ref <10)

## 2019-03-21 NOTE — ED Triage Notes (Signed)
Pt in via [facility] after being found down, passed out on the sidewalk. Per EMS, pt only alert to self and has L side "lean" present. Hx of CVA, pt endorses ETOH use last night. c-collar on

## 2019-03-21 NOTE — ED Provider Notes (Signed)
[facility] EMERGENCY DEPARTMENT Provider Note   CSN: [id number] Arrival date & time: 03/21/19  0825    History   Chief Complaint Chief Complaint  Patient presents with   Altered Mental Status    HPI Steven [name] is a 53 y.o. male.     HPI Patient found down on the sidewalk and EMS was called.  Noted to be severely intoxicated and admits to EtOH use last night.  Down for unknown period.  Patient is unable to contribute to history.  Level 5 caveat applies.  Cervical collar placed prior to arrival. Past Medical History:  Diagnosis Date   AIDS (HCC) 1985    Asthma    Cerebral hemorrhage (HCC) 2000   Hemophilia (HCC)    Hepatitis C    never treated   HIV (human immunodeficiency virus infection) (HCC)     Patient Active Problem List   Diagnosis Date Noted   Weight loss 05/15/2018   Tobacco abuse 02/26/2018   Opioid use disorder (HCC) 01/23/2018   HTN (hypertension) 06/20/2017   Condyloma acuminatum of perianal region 01/08/2017   Homeless 01/08/2017   Urinary hesitancy 01/08/2017   COPD exacerbation (HCC) 01/12/2016   Depression 08/04/2015   Hepatitis C 08/02/2015   HIV disease (HCC) 07/28/2015   Other pancytopenia (HCC) 07/28/2015   Esophageal tear    Gastrointestinal hemorrhage with melena    GI bleed 07/26/2015   Numbness on left side 07/15/2013    Past Surgical History:  Procedure Laterality Date   ESOPHAGOGASTRODUODENOSCOPY (EGD) WITH PROPOFOL N/A 07/27/2015   Procedure: ESOPHAGOGASTRODUODENOSCOPY (EGD) WITH PROPOFOL;  Surgeon: Hilarie Fredrickson, MD;  Location: [facility];  Service: Endoscopy;  Laterality: N/A;   HERNIA REPAIR     laporotomy     MANDIBLE SURGERY          Home Medications    Prior to Admission medications   Medication Sig Start Date End Date Taking? Authorizing Provider  albuterol (PROVENTIL HFA;VENTOLIN HFA) 108 (90 Base) MCG/ACT inhaler Inhale 1-2 puffs into the lungs every 6 (six)  hours as needed for wheezing or shortness of breath. 10/15/18   Ginnie Smart, MD  benzonatate (TESSALON) 100 MG capsule TAKE ONE CAPSULE BY MOUTH THREE TIMES DAILY AS NEEDED FOR COUGH 01/17/19   Ginnie Smart, MD  Buprenorphine HCl-Naloxone HCl 8-2 MG FILM Place 2 Film under the tongue daily. 02/25/19   Gust Rung, DO  chlorhexidine (PERIDEX) 0.12 % solution SWISH AND SPIT 10 ML IN THE MOUTH OR THROAT FOUR TIMES DAILY AS DIRECTED 10/15/18   Ginnie Smart, MD  cyclobenzaprine (FLEXERIL) 10 MG tablet Take 1 tablet (10 mg total) by mouth 3 (three) times daily as needed for muscle spasms. 10/15/18   Ginnie Smart, MD  darunavir-cobicistat (PREZCOBIX) 800-150 MG tablet Take 1 tablet by mouth daily with breakfast. 10/15/18   Ginnie Smart, MD  diclofenac sodium (VOLTAREN) 1 % GEL Apply 2 g topically 4 (four) times daily. 10/15/18   Ginnie Smart, MD  diphenhydrAMINE (BENADRYL) 25 mg capsule Take 25 mg by mouth every 6 (six) hours as needed for allergies.    [provider]  dolutegravir (TIVICAY) 50 MG tablet Take 1 tablet (50 mg total) by mouth daily. 10/15/18   Ginnie Smart, MD  dronabinol (MARINOL) 2.5 MG capsule TAKE ONE CAPSULE BY MOUTH TWICE DAILY BEFORE A MEAL 10/15/18   Ginnie Smart, MD  DULoxetine (CYMBALTA) 60 MG capsule Take 1 capsule (60 mg total) by  mouth daily. 10/15/18   Ginnie SmartHatcher, Jeffrey C, MD  emtricitabine-tenofovir AF (DESCOVY) 200-25 MG tablet Take 1 tablet by mouth daily. 10/15/18   Ginnie SmartHatcher, Jeffrey C, MD  ENSURE (ENSURE) Take 237 mLs by mouth 2 (two) times daily between meals. Do not take with medications. 06/11/18   Ginnie SmartHatcher, Jeffrey C, MD  gabapentin (NEURONTIN) 300 MG capsule Take 1 capsule (300 mg total) by mouth 3 (three) times daily. 10/15/18   Ginnie SmartHatcher, Jeffrey C, MD  hydroxypropyl methylcellulose (ISOPTO TEARS) 2.5 % ophthalmic solution Place 1 drop into both eyes 3 (three) times daily as needed for dry eyes.    [provider]  lisinopril (PRINIVIL,ZESTRIL) 20 MG tablet Take 1 tablet (20 mg total) by mouth daily. 10/15/18   Ginnie SmartHatcher, Jeffrey C, MD  meloxicam (MOBIC) 7.5 MG tablet Take 1 tablet (7.5 mg total) by mouth daily. 10/15/18   Ginnie SmartHatcher, Jeffrey C, MD  mometasone-formoterol (DULERA) 100-5 MCG/ACT AERO Inhale 2 puffs into the lungs 2 (two) times daily. 10/15/18   Ginnie SmartHatcher, Jeffrey C, MD  Multiple Vitamin (MULTIVITAMIN WITH MINERALS) TABS tablet Take 1 tablet by mouth daily.    [provider]  nicotine (NICODERM CQ) 21 mg/24hr patch Place 1 patch (21 mg total) onto the skin daily. 10/15/18   Ginnie SmartHatcher, Jeffrey C, MD  promethazine (PHENERGAN) 25 MG suppository Place 1 suppository (25 mg total) rectally every 6 (six) hours as needed for nausea or vomiting. 10/15/18   Ginnie SmartHatcher, Jeffrey C, MD  terazosin (HYTRIN) 1 MG capsule Take 1 capsule (1 mg total) by mouth at bedtime. 10/15/18   Ginnie SmartHatcher, Jeffrey C, MD  traZODone (DESYREL) 100 MG tablet Take 1 tablet (100 mg total) by mouth at bedtime. 10/15/18   Ginnie SmartHatcher, Jeffrey C, MD    Family History Family History  Problem Relation Age of Onset   CAD Mother    Stroke Mother     Social History Social History   Tobacco Use   Smoking status: Current Every Day Smoker    Packs/day: 2.00    Years: 38.00    Pack years: 76.00    Types: Cigarettes   Smokeless tobacco: Never Used   Tobacco comment: 1.5 PPD  Substance Use Topics   Alcohol use: Yes    Alcohol/week: 0.0 standard drinks    Comment: 6 pack per week per patient    Drug use: No     Allergies   Aspirin   Review of Systems Review of Systems  Unable to perform ROS: Mental status change     Physical Exam Updated Vital Signs BP (!) 146/98    Pulse 90    Temp 98.1 F (36.7 C) (Oral)    Resp 17    Wt 76.5 kg    SpO2 96%    BMI 23.52 kg/m   Physical Exam Vitals signs and nursing note reviewed.  Constitutional:      Appearance: He is well-developed.     Comments: Mildly  agitated.  Slurring words.  HENT:     Head: Normocephalic and atraumatic.     Comments: No obvious head trauma.  No intraoral trauma.    Mouth/Throat:     Mouth: Mucous membranes are moist.  Eyes:     Extraocular Movements: Extraocular movements intact.     Pupils: Pupils are equal, round, and reactive to light.  Neck:     Comments: Cervical collar in place. Cardiovascular:     Rate and Rhythm: Normal rate and regular rhythm.     Heart sounds: No  murmur. No friction rub. No gallop.   Pulmonary:     Effort: Pulmonary effort is normal. No respiratory distress.     Breath sounds: Normal breath sounds. No stridor. No wheezing, rhonchi or rales.     Comments: Patient is coughing repeatedly while in room. Chest:     Chest wall: No tenderness.  Abdominal:     General: Bowel sounds are normal.     Palpations: Abdomen is soft.     Tenderness: There is no abdominal tenderness. There is no guarding or rebound.  Musculoskeletal: Normal range of motion.        General: No swelling, tenderness, deformity or signs of injury.     Right lower leg: No edema.     Left lower leg: No edema.  Skin:    General: Skin is dry.     Findings: No erythema or rash.     Comments: Extremities are cool.  Neurological:     Comments: Intermittently following commands.  Appears to move all extremities without focal deficit.      ED Treatments / Results  Labs (all labs ordered are listed, but only abnormal results are displayed) Labs Reviewed  CBC WITH DIFFERENTIAL/PLATELET - Abnormal; Notable for the following components:      Result Value   Monocytes Absolute 1.1 (*)    All other components within normal limits  COMPREHENSIVE METABOLIC PANEL - Abnormal; Notable for the following components:   Sodium 134 (*)    CO2 20 (*)    Glucose, Bld 108 (*)    All other components within normal limits  ETHANOL  RAPID URINE DRUG SCREEN, HOSP PERFORMED    EKG None  Radiology Ct Head Wo Contrast  Result  Date: 03/21/2019 CLINICAL DATA:  53 year old male with a fall EXAM: CT HEAD WITHOUT CONTRAST CT CERVICAL SPINE WITHOUT CONTRAST TECHNIQUE: Multidetector CT imaging of the head and cervical spine was performed following the standard protocol without intravenous contrast. Multiplanar CT image reconstructions of the cervical spine were also generated. COMPARISON:  None. FINDINGS: CT HEAD FINDINGS Brain: No acute intracranial hemorrhage. No midline shift or mass effect. Gray-white differentiation maintained. Unremarkable appearance of the ventricular system. Patchy hypodensities in the bilateral periventricular white matter. Vascular: Unremarkable. Skull: No acute fracture.  No aggressive bone lesion identified. Sinuses/Orbits: Unremarkable appearance of the orbits. Mastoid air cells clear. No middle ear effusion. No significant sinus disease. Other: None CT CERVICAL SPINE FINDINGS Alignment: Anterolisthesis of C3 on C4 measures 4 mm-5 mm. Trace anterolisthesis of C4 on C5. Facets are aligned. Craniocervical junction aligned. Skull base and vertebrae: No acute fracture at the skullbase. Vertebral body heights relatively maintained. No acute fracture identified. Soft tissues and spinal canal: Unremarkable cervical soft tissues. Calcifications of the carotid vasculature. Disc levels: Disc space narrowing with endplate sclerosis of C3-C4 with anterolisthesis. Bilateral uncovertebral joint disease. Mild right foraminal narrowing. Mild anterolisthesis of C4 on C5. Bilateral uncovertebral joint disease with left greater than right facet disease contributes to left greater than right foraminal narrowing. Endplate sclerosis, disc narrowing, posterior disc osteophyte complex and uncovertebral joint disease at C5-C6. Bilateral foraminal narrowing. Advanced disc disease with vacuum disc phenomenon and sclerotic changes at C6-C7. No bony canal narrowing or significant foraminal narrowing. Relatively unremarkable C7-T1. Upper chest:  Unremarkable appearance of the lung apices. Other: No bony canal narrowing. IMPRESSION: Head CT: No acute intracranial abnormality. Evidence of chronic microvascular ischemic disease. Cervical CT: No acute fracture or malalignment of the cervical spine. Multilevel degenerative changes, worst at  C6-C7 and C5-C6. 4 mm-5 mm anterolisthesis of C3-C4 appears degenerative. Electronically Signed   By: Gilmer Mor D.O.   On: 03/21/2019 09:22   Ct Cervical Spine Wo Contrast  Result Date: 03/21/2019 CLINICAL DATA:  53 year old male with a fall EXAM: CT HEAD WITHOUT CONTRAST CT CERVICAL SPINE WITHOUT CONTRAST TECHNIQUE: Multidetector CT imaging of the head and cervical spine was performed following the standard protocol without intravenous contrast. Multiplanar CT image reconstructions of the cervical spine were also generated. COMPARISON:  None. FINDINGS: CT HEAD FINDINGS Brain: No acute intracranial hemorrhage. No midline shift or mass effect. Gray-white differentiation maintained. Unremarkable appearance of the ventricular system. Patchy hypodensities in the bilateral periventricular white matter. Vascular: Unremarkable. Skull: No acute fracture.  No aggressive bone lesion identified. Sinuses/Orbits: Unremarkable appearance of the orbits. Mastoid air cells clear. No middle ear effusion. No significant sinus disease. Other: None CT CERVICAL SPINE FINDINGS Alignment: Anterolisthesis of C3 on C4 measures 4 mm-5 mm. Trace anterolisthesis of C4 on C5. Facets are aligned. Craniocervical junction aligned. Skull base and vertebrae: No acute fracture at the skullbase. Vertebral body heights relatively maintained. No acute fracture identified. Soft tissues and spinal canal: Unremarkable cervical soft tissues. Calcifications of the carotid vasculature. Disc levels: Disc space narrowing with endplate sclerosis of C3-C4 with anterolisthesis. Bilateral uncovertebral joint disease. Mild right foraminal narrowing. Mild  anterolisthesis of C4 on C5. Bilateral uncovertebral joint disease with left greater than right facet disease contributes to left greater than right foraminal narrowing. Endplate sclerosis, disc narrowing, posterior disc osteophyte complex and uncovertebral joint disease at C5-C6. Bilateral foraminal narrowing. Advanced disc disease with vacuum disc phenomenon and sclerotic changes at C6-C7. No bony canal narrowing or significant foraminal narrowing. Relatively unremarkable C7-T1. Upper chest: Unremarkable appearance of the lung apices. Other: No bony canal narrowing. IMPRESSION: Head CT: No acute intracranial abnormality. Evidence of chronic microvascular ischemic disease. Cervical CT: No acute fracture or malalignment of the cervical spine. Multilevel degenerative changes, worst at C6-C7 and C5-C6. 4 mm-5 mm anterolisthesis of C3-C4 appears degenerative. Electronically Signed   By: Gilmer Mor D.O.   On: 03/21/2019 09:22   Dg Chest Port 1 View  Result Date: 03/21/2019 CLINICAL DATA:  Cough EXAM: PORTABLE CHEST 1 VIEW COMPARISON:  Chest radiograph November 23, 2017 and chest CT November 23, 2017 FINDINGS: There is no edema or consolidation. The heart size and pulmonary vascularity are normal. No adenopathy. There is aortic atherosclerosis. No bone lesions evident. IMPRESSION: No edema or consolidation. Stable cardiac silhouette. Aortic Atherosclerosis (ICD10-I70.0). Electronically Signed   By: Bretta Bang III M.D.   On: 03/21/2019 09:05    Procedures Procedures (including critical care time)  Medications Ordered in ED Medications - No data to display   Initial Impression / Assessment and Plan / ED Course  I have reviewed the triage vital signs and the nursing notes.  Pertinent labs & imaging results that were available during my care of the patient were reviewed by me and considered in my medical decision making (see chart for details).       Patient is now awake and alert.  Initiated  rewarming and temperature is now 98.1.  CT head without acute findings.  Alcohol level is normal but has not provided urine for urine drug screen.  We will discharge home to follow-up with his primary physician.  Return precautions given. Final Clinical Impressions(s) / ED Diagnoses   Final diagnoses:  Transient alteration of awareness  Hypothermia, initial encounter    ED Discharge  Orders    None       Loren Racer, MD 03/21/19 1315

## 2019-03-21 NOTE — ED Notes (Signed)
Patient transported to CT 

## 2019-03-21 NOTE — ED Notes (Signed)
Returned from ct scan 

## 2019-03-28 ENCOUNTER — Other Ambulatory Visit: Payer: Self-pay | Admitting: *Deleted

## 2019-03-28 NOTE — Telephone Encounter (Signed)
Attempted to call but no answer, would like to get him set up for a televisit before this refill. (he had recent ED visit 1 week ago for AMS with a urine drug screen that was positive for multiple agents, need to inquire about this before refill is given.)

## 2019-03-29 NOTE — Telephone Encounter (Signed)
Attempted to call x2 no answer, message states voicemail not set up., Steven [name] could you attempt to reach out next week.

## 2019-03-29 NOTE — Telephone Encounter (Signed)
   Reason for call:   I received a call from [name] at 3:00 PM indicating that he needed his suboxone refilled.   Pertinent Data:   Stated that he ran out of his medication one day ago  Does not have any nausea, vomiting, abdominal pain, diarrhea  Dr. Mikey [name] attempted to call him yesterday, but was not able to be reached   UDS from ED visit on 03/21/19 was positive for opiates, cocaine, benzodiazepines, amphetamines, THC   Assessment / Plan / Recommendations:   Informed Dr. Mikey [name] of patient's call and his request for suboxone refill. Dr. Mikey [name] mentioned that he will call him back.  As always, pt is advised that if symptoms worsen or new symptoms arise, they should go to an urgent care facility or to to ER for further evaluation.   Steven Courier, MD   03/29/2019, 3:26 PM

## 2019-03-31 NOTE — Telephone Encounter (Signed)
I agree with Dr. Mikey Bussing, will need discussion prior to refill.  Attempted to call, no answer, no voicemail.   To explain his UDS - he is on dronabinol.  He is on an opiate.  He is on trazodone which can explain the methamphetamine apparently.  However, the cocaine is unexpected.  Will need further discussion.   Debe Coder, MD

## 2019-04-01 MED ORDER — BUPRENORPHINE HCL-NALOXONE HCL 8-2 MG SL FILM: 2.0000 | ORAL_FILM | Freq: Every day | SUBLINGUAL | 0 refills | Status: DC

## 2019-04-01 NOTE — Telephone Encounter (Signed)
Patient did not answer call at specified number.    Will need to speak with him if he is to get further refills.   I will send in a 1 week supply of his medication.  Next time he calls, please set him up with a virtual visit with me (anytime is fine this week) or Dr. Mikey Bussing next week.  We need to get in touch with him prior to further refills.    Thank you!

## 2019-04-01 NOTE — Telephone Encounter (Signed)
Pt has called 3 times, sent dr Mikey Bussing a text message, pt seems on edge

## 2019-04-02 ENCOUNTER — Telehealth: Payer: Self-pay | Admitting: *Deleted

## 2019-04-02 NOTE — Telephone Encounter (Signed)
charsetta put him on the template for tomorrow 0915 BUT he is expecting the call between 1300-1500, he states he will have his phone ready for that 2 hr window tomorrow pm 1300-1500, charsetta said just let you know.

## 2019-04-02 NOTE — Telephone Encounter (Signed)
I moved patient to my list and will call him tomorrow!  Thank you!

## 2019-04-03 ENCOUNTER — Other Ambulatory Visit: Payer: Self-pay

## 2019-04-03 ENCOUNTER — Encounter: Payer: Medicaid Other | Admitting: Internal Medicine

## 2019-04-03 ENCOUNTER — Telehealth: Payer: Self-pay | Admitting: Internal Medicine

## 2019-04-03 NOTE — Telephone Encounter (Signed)
Attempted to call X 4 between 2:15 and 3pm.    No answer, no voicemail.    Debe Coder, MD

## 2019-04-11 ENCOUNTER — Other Ambulatory Visit: Payer: Self-pay

## 2019-04-11 NOTE — Telephone Encounter (Signed)
Attempted to call patient.  No answer.  "call could not be completed."  No refills until he is able to speak to a provider.    Debe Coder, MD

## 2019-04-11 NOTE — Telephone Encounter (Signed)
Buprenorphine HCl-Naloxone HCl 8-2 MG FILM, REFILL REQUEST @  [id number] - [place], [place] - [address] AT [facility] OF [address] & [address] [phone] (Phone) [phone] (Fax)

## 2019-04-15 ENCOUNTER — Ambulatory Visit: Payer: Medicaid Other | Admitting: Infectious Diseases

## 2019-04-17 NOTE — Telephone Encounter (Signed)
Attempted to call pt, could not be completed

## 2019-04-26 ENCOUNTER — Telehealth: Payer: Self-pay | Admitting: Internal Medicine

## 2019-04-26 MED ORDER — BUPRENORPHINE HCL-NALOXONE HCL 8-2 MG SL FILM: 2.0000 | ORAL_FILM | Freq: Every day | SUBLINGUAL | 0 refills | Status: DC

## 2019-04-26 NOTE — Telephone Encounter (Addendum)
Mr. defrees calls me from his friend's phone.  He tells me he needs some help, he has not used any illicit drugs but he is out of Suboxone and is asking if he can have a refill today.  He tells me that he has had trouble having access to a phone and that he was evicted from his apartment.  He also says that he was told that he would not be prescribed a repeat prescription without talking to the provider who is prescribing it.  He asked that I relay this message to Dr. Criselda Peaches.  I agreed to relay the message.  Spoke with Dr. Criselda Peaches about patient at 10:00 am.

## 2019-04-26 NOTE — Telephone Encounter (Signed)
Discussed Mr. Wajda with Dr. Frances Furbish, on call resident.   Mr. Dolch called the on call Resident today requesting a refill of Suboxone.  He reports difficulty with having adequate access to a phone.  I believe he is essentially homeless.  Last refill was in April, and he would have been out of medication for about 3 weeks.  He reported to Dr. Frances Furbish that he had not been using any substances, but feels more stable and able to control cravings on suboxone (per our last discussion).    I think, based on the principle of risk reduction, I will refill his medication today.  He seems to have a less stable living situation right now, compared to when he was receiving his buprenorphine from Dr. Ninetta Lights.  Based on my previous conversations with Dr. Ninetta Lights, Beverely Pace was doing very well on buprenorphine and he had a low suspicion for diversion.    Will send in a 2 week supply of medication and hopefully have our nurses get in touch with him next week for an in  Person appointment.    I attempted to call him at given number, [phone].  No answer, voicemail did not identify number.    Plan 2 week supply of suboxone to preferred pharmacy Plan for visit in 1-2 weeks if possible PDMP reviewed and he last picked up medication on 4/28, 7 day supply  Debe Coder, MD

## 2019-04-30 ENCOUNTER — Ambulatory Visit (HOSPITAL_COMMUNITY)
Admission: EM | Admit: 2019-04-30 | Discharge: 2019-04-30 | Disposition: A | Discharge status: Home / Self Care | Payer: Medicaid Other | Attending: Internal Medicine | Admitting: Internal Medicine

## 2019-04-30 ENCOUNTER — Encounter (HOSPITAL_COMMUNITY): Payer: Self-pay | Admitting: Emergency Medicine

## 2019-04-30 ENCOUNTER — Telehealth: Payer: Self-pay | Admitting: Internal Medicine

## 2019-04-30 ENCOUNTER — Other Ambulatory Visit: Payer: Self-pay

## 2019-04-30 DIAGNOSIS — K047 Periapical abscess without sinus: Secondary | ICD-10-CM | POA: Diagnosis not present

## 2019-04-30 DIAGNOSIS — L02411 Cutaneous abscess of right axilla: Secondary | ICD-10-CM | POA: Diagnosis not present

## 2019-04-30 DIAGNOSIS — H5789 Other specified disorders of eye and adnexa: Secondary | ICD-10-CM | POA: Diagnosis not present

## 2019-04-30 DIAGNOSIS — K029 Dental caries, unspecified: Secondary | ICD-10-CM | POA: Diagnosis not present

## 2019-04-30 MED ORDER — AMOXICILLIN 500 MG PO CAPS: 500.0000 mg | ORAL_CAPSULE | Freq: Three times a day (TID) | ORAL | 0 refills | Status: DC

## 2019-04-30 MED ORDER — LIDOCAINE VISCOUS HCL 2 % MT SOLN: 15.0000 mL | OROMUCOSAL | 0 refills | Status: DC | PRN

## 2019-04-30 MED ORDER — LIDOCAINE HCL (PF) 1 % IJ SOLN
INTRAMUSCULAR | Status: AC
  Filled 2019-04-30: qty 2

## 2019-04-30 MED ORDER — BUPRENORPHINE HCL-NALOXONE HCL 8-2 MG SL FILM: 2.0000 | ORAL_FILM | Freq: Every day | SUBLINGUAL | 0 refills | Status: DC

## 2019-04-30 NOTE — Discharge Instructions (Addendum)
Take antibiotic daily as written for your dental infection and follow up with your dentist. Keep armpit clean/dry and use hot compress 3 times daily.   Return if redness spreads, it becomes more painful, or you develop fever. Use ice packs daily for relief of eye swelling.  Return if you develop change in vision, eye pain, or increased swelling. Call if you are unable to get your HIV medications filled by Dr. Ninetta Lights later today.

## 2019-04-30 NOTE — Telephone Encounter (Signed)
Patient presented to our office without appointment today, spoke with Steven [name], apparently he has been kicked out of the place he was living. Lost most of belongings and his phone, he still has his suboxone Rx and has 14 pills left (took a few extra doses for increased cravings).  Reports he is going to see Dr Ninetta Lights today and while he is nearby stopped by to let us know about his struggle and loss of phone for new appointment.  He was given appointment in roughly 2.5 weeks.  I will send in extra 2 week refill for next week to get him to that appointment.

## 2019-04-30 NOTE — ED Provider Notes (Signed)
[facility]    CSN: [id number] Arrival date & time: 04/30/19  1040     History   Chief Complaint Chief Complaint  Patient presents with  . Abscess    HPI Steven [name] is a 53 y.o. male past medical history significant for HIV, opioid use disorder, homelessness, COPD presenting for numerous concerns.  Patient notes right axillary abscess.  States he noticed this 5 days ago.  Has not tried anything for this.  Endorses pain, redness; denies fever, other lesions. Patient also notes lower tooth pain.  Patient has poor dentition with several caries and fillings.  Patient states he is on a wait list for dentist affiliated with his primary care, Dr. Mikey Bussing.  States that it is painful when he chews; denies fever, ear pain, odynophagia, history of dental or tonsillar abscess. Patient states that he was bit by a mosquito last night.  Endorses periorbital edema (upper > lower lid) and pruritus.  Denies eye pain, pain with eye movement, change in vision. Of note, patient states he has been out of his HIV antivirals for 5 days.  States he is going to see his PCP, Dr. Ninetta Lights, after leaving here for refills and these can be called in by Dr. Ninetta Lights if needed.  Past Medical History:  Diagnosis Date  . AIDS (HCC) 1985   . Asthma   . Cerebral hemorrhage (HCC) 2000  . Hemophilia (HCC)   . Hepatitis C    never treated  . HIV (human immunodeficiency virus infection) [facility])     Patient Active Problem List   Diagnosis Date Noted  . Weight loss 05/15/2018  . Tobacco abuse 02/26/2018  . Opioid use disorder (HCC) 01/23/2018  . HTN (hypertension) 06/20/2017  . Condyloma acuminatum of perianal region 01/08/2017  . Homeless 01/08/2017  . Urinary hesitancy 01/08/2017  . COPD exacerbation (HCC) 01/12/2016  . Depression 08/04/2015  . Hepatitis C 08/02/2015  . HIV disease (HCC) 07/28/2015  . Other pancytopenia (HCC) 07/28/2015  . Esophageal tear   . Gastrointestinal hemorrhage with  melena   . GI bleed 07/26/2015  . Numbness on left side 07/15/2013    Past Surgical History:  Procedure Laterality Date  . ESOPHAGOGASTRODUODENOSCOPY (EGD) WITH PROPOFOL N/A 07/27/2015   Procedure: ESOPHAGOGASTRODUODENOSCOPY (EGD) WITH PROPOFOL;  Surgeon: Hilarie Fredrickson, MD;  Location: [facility];  Service: Endoscopy;  Laterality: N/A;  . HERNIA REPAIR    . laporotomy    . MANDIBLE SURGERY         Home Medications    Prior to Admission medications   Medication Sig Start Date End Date Taking? Authorizing Provider  darunavir-cobicistat (PREZCOBIX) 800-150 MG tablet Take 1 tablet by mouth daily with breakfast. 10/15/18  Yes Hatcher, Lacretia Leigh, MD  dolutegravir (TIVICAY) 50 MG tablet Take 1 tablet (50 mg total) by mouth daily. 10/15/18  Yes Ginnie Smart, MD  emtricitabine-tenofovir AF (DESCOVY) 200-25 MG tablet Take 1 tablet by mouth daily. 10/15/18  Yes Ginnie Smart, MD  gabapentin (NEURONTIN) 300 MG capsule Take 1 capsule (300 mg total) by mouth 3 (three) times daily. 10/15/18  Yes Ginnie Smart, MD  albuterol (PROVENTIL HFA;VENTOLIN HFA) 108 (90 Base) MCG/ACT inhaler Inhale 1-2 puffs into the lungs every 6 (six) hours as needed for wheezing or shortness of breath. 10/15/18   Ginnie Smart, MD  amoxicillin (AMOXIL) 500 MG capsule Take 1 capsule (500 mg total) by mouth 3 (three) times daily. 04/30/19   Hall-Potvin, [place], PA-C  Buprenorphine HCl-Naloxone HCl  8-2 MG FILM Place 2 Film under the tongue daily for 14 days. 05/07/19 05/21/19  Gust Rung, DO  cyclobenzaprine (FLEXERIL) 10 MG tablet Take 1 tablet (10 mg total) by mouth 3 (three) times daily as needed for muscle spasms. 10/15/18   Ginnie Smart, MD  diclofenac sodium (VOLTAREN) 1 % GEL Apply 2 g topically 4 (four) times daily. 10/15/18   Ginnie Smart, MD  diphenhydrAMINE (BENADRYL) 25 mg capsule Take 25 mg by mouth every 6 (six) hours as needed for allergies.    [provider]   dronabinol (MARINOL) 2.5 MG capsule TAKE ONE CAPSULE BY MOUTH TWICE DAILY BEFORE A MEAL 10/15/18   Ginnie Smart, MD  DULoxetine (CYMBALTA) 60 MG capsule Take 1 capsule (60 mg total) by mouth daily. 10/15/18   Ginnie Smart, MD  ENSURE (ENSURE) Take 237 mLs by mouth 2 (two) times daily between meals. Do not take with medications. 06/11/18   Ginnie Smart, MD  hydroxypropyl methylcellulose (ISOPTO TEARS) 2.5 % ophthalmic solution Place 1 drop into both eyes 3 (three) times daily as needed for dry eyes.    [provider]  lidocaine (XYLOCAINE) 2 % solution Use as directed 15 mLs in the mouth or throat as needed for mouth pain. 04/30/19   Hall-Potvin, [place], PA-C  lisinopril (PRINIVIL,ZESTRIL) 20 MG tablet Take 1 tablet (20 mg total) by mouth daily. 10/15/18   Ginnie Smart, MD  meloxicam (MOBIC) 7.5 MG tablet Take 1 tablet (7.5 mg total) by mouth daily. 10/15/18   Ginnie Smart, MD  mometasone-formoterol (DULERA) 100-5 MCG/ACT AERO Inhale 2 puffs into the lungs 2 (two) times daily. 10/15/18   Ginnie Smart, MD  Multiple Vitamin (MULTIVITAMIN WITH MINERALS) TABS tablet Take 1 tablet by mouth daily.    [provider]  nicotine (NICODERM CQ) 21 mg/24hr patch Place 1 patch (21 mg total) onto the skin daily. 10/15/18   Ginnie Smart, MD  promethazine (PHENERGAN) 25 MG suppository Place 1 suppository (25 mg total) rectally every 6 (six) hours as needed for nausea or vomiting. 10/15/18   Ginnie Smart, MD  terazosin (HYTRIN) 1 MG capsule Take 1 capsule (1 mg total) by mouth at bedtime. 10/15/18   Ginnie Smart, MD  traZODone (DESYREL) 100 MG tablet Take 1 tablet (100 mg total) by mouth at bedtime. 10/15/18   Ginnie Smart, MD    Family History Family History  Problem Relation Age of Onset  . CAD Mother   . Stroke Mother     Social History Social History   Tobacco Use  . Smoking status: Current Every Day Smoker    Packs/day: 2.00     Years: 38.00    Pack years: 76.00    Types: Cigarettes  . Smokeless tobacco: Never Used  . Tobacco comment: 1.5 PPD  Substance Use Topics  . Alcohol use: Yes    Alcohol/week: 0.0 standard drinks    Comment: 6 pack per week per patient   . Drug use: No     Allergies   Aspirin   Review of Systems As per HPI  Physical Exam Triage Vital Signs ED Triage Vitals  Enc Vitals Group     BP 04/30/19 1111 114/71     Pulse Rate 04/30/19 1111 78     Resp 04/30/19 1111 20     Temp 04/30/19 1111 98.7 F (37.1 C)     Temp Source 04/30/19 1111 Oral     SpO2  04/30/19 1111 100 %     Weight --      Height --      Head Circumference --      Peak Flow --      Pain Score 04/30/19 1106 7     Pain Loc --      Pain Edu? --      Excl. in GC? --    No data found.  Updated Vital Signs BP 114/71 (BP Location: Left Arm)   Pulse 78   Temp 98.7 F (37.1 C) (Oral)   Resp 20   SpO2 100%   Visual Acuity Right Eye Distance:   Left Eye Distance:   Bilateral Distance:    Right Eye Near:   Left Eye Near:    Bilateral Near:     Physical Exam Constitutional:      General: He is not in acute distress.    Appearance: He is normal weight.     Comments: Adequate hygiene  HENT:     Head: Normocephalic and atraumatic.     Right Ear: Tympanic membrane and ear canal normal.     Left Ear: Tympanic membrane and ear canal normal.     Nose: Nose normal.     Mouth/Throat:     Mouth: Mucous membranes are moist.     Pharynx: Oropharynx is clear. No oropharyngeal exudate or posterior oropharyngeal erythema.     Comments: Poor dentition with several dental caries and fillings.  Right lower molar tender to palpation.  Underlying erythema, no fluctuancy or warmth appreciated Eyes:     General: No scleral icterus.    Extraocular Movements: Extraocular movements intact.     Conjunctiva/sclera: Conjunctivae normal.     Pupils: Pupils are equal, round, and reactive to light.     Comments: Right upper  lid swollen w/ mild erythema, nontender to palpation.  No fluctuance or ecchymosis noted.  No lesions with version of upper lid.  No foreign body identified.  Neck:     Musculoskeletal: Neck supple. No muscular tenderness.  Cardiovascular:     Rate and Rhythm: Normal rate.  Pulmonary:     Effort: Pulmonary effort is normal.  Lymphadenopathy:     Cervical: No cervical adenopathy.  Skin:    Comments: 3 cm round, fluctuant abscess noted in right axilla.  Exquisitely tender to palpation with underlying erythema and warmth.  Neurological:     Mental Status: He is alert.      UC Treatments / Results  Labs (all labs ordered are listed, but only abnormal results are displayed) Labs Reviewed - No data to display  EKG None  Radiology No results found.  Procedures Incision and Drainage Date/Time: 04/30/2019 1:12 PM Performed by: Shea EvansHall-Potvin, Brittany, PA-C Authorized by: Merrilee JanskyLamptey, Philip O, MD   Consent:    Consent obtained:  Verbal   Consent given by:  Patient   Risks discussed:  Bleeding, incomplete drainage, pain, damage to other organs and infection   Alternatives discussed:  No treatment Universal protocol:    Patient identity confirmed:  Verbally with patient Location:    Type:  Abscess   Size:  3 cm   Location: right axilla. Pre-procedure details:    Skin preparation:  Betadine Anesthesia (see MAR for exact dosages):    Anesthesia method:  Local infiltration   Local anesthetic:  Lidocaine 2% w/o epi Procedure type:    Complexity:  Simple Procedure details:    Needle aspiration: no     Incision types:  Single straight   Incision depth:  Subcutaneous   Scalpel blade:  11   Wound management:  Probed and deloculated   Drainage:  Purulent and bloody   Drainage amount:  Moderate   Wound treatment:  Wound left open Post-procedure details:    Patient tolerance of procedure:  Tolerated with difficulty (second to pain, no immediate complications)   (including critical  care time)  Medications Ordered in UC Medications - No data to display  Initial Impression / Assessment and Plan / UC Course  I have reviewed the triage vital signs and the nursing notes.  Pertinent labs & imaging results that were available during my care of the patient were reviewed by me and considered in my medical decision making (see chart for details).     53 year old male with HIV, opioid use disorder, and homelessness presenting for multiple concerns.  Right upper lid swelling likely benign, second to bug bite.  No obvious lesions, or signs of infection identified.  Patient to take allergy medication as well as apply cool compresses to help reduce swelling.  Right axillary abscess was I&D today, patient tolerated procedure well.  Instructed to continue using hot compresses for additional relief.  Patient also given antibiotics for dental infection, which may help cover abscess.  States that he is going to see his PCP, Dr. Ninetta Lights, after leaving here.  Patient states he has been out of his antivirals for 5 days, can get refills through his PCP.  Return precautions discussed, patient verbalized understanding. Final Clinical Impressions(s) / UC Diagnoses   Final diagnoses:  Abscess of axilla, right  Infected dental caries  Periorbital swelling     Discharge Instructions     Take antibiotic daily as written for your dental infection and follow up with your dentist. Keep armpit clean/dry and use hot compress 3 times daily.   Return if redness spreads, it becomes more painful, or you develop fever. Use ice packs daily for relief of eye swelling.  Return if you develop change in vision, eye pain, or increased swelling. Call if you are unable to get your HIV medications filled by Dr. Ninetta Lights later today.    ED Prescriptions    Medication Sig Dispense Auth. Provider   amoxicillin (AMOXIL) 500 MG capsule Take 1 capsule (500 mg total) by mouth 3 (three) times daily. 21 capsule  Hall-Potvin, [place], PA-C   lidocaine (XYLOCAINE) 2 % solution Use as directed 15 mLs in the mouth or throat as needed for mouth pain. 100 mL Hall-Potvin, [place], PA-C     Controlled Substance Prescriptions [place] Controlled Substance Registry consulted? Not Applicable   Shea Evans, [place] 04/30/19 1316

## 2019-04-30 NOTE — Telephone Encounter (Signed)
Pt called, couldn't give a new ph# he stated, verified his appt, he states he is having a hard time getting his medicine, wanted to get it today, called pharm, earliest day for pick up up will be 6/5, just picked it up 5/23, 14 day supply. I explained by law it can not be filled, he was agreeable, stating he all mixed up.verified his appt again.

## 2019-04-30 NOTE — ED Triage Notes (Signed)
Swelling to right eye, reports mosquito bite.  Noticed swelling today and itches  Patient is homeless  Right axilla abscess.

## 2019-05-04 ENCOUNTER — Encounter (HOSPITAL_COMMUNITY): Payer: Self-pay | Admitting: *Deleted

## 2019-05-04 ENCOUNTER — Emergency Department (HOSPITAL_COMMUNITY)
Admission: EM | Admit: 2019-05-04 | Discharge: 2019-05-04 | Disposition: A | Discharge status: Home / Self Care | Payer: Medicaid Other | Attending: Emergency Medicine | Admitting: Emergency Medicine

## 2019-05-04 ENCOUNTER — Emergency Department (HOSPITAL_COMMUNITY): Payer: Medicaid Other

## 2019-05-04 ENCOUNTER — Other Ambulatory Visit: Payer: Self-pay

## 2019-05-04 DIAGNOSIS — I10 Essential (primary) hypertension: Secondary | ICD-10-CM | POA: Insufficient documentation

## 2019-05-04 DIAGNOSIS — Z79899 Other long term (current) drug therapy: Secondary | ICD-10-CM | POA: Diagnosis not present

## 2019-05-04 DIAGNOSIS — K2970 Gastritis, unspecified, without bleeding: Secondary | ICD-10-CM | POA: Insufficient documentation

## 2019-05-04 DIAGNOSIS — F17210 Nicotine dependence, cigarettes, uncomplicated: Secondary | ICD-10-CM | POA: Diagnosis not present

## 2019-05-04 DIAGNOSIS — J449 Chronic obstructive pulmonary disease, unspecified: Secondary | ICD-10-CM | POA: Insufficient documentation

## 2019-05-04 DIAGNOSIS — Z791 Long term (current) use of non-steroidal anti-inflammatories (NSAID): Secondary | ICD-10-CM | POA: Diagnosis not present

## 2019-05-04 DIAGNOSIS — J45909 Unspecified asthma, uncomplicated: Secondary | ICD-10-CM | POA: Diagnosis not present

## 2019-05-04 DIAGNOSIS — R1011 Right upper quadrant pain: Secondary | ICD-10-CM | POA: Diagnosis present

## 2019-05-04 LAB — CBC
HCT: 38.8 % — ABNORMAL LOW (ref 39.0–52.0)
Hemoglobin: 12.9 g/dL — ABNORMAL LOW (ref 13.0–17.0)
MCH: 30.5 pg (ref 26.0–34.0)
MCHC: 33.2 g/dL (ref 30.0–36.0)
MCV: 91.7 fL (ref 80.0–100.0)
Platelets: 201 10*3/uL (ref 150–400)
RBC: 4.23 MIL/uL (ref 4.22–5.81)
RDW: 12.6 % (ref 11.5–15.5)
WBC: 7 10*3/uL (ref 4.0–10.5)
nRBC: 0 % (ref 0.0–0.2)

## 2019-05-04 LAB — URINALYSIS, ROUTINE W REFLEX MICROSCOPIC
Bilirubin Urine: NEGATIVE
Glucose, UA: NEGATIVE mg/dL
Hgb urine dipstick: NEGATIVE
Ketones, ur: NEGATIVE mg/dL
Leukocytes,Ua: NEGATIVE
Nitrite: NEGATIVE
Protein, ur: NEGATIVE mg/dL
Specific Gravity, Urine: 1.019 (ref 1.005–1.030)
pH: 7 (ref 5.0–8.0)

## 2019-05-04 LAB — COMPREHENSIVE METABOLIC PANEL
ALT: 16 U/L (ref 0–44)
AST: 19 U/L (ref 15–41)
Albumin: 3.7 g/dL (ref 3.5–5.0)
Alkaline Phosphatase: 74 U/L (ref 38–126)
Anion gap: 9 (ref 5–15)
BUN: 8 mg/dL (ref 6–20)
CO2: 29 mmol/L (ref 22–32)
Calcium: 9.7 mg/dL (ref 8.9–10.3)
Chloride: 95 mmol/L — ABNORMAL LOW (ref 98–111)
Creatinine, Ser: 0.85 mg/dL (ref 0.61–1.24)
GFR calc Af Amer: >60 mL/min (ref >60)
GFR calc non Af Amer: >60 mL/min (ref >60)
Glucose, Bld: 106 mg/dL — ABNORMAL HIGH (ref 70–99)
Potassium: 4.4 mmol/L (ref 3.5–5.1)
Sodium: 133 mmol/L — ABNORMAL LOW (ref 135–145)
Total Bilirubin: 0.5 mg/dL (ref 0.3–1.2)
Total Protein: 7.5 g/dL (ref 6.5–8.1)

## 2019-05-04 LAB — LIPASE, BLOOD: Lipase: 20 U/L (ref 11–51)

## 2019-05-04 MED ORDER — LIDOCAINE VISCOUS HCL 2 % MT SOLN
15.0000 mL | Freq: Once | OROMUCOSAL | Status: AC
  Administered 2019-05-04: 15 mL via ORAL
  Filled 2019-05-04: qty 15

## 2019-05-04 MED ORDER — IOHEXOL 300 MG/ML  SOLN
100.0000 mL | Freq: Once | INTRAMUSCULAR | Status: AC | PRN
  Administered 2019-05-04: 07:00:00 100 mL via INTRAVENOUS

## 2019-05-04 MED ORDER — SUCRALFATE 1 GM/10ML PO SUSP: 1.0000 g | Freq: Three times a day (TID) | ORAL | 0 refills | Status: DC

## 2019-05-04 MED ORDER — OMEPRAZOLE 20 MG PO CPDR: 20.0000 mg | DELAYED_RELEASE_CAPSULE | Freq: Every day | ORAL | 0 refills | Status: DC

## 2019-05-04 MED ORDER — TERAZOSIN HCL 1 MG PO CAPS: 1.0000 mg | ORAL_CAPSULE | Freq: Every day | ORAL | 0 refills | Status: DC

## 2019-05-04 MED ORDER — ALUM & MAG HYDROXIDE-SIMETH 200-200-20 MG/5ML PO SUSP
30.0000 mL | Freq: Once | ORAL | Status: AC
  Administered 2019-05-04: 30 mL via ORAL
  Filled 2019-05-04: qty 30

## 2019-05-04 MED ORDER — DICYCLOMINE HCL 10 MG/ML IM SOLN
20.0000 mg | Freq: Once | INTRAMUSCULAR | Status: AC
  Administered 2019-05-04: 06:00:00 20 mg via INTRAMUSCULAR
  Filled 2019-05-04: qty 2

## 2019-05-04 MED ORDER — SODIUM CHLORIDE 0.9% FLUSH: 3.0000 mL | Freq: Once | INTRAVENOUS | Status: DC

## 2019-05-04 MED ORDER — KETOROLAC TROMETHAMINE 30 MG/ML IJ SOLN
30.0000 mg | Freq: Once | INTRAMUSCULAR | Status: AC
  Administered 2019-05-04: 06:00:00 30 mg via INTRAVENOUS
  Filled 2019-05-04: qty 1

## 2019-05-04 NOTE — ED Notes (Signed)
Pt returned from CT °

## 2019-05-04 NOTE — ED Provider Notes (Addendum)
[facility]   CSN: [id number] Arrival date & time: 05/04/19  [id number]    History   Chief Complaint Chief Complaint  Patient presents with  . Abdominal Pain    HPI Steven [name] is a 53 y.o. male.     The history is provided by the patient.  Abdominal Pain  Pain location:  RUQ and epigastric Pain quality: dull   Pain radiates to:  Does not radiate Pain severity:  Severe Onset quality:  Gradual Duration:  1 week Timing:  Constant Progression:  Unchanged Chronicity:  New Context: not alcohol use, not diet changes and not medication withdrawal   Context comment:  Has thrush.  Is able to eat ice cream and milk Relieved by:  Nothing Worsened by:  Nothing Ineffective treatments: home suboxone. Associated symptoms: no anorexia, no chest pain, no chills, no constipation, no cough, no fever, no hematemesis, no hematochezia and no shortness of breath   Risk factors: not elderly and not obese     Past Medical History:  Diagnosis Date  . AIDS (HCC) 1985   . Asthma   . Cerebral hemorrhage (HCC) 2000  . Hemophilia (HCC)   . Hepatitis C    never treated  . HIV (human immunodeficiency virus infection) [facility](HCC)     Patient Active Problem List   Diagnosis Date Noted  . Weight loss 05/15/2018  . Tobacco abuse 02/26/2018  . Opioid use disorder (HCC) 01/23/2018  . HTN (hypertension) 06/20/2017  . Condyloma acuminatum of perianal region 01/08/2017  . Homeless 01/08/2017  . Urinary hesitancy 01/08/2017  . COPD exacerbation (HCC) 01/12/2016  . Depression 08/04/2015  . Hepatitis C 08/02/2015  . HIV disease (HCC) 07/28/2015  . Other pancytopenia (HCC) 07/28/2015  . Esophageal tear   . Gastrointestinal hemorrhage with melena   . GI bleed 07/26/2015  . Numbness on left side 07/15/2013    Past Surgical History:  Procedure Laterality Date  . ESOPHAGOGASTRODUODENOSCOPY (EGD) WITH PROPOFOL N/A 07/27/2015   Procedure:  ESOPHAGOGASTRODUODENOSCOPY (EGD) WITH PROPOFOL;  Surgeon: Hilarie FredricksonJohn N Perry, MD;  Location: [facility] ENDOSCOPY;  Service: Endoscopy;  Laterality: N/A;  . HERNIA REPAIR    . laporotomy    . MANDIBLE SURGERY          Home Medications    Prior to Admission medications   Medication Sig Start Date End Date Taking? Authorizing Provider  albuterol (PROVENTIL HFA;VENTOLIN HFA) 108 (90 Base) MCG/ACT inhaler Inhale 1-2 puffs into the lungs every 6 (six) hours as needed for wheezing or shortness of breath. 10/15/18   Ginnie SmartHatcher, Jeffrey C, MD  amoxicillin (AMOXIL) 500 MG capsule Take 1 capsule (500 mg total) by mouth 3 (three) times daily. 04/30/19   Hall-Potvin, [place], PA-C  Buprenorphine HCl-Naloxone HCl 8-2 MG FILM Place 2 Film under the tongue daily for 14 days. 05/07/19 05/21/19  Gust RungHoffman, Erik C, DO  cyclobenzaprine (FLEXERIL) 10 MG tablet Take 1 tablet (10 mg total) by mouth 3 (three) times daily as needed for muscle spasms. 10/15/18   Ginnie SmartHatcher, Jeffrey C, MD  darunavir-cobicistat (PREZCOBIX) 800-150 MG tablet Take 1 tablet by mouth daily with breakfast. 10/15/18   Ginnie SmartHatcher, Jeffrey C, MD  diclofenac sodium (VOLTAREN) 1 % GEL Apply 2 g topically 4 (four) times daily. 10/15/18   Ginnie SmartHatcher, Jeffrey C, MD  diphenhydrAMINE (BENADRYL) 25 mg capsule Take 25 mg by mouth every 6 (six) hours as needed for allergies.    [provider]  dolutegravir (TIVICAY) 50 MG tablet Take 1  tablet (50 mg total) by mouth daily. 10/15/18   Ginnie Smart, MD  dronabinol (MARINOL) 2.5 MG capsule TAKE ONE CAPSULE BY MOUTH TWICE DAILY BEFORE A MEAL 10/15/18   Ginnie Smart, MD  DULoxetine (CYMBALTA) 60 MG capsule Take 1 capsule (60 mg total) by mouth daily. 10/15/18   Ginnie Smart, MD  emtricitabine-tenofovir AF (DESCOVY) 200-25 MG tablet Take 1 tablet by mouth daily. 10/15/18   Ginnie Smart, MD  ENSURE (ENSURE) Take 237 mLs by mouth 2 (two) times daily between meals. Do not take with medications. 06/11/18    Ginnie Smart, MD  gabapentin (NEURONTIN) 300 MG capsule Take 1 capsule (300 mg total) by mouth 3 (three) times daily. 10/15/18   Ginnie Smart, MD  hydroxypropyl methylcellulose (ISOPTO TEARS) 2.5 % ophthalmic solution Place 1 drop into both eyes 3 (three) times daily as needed for dry eyes.    [provider]  lidocaine (XYLOCAINE) 2 % solution Use as directed 15 mLs in the mouth or throat as needed for mouth pain. 04/30/19   Hall-Potvin, [place], PA-C  lisinopril (PRINIVIL,ZESTRIL) 20 MG tablet Take 1 tablet (20 mg total) by mouth daily. 10/15/18   Ginnie Smart, MD  meloxicam (MOBIC) 7.5 MG tablet Take 1 tablet (7.5 mg total) by mouth daily. 10/15/18   Ginnie Smart, MD  mometasone-formoterol (DULERA) 100-5 MCG/ACT AERO Inhale 2 puffs into the lungs 2 (two) times daily. 10/15/18   Ginnie Smart, MD  Multiple Vitamin (MULTIVITAMIN WITH MINERALS) TABS tablet Take 1 tablet by mouth daily.    [provider]  nicotine (NICODERM CQ) 21 mg/24hr patch Place 1 patch (21 mg total) onto the skin daily. 10/15/18   Ginnie Smart, MD  promethazine (PHENERGAN) 25 MG suppository Place 1 suppository (25 mg total) rectally every 6 (six) hours as needed for nausea or vomiting. 10/15/18   Ginnie Smart, MD  terazosin (HYTRIN) 1 MG capsule Take 1 capsule (1 mg total) by mouth at bedtime. 10/15/18   Ginnie Smart, MD  traZODone (DESYREL) 100 MG tablet Take 1 tablet (100 mg total) by mouth at bedtime. 10/15/18   Ginnie Smart, MD    Family History Family History  Problem Relation Age of Onset  . CAD Mother   . Stroke Mother     Social History Social History   Tobacco Use  . Smoking status: Current Every Day Smoker    Packs/day: 2.00    Years: 38.00    Pack years: 76.00    Types: Cigarettes  . Smokeless tobacco: Never Used  . Tobacco comment: 1.5 PPD  Substance Use Topics  . Alcohol use: Yes    Alcohol/week: 0.0 standard drinks     Comment: 6 pack per week per patient   . Drug use: No     Allergies   Aspirin   Review of Systems Review of Systems  Constitutional: Negative for chills and fever.  Respiratory: Negative for cough and shortness of breath.   Cardiovascular: Negative for chest pain.  Gastrointestinal: Positive for abdominal pain. Negative for anorexia, constipation, hematemesis and hematochezia.  All other systems reviewed and are negative.    Physical Exam Updated Vital Signs BP 120/85   Pulse 72   Temp 99.2 F (37.3 C) (Oral)   Resp 18   SpO2 100%   Physical Exam Vitals signs and nursing note reviewed.  Constitutional:      General: He is not in acute distress.  Appearance: He is normal weight.  HENT:     Head: Normocephalic.     Nose: Nose normal.     Mouth/Throat:     Mouth: Mucous membranes are moist.     Comments: No oral thrush Eyes:     Conjunctiva/sclera: Conjunctivae normal.     Pupils: Pupils are equal, round, and reactive to light.  Neck:     Musculoskeletal: Normal range of motion and neck supple.  Cardiovascular:     Rate and Rhythm: Normal rate and regular rhythm.     Pulses: Normal pulses.     Heart sounds: Normal heart sounds.  Pulmonary:     Effort: Pulmonary effort is normal.     Breath sounds: Normal breath sounds. No wheezing or rales.  Abdominal:     General: Abdomen is flat. Bowel sounds are normal.     Tenderness: There is no abdominal tenderness. There is no guarding or rebound. Negative signs include Murphy's sign and McBurney's sign.  Musculoskeletal: Normal range of motion.  Skin:    General: Skin is warm and dry.     Capillary Refill: Capillary refill takes less than 2 seconds.  Neurological:     General: No focal deficit present.     Mental Status: He is alert and oriented to person, place, and time.  Psychiatric:        Mood and Affect: Mood normal.        Behavior: Behavior normal.      ED Treatments / Results  Labs (all labs  ordered are listed, but only abnormal results are displayed) Results for orders placed or performed during the hospital encounter of 05/04/19  Lipase, blood  Result Value Ref Range   Lipase 20 11 - 51 U/L  Comprehensive metabolic panel  Result Value Ref Range   Sodium 133 (L) 135 - 145 mmol/L   Potassium 4.4 3.5 - 5.1 mmol/L   Chloride 95 (L) 98 - 111 mmol/L   CO2 29 22 - 32 mmol/L   Glucose, Bld 106 (H) 70 - 99 mg/dL   BUN 8 6 - 20 mg/dL   Creatinine, Ser 9.09 0.61 - 1.24 mg/dL   Calcium 9.7 8.9 - 31.1 mg/dL   Total Protein 7.5 6.5 - 8.1 g/dL   Albumin 3.7 3.5 - 5.0 g/dL   AST 19 15 - 41 U/L   ALT 16 0 - 44 U/L   Alkaline Phosphatase 74 38 - 126 U/L   Total Bilirubin 0.5 0.3 - 1.2 mg/dL   GFR calc non Af Amer >60 >60 mL/min   GFR calc Af Amer >60 >60 mL/min   Anion gap 9 5 - 15  CBC  Result Value Ref Range   WBC 7.0 4.0 - 10.5 K/uL   RBC 4.23 4.22 - 5.81 MIL/uL   Hemoglobin 12.9 (L) 13.0 - 17.0 g/dL   HCT 21.6 (L) 24.4 - 69.5 %   MCV 91.7 80.0 - 100.0 fL   MCH 30.5 26.0 - 34.0 pg   MCHC 33.2 30.0 - 36.0 g/dL   RDW 07.2 25.7 - 50.5 %   Platelets 201 150 - 400 K/uL   nRBC 0.0 0.0 - 0.2 %  Urinalysis, Routine w reflex microscopic  Result Value Ref Range   Color, Urine YELLOW YELLOW   APPearance CLEAR CLEAR   Specific Gravity, Urine 1.019 1.005 - 1.030   pH 7.0 5.0 - 8.0   Glucose, UA NEGATIVE NEGATIVE mg/dL   Hgb urine dipstick NEGATIVE NEGATIVE  Bilirubin Urine NEGATIVE NEGATIVE   Ketones, ur NEGATIVE NEGATIVE mg/dL   Protein, ur NEGATIVE NEGATIVE mg/dL   Nitrite NEGATIVE NEGATIVE   Leukocytes,Ua NEGATIVE NEGATIVE   Ct Abdomen Pelvis W Contrast  Result Date: 05/04/2019 CLINICAL DATA:  Right upper quadrant pain x4 days EXAM: CT ABDOMEN AND PELVIS WITH CONTRAST TECHNIQUE: Multidetector CT imaging of the abdomen and pelvis was performed using the standard protocol following bolus administration of intravenous contrast. CONTRAST:  OMNIPAQUE IOHEXOL 300 MG/ML   SOLN COMPARISON:  Chest/abdominal radiographs dated 07/26/2015. CT abdomen/pelvis dated 03/07/2014. FINDINGS: Lower chest: Lung bases are clear. Hepatobiliary: Liver is within normal limits. Gallbladder is unremarkable. No intrahepatic or extrahepatic ductal dilatation. Pancreas: Within normal limits. Spleen: Within normal limits. Adrenals/Urinary Tract: Adrenal glands are within normal limits. Kidneys are within normal limits.  No hydronephrosis. Bladder is within normal limits. Stomach/Bowel: Stomach is within normal limits. No evidence of bowel obstruction. Normal appendix (coronal image 81). Vascular/Lymphatic: No evidence of abdominal aortic aneurysm. Atherosclerotic calcifications of the abdominal aorta and branch vessels. No suspicious abdominopelvic lymphadenopathy. Reproductive: Prostate is unremarkable. Other: No abdominopelvic ascites. Small fat containing periumbilical hernia (series 3/image 33). Musculoskeletal: Degenerative changes of the visualized thoracolumbar spine. IMPRESSION: No evidence of bowel obstruction.  Normal appendix. No CT findings to account for the patient's right upper quadrant abdominal pain. Electronically Signed   By: Charline Bills M.D.   On: 05/04/2019 07:23    Procedures Procedures (including critical care time)  Medications Ordered in ED Medications  sodium chloride flush (NS) 0.9 % injection 3 mL (has no administration in time range)  ketorolac (TORADOL) 30 MG/ML injection 30 mg (30 mg Intravenous Given 05/04/19 0612)  dicyclomine (BENTYL) injection 20 mg (20 mg Intramuscular Given 05/04/19 0612)     Patient updated on the plan.  Refill and medication sent through the pharmacy   Final Clinical Impressions(s) / ED Diagnoses   Return for intractable cough, coughing up blood,fevers >100.4 unrelieved by medication, shortness of breath, intractable vomiting, chest pain, shortness of breath, weakness,numbness, changes in speech, facial asymmetry,abdominal  pain, passing out,Inability to tolerate liquids or food, cough, altered mental status or any concerns. No signs of systemic illness or infection. The patient is nontoxic-appearing on exam and vital signs are within normal limits.   I have reviewed the triage vital signs and the nursing notes. Pertinent labs &imaging results that were available during my care of the patient were reviewed by me and considered in my medical decision making (see chart for details).  After history, exam, and medical workup I feel the patient has been appropriately medically screened and is safe for discharge home. Pertinent diagnoses were discussed with the patient. Patient was given return precautions   Kimbella Heisler, MD 05/04/19 [id number]

## 2019-05-04 NOTE — ED Triage Notes (Signed)
Pt c/o RUQ pain for the past 4 days. Pain worsens with drinking or eating. Seen his PCP for throat pain and prescribed viscous lidocaine for thrush. Reports pain in RUQ that he "forgot to tell my doctor about." and is concerned it's his gallbladder

## 2019-05-20 ENCOUNTER — Encounter: Payer: Self-pay | Admitting: Internal Medicine

## 2019-05-20 ENCOUNTER — Ambulatory Visit (INDEPENDENT_AMBULATORY_CARE_PROVIDER_SITE_OTHER): Payer: Medicaid Other | Admitting: Internal Medicine

## 2019-05-20 ENCOUNTER — Other Ambulatory Visit: Payer: Self-pay

## 2019-05-20 VITALS — BP 106/70 | HR 74 | Temp 98.1°F | Ht 71.0 in | Wt 171.6 lb

## 2019-05-20 DIAGNOSIS — Z590 Homelessness: Secondary | ICD-10-CM

## 2019-05-20 DIAGNOSIS — L02411 Cutaneous abscess of right axilla: Secondary | ICD-10-CM

## 2019-05-20 DIAGNOSIS — Z79899 Other long term (current) drug therapy: Secondary | ICD-10-CM

## 2019-05-20 DIAGNOSIS — F1120 Opioid dependence, uncomplicated: Secondary | ICD-10-CM | POA: Diagnosis not present

## 2019-05-20 DIAGNOSIS — F1199 Opioid use, unspecified with unspecified opioid-induced disorder: Secondary | ICD-10-CM

## 2019-05-20 DIAGNOSIS — F1190 Opioid use, unspecified, uncomplicated: Secondary | ICD-10-CM

## 2019-05-20 DIAGNOSIS — Z872 Personal history of diseases of the skin and subcutaneous tissue: Secondary | ICD-10-CM

## 2019-05-20 DIAGNOSIS — Z21 Asymptomatic human immunodeficiency virus [HIV] infection status: Secondary | ICD-10-CM

## 2019-05-20 MED ORDER — BUPRENORPHINE HCL-NALOXONE HCL 8-2 MG SL FILM: 2.0000 | ORAL_FILM | Freq: Every day | SUBLINGUAL | 0 refills | Status: DC

## 2019-05-20 NOTE — Patient Instructions (Addendum)
Mr. Loberg,   Continue taking your Suboxone as usual. We sent a new prescription to your pharmacy. Follow up with [place] in 4 weeks. Call us if you have any questions or concerns.   - Dr. Frederico Hamman

## 2019-05-20 NOTE — Progress Notes (Signed)
Internal Medicine Clinic Attending  I saw and evaluated the patient.  I personally confirmed the key portions of the history and exam documented by Dr. Santos-Sanchez and I reviewed pertinent patient test results.  The assessment, diagnosis, and plan were formulated together and I agree with the documentation in the resident's note. 

## 2019-05-20 NOTE — Assessment & Plan Note (Signed)
Reports doing well on current dose. Last UDS reviewed and appropriate for Suboxone. Database also appropriate.  His only complaint today is a right axillary abscess that he plans to have drained at an urgent care after today's visit. Also unhome for 2 weeks, working on housing. Resources provided.   - Continue Suboxone 8-2 mg BID (#60, no RF) - Utox today  - Follow up in 4 weeks

## 2019-05-20 NOTE — Addendum Note (Signed)
Addended by: Gilles Chiquito B on: 05/20/2019 01:35 PM   Modules accepted: Orders

## 2019-05-20 NOTE — Addendum Note (Signed)
Addended by: Welford Roche on: 05/20/2019 11:58 AM   Modules accepted: Orders

## 2019-05-20 NOTE — Progress Notes (Signed)
   05/20/2019  Steven [name] presents for follow up of opioid use disorder I have reviewed the prior induction visit, follow up visits, and telephone encounters relevant to opiate use disorder (OUD) treatment.   Current daily dose: Suboxone 8-2 mg BID (16 TDD)  Date of Induction: 02/20/2018  Current follow up interval, in weeks: 4 weeks   The patient has been adherent with the buprenorphine for OUD contract.   Last UDS Result: 01/2019, appropriate for Suboxone. Also showed THC and methamphetamine   HPI: Steven [name] presents for OUD follow up. He is doing well on his current dose of Suboxone of 16 mg TDD. Denies cravings or relapses. He has an unstable housing situation and has been unhome for the past 2 weeks. States it has been difficult to find shelter because they are full or not taking new people. He is working on this and we will provide resources for this today.  He had an I&D recently for a R axillary abscess and has a new one in the same area. He is planning to go to urgent care after today's visit to have it drained. No fever or chills. HIV seems to be well controlled. Compliant with his medications. Has an appt with [facility].   Exam:   Vitals:   05/20/19 1057 05/20/19 1101  BP:  106/70  Pulse:  74  Temp:  98.1 F (36.7 C)  SpO2:  98%  Weight: 171 lb 9.6 oz (77.8 kg)   Height: 5\' 11"  (1.803 m)    General: well-appearing male in no acute distress  CV: RRR, no mrg  RUE: axillary abscess that is erythematous and warmth to the touch   Assessment/Plan:  See Problem Based Charting in the Encounters Tab   Welford Roche, MD  05/20/2019  11:28 AM

## 2019-05-21 ENCOUNTER — Ambulatory Visit (HOSPITAL_COMMUNITY)
Admission: EM | Admit: 2019-05-21 | Discharge: 2019-05-21 | Disposition: A | Discharge status: Home / Self Care | Payer: Medicaid Other | Attending: Internal Medicine | Admitting: Internal Medicine

## 2019-05-21 ENCOUNTER — Encounter (HOSPITAL_COMMUNITY): Payer: Self-pay | Admitting: Emergency Medicine

## 2019-05-21 ENCOUNTER — Other Ambulatory Visit: Payer: Self-pay

## 2019-05-21 DIAGNOSIS — L0291 Cutaneous abscess, unspecified: Secondary | ICD-10-CM

## 2019-05-21 DIAGNOSIS — S40861A Insect bite (nonvenomous) of right upper arm, initial encounter: Secondary | ICD-10-CM

## 2019-05-21 MED ORDER — LIDOCAINE-EPINEPHRINE (PF) 2 %-1:200000 IJ SOLN
INTRAMUSCULAR | Status: AC
  Filled 2019-05-21: qty 20

## 2019-05-21 MED ORDER — DOXYCYCLINE HYCLATE 100 MG PO CAPS: 100.0000 mg | ORAL_CAPSULE | Freq: Two times a day (BID) | ORAL | 0 refills | Status: DC

## 2019-05-21 NOTE — ED Provider Notes (Signed)
[facility]    CSN: [id number] Arrival date & time: 05/21/19  1041      History   Chief Complaint Chief Complaint  Patient presents with  . Abscess  . Tick Removal    HPI Steven [name] is a 53 y.o. male with a history of HIV on HAART comes to urgent care with complaints of right axillary abscess which drained last night.  Abscess started several days ago and is gotten progressively larger.  Pain is increased with pain is currently about 8 or 9 out of 10.  No fever or chills.  No nausea or vomiting.  Patient also had a tick bite about 2 weeks ago.  Duration of tick bite was less than 72 hours.  Tick was not engorged.  Patient removed the tick by himself.  No nausea or vomiting.  Pain is aggravated by touching.  No known relieving factors.  Pain is nonradiating.  Pain is throbbing in nature.   HPI  Past Medical History:  Diagnosis Date  . AIDS (HCC) 1985   . Asthma   . Cerebral hemorrhage (HCC) 2000  . Hemophilia (HCC)   . Hepatitis C    never treated  . HIV (human immunodeficiency virus infection) [facility](HCC)     Patient Active Problem List   Diagnosis Date Noted  . Weight loss 05/15/2018  . Tobacco abuse 02/26/2018  . Opioid use disorder (HCC) 01/23/2018  . HTN (hypertension) 06/20/2017  . Condyloma acuminatum of perianal region 01/08/2017  . Homeless 01/08/2017  . Urinary hesitancy 01/08/2017  . COPD exacerbation (HCC) 01/12/2016  . Depression 08/04/2015  . Hepatitis C 08/02/2015  . HIV disease (HCC) 07/28/2015  . Other pancytopenia (HCC) 07/28/2015  . Esophageal tear   . Gastrointestinal hemorrhage with melena   . GI bleed 07/26/2015  . Numbness on left side 07/15/2013    Past Surgical History:  Procedure Laterality Date  . ESOPHAGOGASTRODUODENOSCOPY (EGD) WITH PROPOFOL N/A 07/27/2015   Procedure: ESOPHAGOGASTRODUODENOSCOPY (EGD) WITH PROPOFOL;  Surgeon: Hilarie FredricksonJohn N Perry, MD;  Location: [facility];  Service: Endoscopy;  Laterality: N/A;  . HERNIA  REPAIR    . laporotomy    . MANDIBLE SURGERY         Home Medications    Prior to Admission medications   Medication Sig Start Date End Date Taking? Authorizing Provider  albuterol (PROVENTIL HFA;VENTOLIN HFA) 108 (90 Base) MCG/ACT inhaler Inhale 1-2 puffs into the lungs every 6 (six) hours as needed for wheezing or shortness of breath. 10/15/18   Ginnie SmartHatcher, Jeffrey C, MD  amoxicillin (AMOXIL) 500 MG capsule Take 1 capsule (500 mg total) by mouth 3 (three) times daily. 04/30/19   Hall-Potvin, [place], PA-C  Buprenorphine HCl-Naloxone HCl 8-2 MG FILM Place 2 Film under the tongue daily for 30 days. 05/20/19 06/19/19  Inez CatalinaMullen, Emily B, MD  cyclobenzaprine (FLEXERIL) 10 MG tablet Take 1 tablet (10 mg total) by mouth 3 (three) times daily as needed for muscle spasms. 10/15/18   Ginnie SmartHatcher, Jeffrey C, MD  darunavir-cobicistat (PREZCOBIX) 800-150 MG tablet Take 1 tablet by mouth daily with breakfast. 10/15/18   Ginnie SmartHatcher, Jeffrey C, MD  diclofenac sodium (VOLTAREN) 1 % GEL Apply 2 g topically 4 (four) times daily. 10/15/18   Ginnie SmartHatcher, Jeffrey C, MD  diphenhydrAMINE (BENADRYL) 25 mg capsule Take 25 mg by mouth every 6 (six) hours as needed for allergies.    [provider]  dolutegravir (TIVICAY) 50 MG tablet Take 1 tablet (50 mg total) by mouth daily. 10/15/18  Ginnie SmartHatcher, Jeffrey C, MD  dronabinol (MARINOL) 2.5 MG capsule TAKE ONE CAPSULE BY MOUTH TWICE DAILY BEFORE A MEAL Patient taking differently: Take 2.5 mg by mouth 2 (two) times daily before lunch and supper.  10/15/18   Ginnie SmartHatcher, Jeffrey C, MD  DULoxetine (CYMBALTA) 60 MG capsule Take 1 capsule (60 mg total) by mouth daily. 10/15/18   Ginnie SmartHatcher, Jeffrey C, MD  emtricitabine-tenofovir AF (DESCOVY) 200-25 MG tablet Take 1 tablet by mouth daily. 10/15/18   Ginnie SmartHatcher, Jeffrey C, MD  ENSURE (ENSURE) Take 237 mLs by mouth 2 (two) times daily between meals. Do not take with medications. 06/11/18   Ginnie SmartHatcher, Jeffrey C, MD  gabapentin (NEURONTIN) 300 MG  capsule Take 1 capsule (300 mg total) by mouth 3 (three) times daily. 10/15/18   Ginnie SmartHatcher, Jeffrey C, MD  hydroxypropyl methylcellulose (ISOPTO TEARS) 2.5 % ophthalmic solution Place 1 drop into both eyes 3 (three) times daily as needed for dry eyes.    [provider]  lidocaine (XYLOCAINE) 2 % solution Use as directed 15 mLs in the mouth or throat as needed for mouth pain. 04/30/19   Hall-Potvin, [place], PA-C  lisinopril (PRINIVIL,ZESTRIL) 20 MG tablet Take 1 tablet (20 mg total) by mouth daily. 10/15/18   Ginnie SmartHatcher, Jeffrey C, MD  meloxicam (MOBIC) 7.5 MG tablet Take 1 tablet (7.5 mg total) by mouth daily. 10/15/18   Ginnie SmartHatcher, Jeffrey C, MD  mometasone-formoterol (DULERA) 100-5 MCG/ACT AERO Inhale 2 puffs into the lungs 2 (two) times daily. 10/15/18   Ginnie SmartHatcher, Jeffrey C, MD  Multiple Vitamin (MULTIVITAMIN WITH MINERALS) TABS tablet Take 1 tablet by mouth daily.    [provider]  nicotine (NICODERM CQ) 21 mg/24hr patch Place 1 patch (21 mg total) onto the skin daily. Patient not taking: Reported on 05/04/2019 10/15/18   Ginnie SmartHatcher, Jeffrey C, MD  omeprazole (PRILOSEC) 20 MG capsule Take 1 capsule (20 mg total) by mouth daily. 05/04/19   Palumbo, April, MD  promethazine (PHENERGAN) 25 MG suppository Place 1 suppository (25 mg total) rectally every 6 (six) hours as needed for nausea or vomiting. 10/15/18   Ginnie SmartHatcher, Jeffrey C, MD  sucralfate (CARAFATE) 1 GM/10ML suspension Take 10 mLs (1 g total) by mouth 4 (four) times daily -  with meals and at bedtime. 05/04/19   Palumbo, April, MD  terazosin (HYTRIN) 1 MG capsule Take 1 capsule (1 mg total) by mouth at bedtime. 05/04/19   Palumbo, April, MD  traZODone (DESYREL) 100 MG tablet Take 1 tablet (100 mg total) by mouth at bedtime. 10/15/18   Ginnie SmartHatcher, Jeffrey C, MD    Family History Family History  Problem Relation Age of Onset  . CAD Mother   . Stroke Mother     Social History Social History   Tobacco Use  . Smoking status: Current  Every Day Smoker    Packs/day: 2.00    Years: 38.00    Pack years: 76.00    Types: Cigarettes  . Smokeless tobacco: Never Used  . Tobacco comment: 1.5 PPD  Substance Use Topics  . Alcohol use: Yes    Alcohol/week: 0.0 standard drinks    Comment: 6 pack per week per patient   . Drug use: No     Allergies   Aspirin   Review of Systems Review of Systems  Constitutional: Negative for activity change, appetite change, chills, fatigue and fever.  HENT: Negative.   Respiratory: Negative for chest tightness, shortness of breath and wheezing.   Cardiovascular: Negative.   Gastrointestinal: Negative for abdominal  distention, abdominal pain, diarrhea, nausea and vomiting.  Endocrine: Negative.   Genitourinary: Negative.   Musculoskeletal: Negative.   Skin: Positive for color change and wound. Negative for rash.  Neurological: Negative for dizziness, weakness and light-headedness.     Physical Exam Triage Vital Signs ED Triage Vitals [05/21/19 1115]  Enc Vitals Group     BP 122/70     Pulse Rate 68     Resp 18     Temp 98.3 F (36.8 C)     Temp Source Oral     SpO2 99 %     Weight      Height      Head Circumference      Peak Flow      Pain Score 7     Pain Loc      Pain Edu?      Excl. in [place]?    No data found.  Updated Vital Signs BP 122/70 (BP Location: Right Arm)   Pulse 68   Temp 98.3 F (36.8 C) (Oral)   Resp 18   SpO2 99%   Visual Acuity Right Eye Distance:   Left Eye Distance:   Bilateral Distance:    Right Eye Near:   Left Eye Near:    Bilateral Near:     Physical Exam Constitutional:      Appearance: Normal appearance. He is not ill-appearing or toxic-appearing.  HENT:     Mouth/Throat:     Mouth: Mucous membranes are moist.     Pharynx: No posterior oropharyngeal erythema.  Eyes:     Conjunctiva/sclera: Conjunctivae normal.  Cardiovascular:     Rate and Rhythm: Normal rate and regular rhythm.     Pulses: Normal pulses.     Heart  sounds: Normal heart sounds.  Pulmonary:     Effort: Pulmonary effort is normal.     Breath sounds: Normal breath sounds.  Abdominal:     General: Bowel sounds are normal.     Palpations: Abdomen is soft.  Musculoskeletal: Normal range of motion.  Skin:    Capillary Refill: Capillary refill takes less than 2 seconds.     Comments: Abscess in the right axilla.  Abscess has purulent drainage. Abscess measures about 2 inches in longest diameter  Neurological:     Mental Status: He is alert.      UC Treatments / Results  Labs (all labs ordered are listed, but only abnormal results are displayed) Labs Reviewed - No data to display  EKG None  Radiology No results found.  Procedures Incision and Drainage  Date/Time: 05/21/2019 5:18 PM Performed by: Chase Picket, MD Authorized by: Chase Picket, MD   Consent:    Consent obtained:  Verbal   Consent given by:  Patient   Risks discussed:  Bleeding, incomplete drainage, pain and damage to other organs   Alternatives discussed:  No treatment and observation Location:    Type:  Abscess   Size:  2 inches by 2 inches   Location:  Upper extremity   Upper extremity location:  Arm   Arm location:  R upper arm Pre-procedure details:    Skin preparation:  Betadine Anesthesia (see MAR for exact dosages):    Anesthesia method:  Local infiltration   Local anesthetic:  Lidocaine 2% WITH epi Procedure type:    Complexity:  Complex Procedure details:    Needle aspiration: no     Incision types:  Single straight   Incision depth:  Subcutaneous  Scalpel blade:  11   Wound management:  Probed and deloculated   Drainage:  Purulent   Drainage amount:  Moderate   Packing materials:  1/2 in gauze   Amount 1/2":  3 inches Post-procedure details:    Patient tolerance of procedure:  Tolerated well, no immediate complications   (including critical care time)  Medications Ordered in UC Medications - No data to display   Initial Impression / Assessment and Plan / UC Course  I have reviewed the triage vital signs and the nursing notes.  Pertinent labs & imaging results that were available during my care of the patient were reviewed by me and considered in my medical decision making (see chart for details).    1.  Left axilla  abscess: Incision and drainage Doxycycline 100 mg twice daily for 10 days Education on wound care following incision and drainage given  2.  Tick bite less than 72 hours: No indication for prophylaxis against Lyme's disease Patient is however being treated with doxycycline for left axillary abscess. Final Clinical Impressions(s) / UC Diagnoses   Final diagnoses:  None   Discharge Instructions   None    ED Prescriptions    None     Controlled Substance Prescriptions  Controlled Substance Registry consulted? No   Merrilee JanskyLamptey, Philip O, MD 05/21/19 1721

## 2019-05-21 NOTE — ED Triage Notes (Signed)
Pt here for abscess in right axillary area; pt sts also wants to have tick bite looked at

## 2019-05-25 LAB — TOXASSURE SELECT,+ANTIDEPR,UR

## 2019-06-03 ENCOUNTER — Ambulatory Visit (INDEPENDENT_AMBULATORY_CARE_PROVIDER_SITE_OTHER): Payer: Medicaid Other | Admitting: Infectious Diseases

## 2019-06-03 ENCOUNTER — Encounter: Payer: Self-pay | Admitting: Infectious Diseases

## 2019-06-03 ENCOUNTER — Other Ambulatory Visit (HOSPITAL_COMMUNITY)
Admission: RE | Admit: 2019-06-03 | Discharge: 2019-06-03 | Disposition: A | Discharge status: Home / Self Care | Payer: Medicaid Other | Source: Ambulatory Visit | Attending: Infectious Diseases | Admitting: Infectious Diseases

## 2019-06-03 ENCOUNTER — Other Ambulatory Visit: Payer: Self-pay

## 2019-06-03 ENCOUNTER — Other Ambulatory Visit: Payer: Self-pay | Admitting: Infectious Diseases

## 2019-06-03 VITALS — BP 121/75 | HR 71 | Temp 98.5°F | Wt 173.0 lb

## 2019-06-03 DIAGNOSIS — A630 Anogenital (venereal) warts: Secondary | ICD-10-CM | POA: Diagnosis not present

## 2019-06-03 DIAGNOSIS — M25562 Pain in left knee: Secondary | ICD-10-CM

## 2019-06-03 DIAGNOSIS — G8929 Other chronic pain: Secondary | ICD-10-CM

## 2019-06-03 DIAGNOSIS — Z5900 Homelessness unspecified: Secondary | ICD-10-CM

## 2019-06-03 DIAGNOSIS — M25561 Pain in right knee: Secondary | ICD-10-CM

## 2019-06-03 DIAGNOSIS — B20 Human immunodeficiency virus [HIV] disease: Secondary | ICD-10-CM | POA: Diagnosis not present

## 2019-06-03 DIAGNOSIS — Z113 Encounter for screening for infections with a predominantly sexual mode of transmission: Secondary | ICD-10-CM

## 2019-06-03 DIAGNOSIS — M25552 Pain in left hip: Secondary | ICD-10-CM | POA: Diagnosis not present

## 2019-06-03 DIAGNOSIS — Z720 Tobacco use: Secondary | ICD-10-CM | POA: Diagnosis not present

## 2019-06-03 DIAGNOSIS — I10 Essential (primary) hypertension: Secondary | ICD-10-CM | POA: Diagnosis not present

## 2019-06-03 DIAGNOSIS — F1190 Opioid use, unspecified, uncomplicated: Secondary | ICD-10-CM

## 2019-06-03 DIAGNOSIS — J441 Chronic obstructive pulmonary disease with (acute) exacerbation: Secondary | ICD-10-CM

## 2019-06-03 DIAGNOSIS — M25551 Pain in right hip: Secondary | ICD-10-CM

## 2019-06-03 DIAGNOSIS — F1199 Opioid use, unspecified with unspecified opioid-induced disorder: Secondary | ICD-10-CM | POA: Diagnosis not present

## 2019-06-03 DIAGNOSIS — R634 Abnormal weight loss: Secondary | ICD-10-CM

## 2019-06-03 DIAGNOSIS — F320 Major depressive disorder, single episode, mild: Secondary | ICD-10-CM | POA: Diagnosis not present

## 2019-06-03 DIAGNOSIS — Z79899 Other long term (current) drug therapy: Secondary | ICD-10-CM

## 2019-06-03 DIAGNOSIS — B182 Chronic viral hepatitis C: Secondary | ICD-10-CM

## 2019-06-03 MED ORDER — TRAZODONE HCL 100 MG PO TABS: 100.0000 mg | ORAL_TABLET | Freq: Every day | ORAL | 3 refills | Status: AC

## 2019-06-03 MED ORDER — TIVICAY 50 MG PO TABS: 50.0000 mg | ORAL_TABLET | Freq: Every day | ORAL | 5 refills | Status: DC

## 2019-06-03 MED ORDER — TERAZOSIN HCL 1 MG PO CAPS: 1.0000 mg | ORAL_CAPSULE | Freq: Every day | ORAL | 0 refills | Status: DC

## 2019-06-03 MED ORDER — DIPHENHYDRAMINE HCL 25 MG PO CAPS: 25.0000 mg | ORAL_CAPSULE | Freq: Four times a day (QID) | ORAL | 3 refills | Status: AC | PRN

## 2019-06-03 MED ORDER — LISINOPRIL 20 MG PO TABS: 20.0000 mg | ORAL_TABLET | Freq: Every day | ORAL | 3 refills | Status: DC

## 2019-06-03 MED ORDER — CYCLOBENZAPRINE HCL 10 MG PO TABS: 10.0000 mg | ORAL_TABLET | Freq: Three times a day (TID) | ORAL | 3 refills | Status: DC | PRN

## 2019-06-03 MED ORDER — PROMETHAZINE HCL 25 MG PO TABS: 25.0000 mg | ORAL_TABLET | ORAL | 3 refills | Status: DC | PRN

## 2019-06-03 MED ORDER — MOMETASONE FURO-FORMOTEROL FUM 100-5 MCG/ACT IN AERO: 2.0000 | INHALATION_SPRAY | Freq: Two times a day (BID) | RESPIRATORY_TRACT | 3 refills | Status: DC

## 2019-06-03 MED ORDER — GABAPENTIN 300 MG PO CAPS: 300.0000 mg | ORAL_CAPSULE | Freq: Three times a day (TID) | ORAL | 0 refills | Status: DC

## 2019-06-03 MED ORDER — PREZCOBIX 800-150 MG PO TABS: 1.0000 | ORAL_TABLET | Freq: Every day | ORAL | 3 refills | Status: DC

## 2019-06-03 MED ORDER — DESCOVY 200-25 MG PO TABS: 1.0000 | ORAL_TABLET | Freq: Every day | ORAL | 3 refills | Status: AC

## 2019-06-03 MED ORDER — OMEPRAZOLE 20 MG PO CPDR: 20.0000 mg | DELAYED_RELEASE_CAPSULE | Freq: Every day | ORAL | 0 refills | Status: DC

## 2019-06-03 MED ORDER — DULOXETINE HCL 60 MG PO CPEP: 60.0000 mg | ORAL_CAPSULE | Freq: Every day | ORAL | 3 refills | Status: DC

## 2019-06-03 MED ORDER — MELOXICAM 7.5 MG PO TABS: 7.5000 mg | ORAL_TABLET | Freq: Every day | ORAL | 0 refills | Status: DC

## 2019-06-03 NOTE — Assessment & Plan Note (Signed)
Will repeat his u/s next year.  Hepatic Function Latest Ref Rng & Units 05/04/2019 03/21/2019 10/15/2018  Total Protein 6.5 - 8.1 g/dL 7.5 7.6 7.2  Albumin 3.5 - 5.0 g/dL 3.7 4.0 -  AST 15 - 41 U/L 19 35 16  ALT 0 - 44 U/L _0 Alk Phosphatase 38 - 126 U/L 74 68 -  Total Bilirubin 0.3 - 1.2 mg/dL 0.5 0.8 0.5  Bilirubin, Direct 0.1 - 0.5 mg/dL - - -

## 2019-06-03 NOTE — Addendum Note (Signed)
Addended by: Dolan Amen D on: 06/03/2019 04:27 PM   Modules accepted: Orders

## 2019-06-03 NOTE — Assessment & Plan Note (Signed)
He attributes this to "living outside".  He does not want ensure refill today.

## 2019-06-03 NOTE — Addendum Note (Signed)
Addended by: Dolan Amen D on: 06/03/2019 03:48 PM   Modules accepted: Orders

## 2019-06-03 NOTE — Assessment & Plan Note (Signed)
He is doing very well My great thanks to Dr Daryll Drown and the excellent care of the [facility].

## 2019-06-03 NOTE — Assessment & Plan Note (Signed)
Encouraged to quit. 

## 2019-06-03 NOTE — Assessment & Plan Note (Signed)
BP well controlled today, ACE-I refilled.

## 2019-06-03 NOTE — Assessment & Plan Note (Signed)
Offered/refused condoms.  Not sexually active His ART is refilled He will f/u with Lovena Le for his housing issues He is on Therapist, music.  Labs today rtc in 6 months

## 2019-06-03 NOTE — Addendum Note (Signed)
Addended by: Dolan Amen D on: 06/03/2019 04:02 PM   Modules accepted: Orders

## 2019-06-03 NOTE — Assessment & Plan Note (Addendum)
He will f/u with Lovena Le for his housing issues Has food stamp card.

## 2019-06-03 NOTE — Assessment & Plan Note (Signed)
No recurrence He had referral to [facility] study.  He will f/u.

## 2019-06-03 NOTE — Progress Notes (Signed)
   Subjective:    Patient ID: Steven [name], male    DOB: 1965-12-28, 53 y.o.   MRN: [id number]  HPI 53 yo M with hx of opioid use d/o and AIDS.  [facility].  He is on prezcobix/tivicay/descovey for his ART.  He needs med refills today.  States he is living outside. Mobile home he was living in is being cleaned by landlord.  No signs of w/d, feels like subonne is working well. Two 8/2 daily.  No problems with his ART, has 'missed some". Blames his landlord, stuff was hauled off by [employer].  Still follows at [facility].   L axillary cysts are resolved.   HIV 1 RNA Quant (copies/mL)  Date Value  10/15/2018 <20 NOT DETECTED  01/09/2018 <20 NOT DETECTED  05/08/2017 46 (H)   CD4 T Cell Abs (/uL)  Date Value  10/15/2018 190 (L)  01/09/2018 190 (L)  05/08/2017 220 (L)    Review of Systems  Constitutional: Negative for appetite change, chills, fever and unexpected weight change.  Respiratory: Negative for shortness of breath and stridor.   Gastrointestinal: Negative for constipation and diarrhea.  Genitourinary: Negative for difficulty urinating.  Psychiatric/Behavioral: Negative for dysphoric mood.  Please see HPI. All other systems reviewed and negative.      Objective:   Physical Exam Constitutional:      Appearance: Normal appearance.  HENT:     Mouth/Throat:     Mouth: Mucous membranes are moist.     Dentition: Abnormal dentition.     Pharynx: No oropharyngeal exudate.  Eyes:     Extraocular Movements: Extraocular movements intact.     Pupils: Pupils are equal, round, and reactive to light.  Neck:     Musculoskeletal: Normal range of motion and neck supple.  Cardiovascular:     Rate and Rhythm: Normal rate and regular rhythm.  Pulmonary:     Effort: Pulmonary effort is normal.     Breath sounds: Normal breath sounds.  Abdominal:     General: Bowel sounds are normal. There is no distension.     Palpations:  Abdomen is soft.     Tenderness: There is no abdominal tenderness.  Musculoskeletal:     Right lower leg: No edema.     Left lower leg: No edema.  Neurological:     General: No focal deficit present.     Mental Status: He is alert.  Psychiatric:        Mood and Affect: Mood is not depressed. Affect is not labile, flat or tearful.        Speech: Speech is rapid and pressured.           Assessment & Plan:

## 2019-06-04 LAB — URINE CYTOLOGY ANCILLARY ONLY
Chlamydia: NEGATIVE
Neisseria Gonorrhea: NEGATIVE

## 2019-06-04 LAB — T-HELPER CELL (CD4) - (RCID CLINIC ONLY)
CD4 % Helper T Cell: 9 % — ABNORMAL LOW (ref 33–65)
CD4 T Cell Abs: 226 /uL — ABNORMAL LOW (ref 400–1790)

## 2019-06-12 LAB — COMPREHENSIVE METABOLIC PANEL
AG Ratio: 1.3 (calc) (ref 1.0–2.5)
ALT: 11 U/L (ref 9–46)
AST: 18 U/L (ref 10–35)
Albumin: 4.3 g/dL (ref 3.6–5.1)
Alkaline phosphatase (APISO): 64 U/L (ref 35–144)
BUN: 10 mg/dL (ref 7–25)
CO2: 25 mmol/L (ref 20–32)
Calcium: 9.4 mg/dL (ref 8.6–10.3)
Chloride: 102 mmol/L (ref 98–110)
Creat: 0.79 mg/dL (ref 0.70–1.33)
Globulin: 3.4 g/dL (calc) (ref 1.9–3.7)
Glucose, Bld: 102 mg/dL — ABNORMAL HIGH (ref 65–99)
Potassium: 3.8 mmol/L (ref 3.5–5.3)
Sodium: 135 mmol/L (ref 135–146)
Total Bilirubin: 0.3 mg/dL (ref 0.2–1.2)
Total Protein: 7.7 g/dL (ref 6.1–8.1)

## 2019-06-12 LAB — HIV-1 RNA QUANT-NO REFLEX-BLD
HIV 1 RNA Quant: 32600 copies/mL — ABNORMAL HIGH
HIV-1 RNA Quant, Log: 4.51 Log copies/mL — ABNORMAL HIGH

## 2019-06-12 LAB — FLUORESCENT TREPONEMAL AB(FTA)-IGG-BLD: Fluorescent Treponemal ABS: REACTIVE — AB

## 2019-06-12 LAB — CBC
HCT: 40.6 % (ref 38.5–50.0)
Hemoglobin: 13.6 g/dL (ref 13.2–17.1)
MCH: 29.7 pg (ref 27.0–33.0)
MCHC: 33.5 g/dL (ref 32.0–36.0)
MCV: 88.6 fL (ref 80.0–100.0)
MPV: 11 fL (ref 7.5–12.5)
Platelets: 155 10*3/uL (ref 140–400)
RBC: 4.58 10*6/uL (ref 4.20–5.80)
RDW: 12.7 % (ref 11.0–15.0)
WBC: 5.6 10*3/uL (ref 3.8–10.8)

## 2019-06-12 LAB — RPR TITER: RPR Titer: 1:2 {titer} — ABNORMAL HIGH

## 2019-06-12 LAB — RPR: RPR Ser Ql: REACTIVE — AB

## 2019-06-19 ENCOUNTER — Other Ambulatory Visit: Payer: Self-pay | Admitting: Infectious Diseases

## 2019-06-19 MED ORDER — BUPRENORPHINE HCL-NALOXONE HCL 8-2 MG SL FILM: 2.0000 | ORAL_FILM | Freq: Every day | SUBLINGUAL | 0 refills | Status: DC

## 2019-06-19 NOTE — Telephone Encounter (Signed)
Needs refill on suboxene ;pt contact [facility] [place] [facility] [place]

## 2019-06-19 NOTE — Telephone Encounter (Signed)
I reviewed our office note from 6/16. He was doing well on MAT for OUD. I reviewed database which was fine. He likely missed our follow up appointment this week due to unhoused situation, low resources and social support. I will approve a 1 month refill. Please arrange for OUD follow up in 4 weeks.

## 2019-06-24 NOTE — Telephone Encounter (Signed)
Attempted to reach patient to schedule OUD appt. No answer on home number and VM has not been set up so unable to leave message. Mobile number states, "Call cannot be completed at this time." L. Silvano Rusk, RN, BSN

## 2019-07-15 ENCOUNTER — Other Ambulatory Visit: Payer: Self-pay | Admitting: *Deleted

## 2019-07-15 MED ORDER — BUPRENORPHINE HCL-NALOXONE HCL 8-2 MG SL FILM: 2.0000 | ORAL_FILM | Freq: Every day | SUBLINGUAL | 0 refills | Status: DC

## 2019-07-15 NOTE — Telephone Encounter (Signed)
Pt requesting an appt ;stating he will need suboxone refill soon. Talked to Dr Evette Doffing - no available appts until 8/25. Dr Evette Doffing stated he will refill enough med until his appt. Appt scheduled 8/25 @ 0945 AM - pt informed.

## 2019-07-27 ENCOUNTER — Other Ambulatory Visit: Payer: Self-pay | Admitting: Infectious Diseases

## 2019-07-27 DIAGNOSIS — B182 Chronic viral hepatitis C: Secondary | ICD-10-CM

## 2019-07-29 ENCOUNTER — Encounter: Payer: Self-pay | Admitting: Internal Medicine

## 2019-07-29 ENCOUNTER — Ambulatory Visit (INDEPENDENT_AMBULATORY_CARE_PROVIDER_SITE_OTHER): Payer: Medicaid Other | Admitting: Internal Medicine

## 2019-07-29 ENCOUNTER — Other Ambulatory Visit: Payer: Self-pay

## 2019-07-29 VITALS — BP 101/67 | HR 86 | Temp 98.1°F | Ht 70.0 in | Wt 181.7 lb

## 2019-07-29 DIAGNOSIS — Z590 Homelessness: Secondary | ICD-10-CM | POA: Diagnosis not present

## 2019-07-29 DIAGNOSIS — B1920 Unspecified viral hepatitis C without hepatic coma: Secondary | ICD-10-CM | POA: Diagnosis not present

## 2019-07-29 DIAGNOSIS — K429 Umbilical hernia without obstruction or gangrene: Secondary | ICD-10-CM | POA: Diagnosis not present

## 2019-07-29 DIAGNOSIS — Z21 Asymptomatic human immunodeficiency virus [HIV] infection status: Secondary | ICD-10-CM | POA: Diagnosis not present

## 2019-07-29 DIAGNOSIS — Z79899 Other long term (current) drug therapy: Secondary | ICD-10-CM

## 2019-07-29 DIAGNOSIS — F1120 Opioid dependence, uncomplicated: Secondary | ICD-10-CM

## 2019-07-29 DIAGNOSIS — F1199 Opioid use, unspecified with unspecified opioid-induced disorder: Secondary | ICD-10-CM

## 2019-07-29 DIAGNOSIS — F1190 Opioid use, unspecified, uncomplicated: Secondary | ICD-10-CM

## 2019-07-29 MED ORDER — BUPRENORPHINE HCL-NALOXONE HCL 8-2 MG SL FILM: 2.0000 | ORAL_FILM | Freq: Every day | SUBLINGUAL | 0 refills | Status: DC

## 2019-07-29 NOTE — Assessment & Plan Note (Signed)
Continuing OBAT w/ Suboxone 8-2mg  BID without difficulty. Prior UDS w/ alcohol and THC but buprenorphine present with metabolite. Has social stressors including housing needs which has currently resolved. Denies any episodes of relapse. Cravings well controlled. No withdrawal symptoms.  - C/w Suboxone 8-2mg  BID - Urine screen today - F/u in 4 weeks

## 2019-07-29 NOTE — Progress Notes (Signed)
   CC: Opioid Use Disorder  Steven [name] presents for follow up of opioid use disorder I have reviewed the prior induction visit, follow up visits, and telephone encounters relevant to opiate use disorder (OUD) treatment.  Current daily dose: Suboxone 8-2mg  BID  Date of Induction: 02/20/2018  Current follow up interval, in weeks: 4 weeks   The patient has been adherent with the buprenorphine for OUD contract.  Last UDS Result: Buprenorphine + metabolite + Alcohol + THC  HPI: [name] is a 53 y.o. M w/ PMH of unstable housing, HIV and Hep C who presents for continued OBAT. He states he is doing well, especially with a 'roof over his head.' He mentions that he has temporary housing at the moment as he is staying at his friend's house for couple month. He mentions having no difficulty taking his Suboxone as prescribed. Denies any cravings, withdrawal symptoms or relapses. He mentions that his friend accidentally threw out his 2 month supply of his HIV medications but he will plan on contacting Dr.Hatcher's office for refills. At the moment he has current month's supply of his HIV medications. Requesting paper scrip this visit for his suboxone as he had prior issue with pharmacy having difficulty locating script through EMR.  Past Medical History:  Diagnosis Date  . AIDS ([place]) 1985   . Asthma   . Cerebral hemorrhage ([place]) 2000  . Hemophilia ([place])   . Hepatitis C    never treated  . HIV (human immunodeficiency virus infection) ([place])    Review of Systems: Review of Systems  Constitutional: Negative for chills, fever and malaise/fatigue.  Respiratory: Negative for shortness of breath.   Cardiovascular: Negative for chest pain, palpitations and leg swelling.  Gastrointestinal: Negative for constipation, diarrhea, nausea and vomiting.  Neurological: Negative for tingling and tremors.     Physical Exam: Vitals:   07/29/19 0932  BP: 101/67  Pulse: 86  Temp: 98.1 F  (36.7 C)  TempSrc: Oral  SpO2: 96%  Weight: 181 lb 11.2 oz (82.4 kg)  Height: 5\' 10"  (1.778 m)    Physical Exam  Constitutional: He is oriented to person, place, and time. He appears well-developed and well-nourished. No distress.  Cardiovascular: Normal rate, regular rhythm and intact distal pulses.  No murmur heard. Respiratory: Effort normal and breath sounds normal. He has no wheezes. He has no rales.  GI: Soft. Bowel sounds are normal. He exhibits mass (reducible umbilical hernia).  Musculoskeletal: Normal range of motion.        General: No edema.  Neurological: He is alert and oriented to person, place, and time.  Skin: Skin is warm and dry.    Assessment & Plan:   Opioid use disorder (HCC) Continuing OBAT w/ Suboxone 8-2mg  BID without difficulty. Prior UDS w/ alcohol and THC but buprenorphine present with metabolite. Has social stressors including housing needs which has currently resolved. Denies any episodes of relapse. Cravings well controlled. No withdrawal symptoms.  - C/w Suboxone 8-2mg  BID - Urine screen today - F/u in 4 weeks   Patient seen with Dr. Daryll Drown   -Gilberto Better, PGY2 [place] [facility] Pager: [phone]

## 2019-07-29 NOTE — Patient Instructions (Signed)
Thank you for allowing [place] to provide your care today. Today we discussed your opioid use disorder.    Today we made no changes to your medications.  Please continues to take your medications as prescribed  Please follow-up in 4 weeks.    Should you have any questions or concerns please call the internal medicine clinic at [phone].

## 2019-07-30 NOTE — Progress Notes (Signed)
Internal Medicine Clinic Attending  I saw and evaluated the patient.  I personally confirmed the key portions of the history and exam documented by Dr. Lee and I reviewed pertinent patient test results.  The assessment, diagnosis, and plan were formulated together and I agree with the documentation in the resident's note.  

## 2019-07-30 NOTE — Addendum Note (Signed)
Addended by: Gilles Chiquito B on: 07/30/2019 02:18 PM   Modules accepted: Level of Service

## 2019-07-31 LAB — TOXASSURE SELECT,+ANTIDEPR,UR

## 2019-08-15 ENCOUNTER — Other Ambulatory Visit: Payer: Self-pay | Admitting: *Deleted

## 2019-08-15 MED ORDER — BUPRENORPHINE HCL-NALOXONE HCL 8-2 MG SL FILM: 2.0000 | ORAL_FILM | Freq: Every day | SUBLINGUAL | 0 refills | Status: DC

## 2019-08-15 NOTE — Telephone Encounter (Signed)
It is a bit early for a refill, but I went ahead and sent one for them to keep on file.    Thank you!

## 2019-08-15 NOTE — Addendum Note (Signed)
Addended by: Gilles Chiquito B on: 08/15/2019 01:05 PM   Modules accepted: Orders

## 2019-08-23 ENCOUNTER — Other Ambulatory Visit: Payer: Self-pay | Admitting: Infectious Diseases

## 2019-08-23 DIAGNOSIS — B20 Human immunodeficiency virus [HIV] disease: Secondary | ICD-10-CM

## 2019-09-11 ENCOUNTER — Other Ambulatory Visit: Payer: Self-pay

## 2019-09-11 ENCOUNTER — Ambulatory Visit (INDEPENDENT_AMBULATORY_CARE_PROVIDER_SITE_OTHER): Payer: Medicaid Other | Admitting: Infectious Diseases

## 2019-09-11 ENCOUNTER — Encounter: Payer: Self-pay | Admitting: Infectious Diseases

## 2019-09-11 DIAGNOSIS — F320 Major depressive disorder, single episode, mild: Secondary | ICD-10-CM

## 2019-09-11 DIAGNOSIS — F1199 Opioid use, unspecified with unspecified opioid-induced disorder: Secondary | ICD-10-CM

## 2019-09-11 DIAGNOSIS — B182 Chronic viral hepatitis C: Secondary | ICD-10-CM

## 2019-09-11 DIAGNOSIS — Z5900 Homelessness unspecified: Secondary | ICD-10-CM

## 2019-09-11 DIAGNOSIS — B20 Human immunodeficiency virus [HIV] disease: Secondary | ICD-10-CM

## 2019-09-11 DIAGNOSIS — F1190 Opioid use, unspecified, uncomplicated: Secondary | ICD-10-CM

## 2019-09-11 NOTE — Progress Notes (Signed)
   Subjective:    Patient ID: Steven [name], male    DOB: 1966/03/25, 53 y.o.   MRN: [id number]  HPI 53 yo M with hx of opioid use d/o and AIDS.  [facility]. Last seen 07-29-19. He has f/u 09-25-19 per pt. Last visit was phone.  He is on prezcobix/tivicay/descovey for his ART.  He is worried today because he is back 3 months early.  States he has been taking his medication sporadically. He wants a mild tranquilizer- ativan- for sleep and anxiety attacks. Or restoril.  Would like refill of tessalon.  Has had f/u with Lovena Le at [facility].  He is staying with a (schizophrenic-bipolar) friend, no longer sleeping in the woods (although has slept behind [employer]).  Gets food from [facility] today. He has not gotten food at [employer] due to [place].   Still taking trazadone, suboxonne.  Has had filling fall out of his L mandible. Not painful per pt. Awaiting dental f/u.   HIV 1 RNA Quant (copies/mL)  Date Value  06/03/2019 32,600 (H)  10/15/2018 <20 NOT DETECTED  01/09/2018 <20 NOT DETECTED   CD4 T Cell Abs (/uL)  Date Value  06/03/2019 226 (L)  10/15/2018 190 (L)  01/09/2018 190 (L)    Review of Systems  Constitutional: Negative for appetite change and unexpected weight change.  Respiratory: Negative for cough and shortness of breath.   Gastrointestinal: Negative for constipation and diarrhea.  Genitourinary: Negative for difficulty urinating.  Psychiatric/Behavioral: Positive for sleep disturbance. Negative for self-injury. The patient is nervous/anxious.       Objective:   Physical Exam Constitutional:      Appearance: Normal appearance.  HENT:     Mouth/Throat:     Mouth: Mucous membranes are moist.     Pharynx: No oropharyngeal exudate.     Comments: Poor dentition. Carried L 2nd and R 2nd molar.  Eyes:     Extraocular Movements: Extraocular movements intact.     Pupils: Pupils are equal, round, and reactive to light.  Neck:   Musculoskeletal: Normal range of motion and neck supple.  Cardiovascular:     Rate and Rhythm: Normal rate and regular rhythm.  Pulmonary:     Effort: Pulmonary effort is normal.     Breath sounds: Normal breath sounds.  Abdominal:     General: Bowel sounds are normal. There is no distension.     Palpations: Abdomen is soft.     Tenderness: There is no abdominal tenderness.  Neurological:     Mental Status: He is alert.  Psychiatric:        Mood and Affect: Mood normal.        Behavior: Behavior is hyperactive. Behavior is not agitated or aggressive.           Assessment & Plan:

## 2019-09-11 NOTE — Assessment & Plan Note (Signed)
Will recheck his labs today. With genotype.  Offered/refused condoms.  Flu shot today.  rtc in 6 weeks.

## 2019-09-11 NOTE — Assessment & Plan Note (Signed)
I encouraged him to f/u with [facility].  He still goes to groups there.

## 2019-09-11 NOTE — Assessment & Plan Note (Signed)
His RNA was (-) 2019 and 2018.  CT 04-2019 was normal liver.

## 2019-09-11 NOTE — Assessment & Plan Note (Signed)
Appreciate f/u of [facility].

## 2019-09-11 NOTE — Assessment & Plan Note (Addendum)
Greatly apprecaite [facility]. He may ask them to consider becoming his PCP. I will defer this request to them.  Does not feel like trazadone is working.  I encouraged him to f/u with [facility].

## 2019-09-19 ENCOUNTER — Other Ambulatory Visit: Payer: Self-pay | Admitting: Infectious Diseases

## 2019-09-19 DIAGNOSIS — G8929 Other chronic pain: Secondary | ICD-10-CM

## 2019-09-19 DIAGNOSIS — M25562 Pain in left knee: Secondary | ICD-10-CM

## 2019-09-19 DIAGNOSIS — B182 Chronic viral hepatitis C: Secondary | ICD-10-CM

## 2019-09-19 DIAGNOSIS — J441 Chronic obstructive pulmonary disease with (acute) exacerbation: Secondary | ICD-10-CM

## 2019-09-24 ENCOUNTER — Other Ambulatory Visit: Payer: Self-pay

## 2019-09-24 ENCOUNTER — Other Ambulatory Visit: Payer: Self-pay | Admitting: Internal Medicine

## 2019-09-24 NOTE — Telephone Encounter (Signed)
Requesting a refill on Suboxone @  [id number] - [place], [place] [phone] (Phone) [phone] (Fax)

## 2019-09-24 NOTE — Telephone Encounter (Signed)
Last UDS appropriate for [place] and no opioids, needs to keep next appointment but will refill, please schedule him within the next month for follow up in OUD

## 2019-09-24 NOTE — Telephone Encounter (Signed)
Patient notified of refill and is very appreciative. Scheduled in OUD 10/21/2019 at 1015. Patient understands the importance of keeping this appt. Hubbard Hartshorn, BSN, RN-BC

## 2019-09-24 NOTE — Telephone Encounter (Signed)
This request was sent to [place] via [place]. Hubbard Hartshorn, BSN, RN-BC

## 2019-09-25 LAB — HIV-1 INTEGRASE GENOTYPE

## 2019-09-25 LAB — HIV-1 RNA ULTRAQUANT REFLEX TO GENTYP+
HIV 1 RNA Quant: 32 copies/mL — ABNORMAL HIGH
HIV-1 RNA Quant, Log: 1.51 Log copies/mL — ABNORMAL HIGH

## 2019-10-21 ENCOUNTER — Other Ambulatory Visit: Payer: Self-pay

## 2019-10-21 ENCOUNTER — Ambulatory Visit (INDEPENDENT_AMBULATORY_CARE_PROVIDER_SITE_OTHER): Payer: Medicaid Other | Admitting: Internal Medicine

## 2019-10-21 VITALS — BP 122/85 | HR 83 | Temp 97.9°F | Wt 185.3 lb

## 2019-10-21 DIAGNOSIS — F1120 Opioid dependence, uncomplicated: Secondary | ICD-10-CM

## 2019-10-21 DIAGNOSIS — J449 Chronic obstructive pulmonary disease, unspecified: Secondary | ICD-10-CM | POA: Diagnosis not present

## 2019-10-21 DIAGNOSIS — F1190 Opioid use, unspecified, uncomplicated: Secondary | ICD-10-CM

## 2019-10-21 DIAGNOSIS — J441 Chronic obstructive pulmonary disease with (acute) exacerbation: Secondary | ICD-10-CM

## 2019-10-21 DIAGNOSIS — Z590 Homelessness: Secondary | ICD-10-CM

## 2019-10-21 DIAGNOSIS — F1199 Opioid use, unspecified with unspecified opioid-induced disorder: Secondary | ICD-10-CM

## 2019-10-21 MED ORDER — BENZONATATE 100 MG PO CAPS: 100.0000 mg | ORAL_CAPSULE | Freq: Two times a day (BID) | ORAL | 1 refills | Status: DC | PRN

## 2019-10-21 MED ORDER — BUPRENORPHINE HCL-NALOXONE HCL 8-2 MG SL FILM: 1.0000 | ORAL_FILM | Freq: Two times a day (BID) | SUBLINGUAL | 0 refills | Status: DC

## 2019-10-21 NOTE — Progress Notes (Signed)
   10/21/2019  Steven [name] presents for follow up of opioid use disorder I have reviewed the prior induction visit, follow up visits, and telephone encounters relevant to opiate use disorder (OUD) treatment.   Current daily dose: Suboxone 8-2mg  BID  Date of Induction: 02/20/2018  Current follow up interval, in weeks: 4 weeks   The patient has been adherent with the buprenorphine for OUD contract.Last UDS Result: Buprenorphine, Nor buprenorphine, THC, Ethyl sulfate and Ethyl Glucuronide, metabolite of clonazepam, Benzoylecgonine         HPI: Steven [name] is here for F/u on OUD. He mentions that he is doing great, taking his Suboxone every day as prescribed and without difficulty. He is happy with the result and mentions that he does not have any craving and no relapse for several months. He denies withdrawal symptoms. He mentions that he is working on his housing issue and has application for accomodation in shelter is under process. He denies any relapse. He mentions that he missed one or 2 visits because of COVID-19 situatuion and sometimes because of no transportation but states that he has been in touch via tele visits sometimes.   Exam:   Vitals:   10/21/19 0901  BP: 122/85  Pulse: 83  Temp: 97.9 F (36.6 C)  TempSrc: Oral  SpO2: 98%  Weight: 185 lb 4.8 oz (84.1 kg)   Physical Exam  Vitals reviewed. Constitutional: He is oriented to person, place, and time. He appears well-nourished. No distress.  Wearing mask  Cardiovascular: Normal rate and regular rhythm.  No murmur heard. Respiratory: Effort normal and breath sounds normal. No respiratory distress. He has no wheezes.  GI: Soft. Bowel sounds are normal. He exhibits no distension.  Musculoskeletal:        General: No edema.  Neurological: He is alert and oriented to person, place, and time.  Skin: He is not diaphoretic.  Psychiatric: He has a normal mood and affect. His behavior is normal.     Assessment/Plan:  See Problem Based Charting in the Encounters Tab     Dewayne Hatch, MD  10/21/2019  9:27 AM

## 2019-10-21 NOTE — Progress Notes (Signed)
Internal Medicine Clinic Attending  Case discussed with Dr. Masoudi  at the time of the visit.  We reviewed the resident's history and exam and pertinent patient test results.  I agree with the assessment, diagnosis, and plan of care documented in the resident's note.  

## 2019-10-21 NOTE — Patient Instructions (Signed)
It was our pleasure taking care of you in our clinic today.  I am glad that you are doing well. Please continue great job of taking Suboxone every day and avoid opioid.  We sent refill for Suboxone. Please pick it up ant take 2 films (under your tounge) every morning. Please come back to clinic in 4 weeks.  Please take rest of your medications as before and contact us if you have any question or concern.  Thank you

## 2019-10-21 NOTE — Assessment & Plan Note (Signed)
Patient was seen at [facility] today. He is currently un-housed, has missed some of his appointments but has regularly asked for Suboxone refills before running out of it and reports adherence to that.  He denies withdrawal symptoms, relapse or craving.  -Tox assure today -Sending refiil for Suboxone 8-2 BID -F/u in clinic in 4 weeks (pt informed and encouraged to give [place] a call if he has transportation issue or if he can not make it for his appointment)

## 2019-10-21 NOTE — Assessment & Plan Note (Addendum)
Patient asks for prescription for tessalon to be used PRN for cough. No evidence of COPD exacerbation. Lung exam is clear. -Sent prescription for [facility]

## 2019-10-25 LAB — TOXASSURE SELECT,+ANTIDEPR,UR

## 2019-10-29 ENCOUNTER — Ambulatory Visit (HOSPITAL_COMMUNITY)
Admission: EM | Admit: 2019-10-29 | Discharge: 2019-10-29 | Disposition: A | Discharge status: Home / Self Care | Payer: Medicaid Other | Attending: Family Medicine | Admitting: Family Medicine

## 2019-10-29 ENCOUNTER — Other Ambulatory Visit: Payer: Self-pay

## 2019-10-29 ENCOUNTER — Other Ambulatory Visit: Payer: Self-pay | Admitting: Infectious Diseases

## 2019-10-29 ENCOUNTER — Encounter (HOSPITAL_COMMUNITY): Payer: Self-pay | Admitting: Emergency Medicine

## 2019-10-29 ENCOUNTER — Telehealth (HOSPITAL_COMMUNITY): Payer: Self-pay | Admitting: Emergency Medicine

## 2019-10-29 DIAGNOSIS — F320 Major depressive disorder, single episode, mild: Secondary | ICD-10-CM

## 2019-10-29 DIAGNOSIS — B009 Herpesviral infection, unspecified: Secondary | ICD-10-CM | POA: Diagnosis not present

## 2019-10-29 DIAGNOSIS — J441 Chronic obstructive pulmonary disease with (acute) exacerbation: Secondary | ICD-10-CM

## 2019-10-29 MED ORDER — VALACYCLOVIR HCL 1 G PO TABS: 1000.0000 mg | ORAL_TABLET | Freq: Three times a day (TID) | ORAL | 1 refills | Status: AC

## 2019-10-29 MED ORDER — VALACYCLOVIR HCL 1 G PO TABS: 1000.0000 mg | ORAL_TABLET | Freq: Three times a day (TID) | ORAL | 1 refills | Status: DC

## 2019-10-29 NOTE — Telephone Encounter (Signed)
Spoke to pharmacy directly and recent computer script

## 2019-10-29 NOTE — ED Provider Notes (Signed)
[facility]    CSN: [id number] Arrival date & time: 10/29/19  0539      History   Chief Complaint Chief Complaint  Patient presents with  . Rash    HPI Steven [name] is a 53 y.o. male.   This is a 53 year old established [facility] urgent care patient who presents with a rash.  This started a week ago and is painful.  He is HIV positive, homeless, and positive for Hep C.       Past Medical History:  Diagnosis Date  . AIDS ([place]) 1985   . Asthma   . Cerebral hemorrhage ([place]) 2000  . 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 Hepatitis C    never treated  . HIV (human immunodeficiency virus infection) [facility])     Patient Active Problem List   Diagnosis Date Noted  . Weight loss 05/15/2018  . Tobacco abuse 02/26/2018  . Opioid use disorder ([place]) 01/23/2018  . HTN (hypertension) 06/20/2017  . Condyloma acuminatum of perianal region 01/08/2017  . Homeless 01/08/2017  . Urinary hesitancy 01/08/2017  . COPD exacerbation ([place]) 01/12/2016  . Depression 08/04/2015  . Hepatitis C 08/02/2015  . HIV disease ([place]) 07/28/2015  . Esophageal tear   . Gastrointestinal hemorrhage with melena   . GI bleed 07/26/2015  . Numbness on left side 07/15/2013    Past Surgical History:  Procedure Laterality Date  . ESOPHAGOGASTRODUODENOSCOPY (EGD) WITH PROPOFOL N/A 07/27/2015   Procedure: ESOPHAGOGASTRODUODENOSCOPY (EGD) WITH PROPOFOL;  Surgeon: Irene Shipper, MD;  Location: [facility] ENDOSCOPY;  Service: Endoscopy;  Laterality: N/A;  . HERNIA REPAIR    . laporotomy    . MANDIBLE SURGERY         Home Medications    Prior to Admission medications   Medication Sig Start Date End Date Taking? Authorizing Provider  albuterol (VENTOLIN HFA) 108 (90 Base) MCG/ACT inhaler INHALE 2 PUFFS BY MOUTH FOUR TIMES DAILY 06/04/19   Campbell Riches, MD  benzonatate (TESSALON PERLES) 100 MG capsule Take 1 capsule (100 mg total) by mouth 2 (two) times daily as needed for cough. 10/21/19 10/20/20  Axel Filler, MD  Buprenorphine HCl-Naloxone HCl (SUBOXONE) 8-2 MG FILM Place 1 Film under the tongue 2 (two) times daily. 10/21/19   Axel Filler, MD  cyclobenzaprine (FLEXERIL) 10 MG tablet Take 1 tablet (10 mg total) by mouth 3 (three) times daily as needed for muscle spasms. 06/03/19   Campbell Riches, MD  darunavir-cobicistat (PREZCOBIX) 800-150 MG tablet Take 1 tablet by mouth daily with breakfast. 06/03/19   Campbell Riches, MD  diclofenac sodium (VOLTAREN) 1 % GEL Apply 2 g topically 4 (four) times daily. 10/15/18   Campbell Riches, MD  diphenhydrAMINE (BENADRYL) 25 mg capsule Take 1 capsule (25 mg total) by mouth every 6 (six) hours as needed for allergies. 06/03/19   Campbell Riches, MD  dronabinol (MARINOL) 2.5 MG capsule TAKE ONE CAPSULE BY MOUTH TWICE DAILY BEFORE A MEAL 10/15/18   Campbell Riches, MD  DULERA 100-5 MCG/ACT AERO INHALE 2 PUFFS INTO THE LUNGS TWICE DAILY 09/19/19   Campbell Riches, MD  DULoxetine (CYMBALTA) 60 MG capsule Take 1 capsule (60 mg total) by mouth daily. 06/03/19   Campbell Riches, MD  emtricitabine-tenofovir AF (DESCOVY) 200-25 MG tablet Take 1 tablet by mouth daily. 06/03/19   Campbell Riches, MD  ENSURE (ENSURE) Take 237 mLs by mouth 2 (two) times daily between meals. Do not take with medications. 06/11/18  Ginnie SmartHatcher, Jeffrey C, MD  gabapentin (NEURONTIN) 300 MG capsule TAKE ONE CAPSULE BY MOUTH THREE TIMES DAILY 09/19/19   Ginnie SmartHatcher, Jeffrey C, MD  hydroxypropyl methylcellulose (ISOPTO TEARS) 2.5 % ophthalmic solution Place 1 drop into both eyes 3 (three) times daily as needed for dry eyes.    [provider]  lisinopril (ZESTRIL) 20 MG tablet Take 1 tablet (20 mg total) by mouth daily. 06/03/19   Ginnie SmartHatcher, Jeffrey C, MD  meloxicam (MOBIC) 7.5 MG tablet Take 1 tablet (7.5 mg total) by mouth daily. 06/03/19   Ginnie SmartHatcher, Jeffrey C, MD  Multiple Vitamin (MULTIVITAMIN WITH MINERALS) TABS tablet Take 1 tablet by mouth daily.     [provider]  omeprazole (PRILOSEC) 20 MG capsule TAKE 1 CAPSULE(20 MG) BY MOUTH DAILY 09/19/19   Ginnie SmartHatcher, Jeffrey C, MD  promethazine (PHENERGAN) 25 MG tablet Take 1 tablet (25 mg total) by mouth as needed for nausea or vomiting. 06/03/19   Ginnie SmartHatcher, Jeffrey C, MD  sucralfate (CARAFATE) 1 GM/10ML suspension TAKE 10 MLS BY MOUTH FOUR TIMES DAILY WITH MEALS AND AT BEDTIME 06/04/19   Ginnie SmartHatcher, Jeffrey C, MD  terazosin (HYTRIN) 1 MG capsule TAKE 1 CAPSULE(1 MG) BY MOUTH AT BEDTIME 09/19/19   Ginnie SmartHatcher, Jeffrey C, MD  TIVICAY 50 MG tablet TAKE 1 TABLET(50 MG) BY MOUTH DAILY 08/25/19   Ginnie SmartHatcher, Jeffrey C, MD  traZODone (DESYREL) 100 MG tablet Take 1 tablet (100 mg total) by mouth at bedtime. 06/03/19   Ginnie SmartHatcher, Jeffrey C, MD  valACYclovir (VALTREX) 1000 MG tablet Take 1 tablet (1,000 mg total) by mouth 3 (three) times daily for 14 days. 10/29/19 11/12/19  Elvina SidleLauenstein, Amaiya Scruton, MD    Family History Family History  Problem Relation Age of Onset  . CAD Mother   . Stroke Mother     Social History Social History   Tobacco Use  . Smoking status: Current Every Day Smoker    Packs/day: 2.00    Years: 38.00    Pack years: 76.00    Types: Cigarettes  . Smokeless tobacco: Never Used  . Tobacco comment: 1.5 PPD  Substance Use Topics  . Alcohol use: Yes    Alcohol/week: 0.0 standard drinks    Comment: 6 pack per week per patient   . Drug use: No     Allergies   Aspirin   Review of Systems Review of Systems  Constitutional: Negative for fever.  Respiratory: Negative for cough.   Skin: Positive for rash.     Physical Exam Triage Vital Signs ED Triage Vitals  Enc Vitals Group     BP 10/29/19 1023 (!) 138/98     Pulse Rate 10/29/19 1023 83     Resp 10/29/19 1023 18     Temp 10/29/19 1023 98.3 F (36.8 C)     Temp Source 10/29/19 1023 Oral     SpO2 10/29/19 1023 99 %     Weight --      Height --      Head Circumference --      Peak Flow --      Pain Score 10/29/19 1020 7      Pain Loc --      Pain Edu? --      Excl. in GC? --    No data found.  Updated Vital Signs BP (!) 138/98 (BP Location: Left Arm)   Pulse 83   Temp 98.3 F (36.8 C) (Oral)   Resp 18   SpO2 99%    Physical Exam Vitals  signs and nursing note reviewed.  Constitutional:      Appearance: Normal appearance.  HENT:     Head: Atraumatic.  Eyes:     Conjunctiva/sclera: Conjunctivae normal.  Neck:     Musculoskeletal: Normal range of motion and neck supple.  Pulmonary:     Effort: Pulmonary effort is normal.  Musculoskeletal: Normal range of motion.  Skin:    Findings: Rash present.  Neurological:     General: No focal deficit present.     Mental Status: He is alert and oriented to person, place, and time.  Psychiatric:        Mood and Affect: Mood normal.        Behavior: Behavior normal.        Thought Content: Thought content normal.            UC Treatments / Results  Labs (all labs ordered are listed, but only abnormal results are displayed) Labs Reviewed - No data to display  EKG   Radiology No results found.  Procedures Procedures (including critical care time)  Medications Ordered in UC Medications - No data to display  Initial Impression / Assessment and Plan / UC Course  I have reviewed the triage vital signs and the nursing notes.  Pertinent labs & imaging results that were available during my care of the patient were reviewed by me and considered in my medical decision making (see chart for details).    Final Clinical Impressions(s) / UC Diagnoses   Final diagnoses:  HSV (herpes simplex virus) infection     Discharge Instructions     This is a herpes infection that requires antiviral treatment  Please come back if you are not getting better in 5 days.    ED Prescriptions    Medication Sig Dispense Auth. Provider   valACYclovir (VALTREX) 1000 MG tablet Take 1 tablet (1,000 mg total) by mouth 3 (three) times daily for 14 days. 21  tablet Elvina Sidle, MD     I have reviewed the PDMP during this encounter.   Elvina Sidle, MD 10/29/19 1042

## 2019-10-29 NOTE — ED Triage Notes (Signed)
Patient has a rash from waist down, splotchy, red rash.  Rash appears from waist down.  Patient noticed rash 3 days ago.  No itching

## 2019-10-29 NOTE — Discharge Instructions (Addendum)
This is a herpes infection that requires antiviral treatment  Please come back if you are not getting better in 5 days.

## 2019-11-12 ENCOUNTER — Telehealth: Payer: Self-pay | Admitting: Infectious Diseases

## 2019-11-12 NOTE — Telephone Encounter (Signed)
Called pt back, got recording stating could not leave message appt 12/15 at 0915

## 2019-11-12 NOTE — Telephone Encounter (Signed)
Pt contact regarding OUD appt; [phone]

## 2019-11-19 MED ORDER — BUPRENORPHINE HCL-NALOXONE HCL 8-2 MG SL FILM: 1.0000 | ORAL_FILM | Freq: Two times a day (BID) | SUBLINGUAL | 0 refills | Status: DC

## 2019-11-19 NOTE — Telephone Encounter (Signed)
Need refill on Buprenorphine HCl-Naloxone HCl (SUBOXONE) 8-2 MG FILM ;pt contact [phone]  Pls contact pt

## 2019-12-01 ENCOUNTER — Telehealth: Payer: Self-pay | Admitting: Infectious Diseases

## 2019-12-01 NOTE — Telephone Encounter (Signed)
Called pt - no answer,unable to leave a message "call cannot be completed, call back later".

## 2019-12-01 NOTE — Telephone Encounter (Signed)
Pls contact [phone] regarding OUD appt

## 2019-12-09 ENCOUNTER — Other Ambulatory Visit: Payer: Self-pay

## 2019-12-09 ENCOUNTER — Ambulatory Visit (INDEPENDENT_AMBULATORY_CARE_PROVIDER_SITE_OTHER): Payer: Medicaid Other | Admitting: Infectious Diseases

## 2019-12-09 ENCOUNTER — Encounter: Payer: Self-pay | Admitting: Infectious Diseases

## 2019-12-09 VITALS — BP 107/67 | HR 101 | Wt 183.0 lb

## 2019-12-09 DIAGNOSIS — B20 Human immunodeficiency virus [HIV] disease: Secondary | ICD-10-CM | POA: Diagnosis present

## 2019-12-09 DIAGNOSIS — F1199 Opioid use, unspecified with unspecified opioid-induced disorder: Secondary | ICD-10-CM

## 2019-12-09 DIAGNOSIS — Z23 Encounter for immunization: Secondary | ICD-10-CM

## 2019-12-09 DIAGNOSIS — Z5900 Homelessness unspecified: Secondary | ICD-10-CM

## 2019-12-09 DIAGNOSIS — Z79899 Other long term (current) drug therapy: Secondary | ICD-10-CM

## 2019-12-09 DIAGNOSIS — A539 Syphilis, unspecified: Secondary | ICD-10-CM

## 2019-12-09 DIAGNOSIS — F1190 Opioid use, unspecified, uncomplicated: Secondary | ICD-10-CM

## 2019-12-09 DIAGNOSIS — K089 Disorder of teeth and supporting structures, unspecified: Secondary | ICD-10-CM | POA: Diagnosis not present

## 2019-12-09 DIAGNOSIS — A630 Anogenital (venereal) warts: Secondary | ICD-10-CM | POA: Diagnosis not present

## 2019-12-09 DIAGNOSIS — Z113 Encounter for screening for infections with a predominantly sexual mode of transmission: Secondary | ICD-10-CM

## 2019-12-09 MED ORDER — PENICILLIN G BENZATHINE 1200000 UNIT/2ML IM SUSP
2.4000 10*6.[IU] | Freq: Once | INTRAMUSCULAR | Status: AC
  Administered 2019-12-09: 2.4 10*6.[IU] via INTRAMUSCULAR

## 2019-12-09 MED ORDER — PENICILLIN G BENZATHINE 1200000 UNIT/2ML IM SUSP
1.2000 10*6.[IU] | Freq: Once | INTRAMUSCULAR | Status: AC
  Administered 2019-12-09: 1.2 10*6.[IU] via INTRAMUSCULAR

## 2019-12-09 MED ORDER — VALACYCLOVIR HCL 1 G PO TABS: 1000.0000 mg | ORAL_TABLET | Freq: Two times a day (BID) | ORAL | 0 refills | Status: AC

## 2019-12-09 MED ORDER — PENICILLIN G BENZATHINE 1200000 UNIT/2ML IM SUSP: 1.2000 10*6.[IU] | Freq: Once | INTRAMUSCULAR | Status: AC

## 2019-12-09 NOTE — Assessment & Plan Note (Signed)
Will defer further w/u til his other lesions are improved.

## 2019-12-09 NOTE — Assessment & Plan Note (Signed)
Will check his labs  He swears he is not sexually active.  Offer condoms.  Refill his ART as needed.  Flu shot.  pcv vax are uptodate.  rtc in 4 months.

## 2019-12-09 NOTE — Assessment & Plan Note (Signed)
Will work on getting him into dental clinic.  Currently not able to see him.

## 2019-12-09 NOTE — Progress Notes (Addendum)
   Subjective:    Patient ID: Steven [name], male    DOB: 12-May-1966, 54 y.o.   MRN: [id number]  HPI 54 yo M with hx of opioid use d/o and AIDS.  He has been followed at [facility] for suboxonne.  He is on prezcobix/tivicay/descovey for his ART.  He was seen at urgent care 10-29-19 and was dx as shingles (or HSV) on his L thigh, buttocks, penis. He was given valtrex for 14 days.  He would like refill.  He has scaling blisters on his feet now.  He is renting a room now, no longer outsid States he has been getting his suboxonne over the phone.   HIV 1 RNA Quant (copies/mL)  Date Value  09/11/2019 32 (H)  06/03/2019 32,600 (H)  10/15/2018 <20 NOT DETECTED   CD4 T Cell Abs (/uL)  Date Value  06/03/2019 226 (L)  10/15/2018 190 (L)  01/09/2018 190 (L)     Review of Systems  Constitutional: Negative for appetite change and unexpected weight change.  HENT: Positive for dental problem.   Respiratory: Negative for shortness of breath.   Gastrointestinal: Negative for constipation and diarrhea.  Genitourinary: Negative for difficulty urinating.  Skin: Positive for rash.  still needs dental eval.  Please see HPI. All other systems reviewed and negative.      Objective:   Physical Exam HENT:     Mouth/Throat:     Mouth: Mucous membranes are moist.     Pharynx: No posterior oropharyngeal erythema.     Comments: Poor dentition.  Eyes:     Extraocular Movements: Extraocular movements intact.     Pupils: Pupils are equal, round, and reactive to light.  Cardiovascular:     Rate and Rhythm: Normal rate and regular rhythm.  Pulmonary:     Effort: Pulmonary effort is normal.     Breath sounds: Normal breath sounds.  Abdominal:     General: Bowel sounds are normal.     Palpations: Abdomen is soft.    Musculoskeletal:     Cervical back: Normal range of motion and neck supple.       Feet:  Neurological:     Mental Status: He is alert.  Psychiatric:        Mood and Affect:  Affect is inappropriate.        Speech: Speech is rapid and pressured.         Assessment & Plan:

## 2019-12-09 NOTE — Assessment & Plan Note (Signed)
Greatly appreciate [facility] f/u.  He seems quite elevated today.

## 2019-12-09 NOTE — Progress Notes (Signed)
During patient intake patient began to take off pants to show this writer blisters on his thigh and legs. Asked patient to wait until the provider was in the room to see as I couldn't do anything but he was adamant to show me so I could document it. Patient has was appeared to be healed sores on thigh. Patient also showed writer scabbed blisters on the bottom of his feet and arm.   Provider notified of patients lesions.  Genesee Nase Loyola Mast, RN

## 2019-12-09 NOTE — Addendum Note (Signed)
Addended by: Rosanna Randy on: 12/09/2019 04:22 PM   Modules accepted: Orders

## 2019-12-09 NOTE — Assessment & Plan Note (Signed)
By his report his housing is currently stable.

## 2019-12-10 ENCOUNTER — Other Ambulatory Visit: Payer: Self-pay | Admitting: Infectious Diseases

## 2019-12-11 ENCOUNTER — Telehealth: Payer: Self-pay

## 2019-12-11 NOTE — Telephone Encounter (Addendum)
Reached out to patient to inform him of positive syphilis test results, unable to leave voicemail as it is not set up.   Sylvi Rybolt Loyola Mast, RN

## 2019-12-12 LAB — HIV-1 RNA QUANT-NO REFLEX-BLD
HIV 1 RNA Quant: <20 copies/mL — AB
HIV-1 RNA Quant, Log: <1.3 Log copies/mL — AB

## 2019-12-12 LAB — COMPREHENSIVE METABOLIC PANEL
AG Ratio: 1.3 (calc) (ref 1.0–2.5)
ALT: 10 U/L (ref 9–46)
AST: 22 U/L (ref 10–35)
Albumin: 4.7 g/dL (ref 3.6–5.1)
Alkaline phosphatase (APISO): 76 U/L (ref 35–144)
BUN: 14 mg/dL (ref 7–25)
CO2: 24 mmol/L (ref 20–32)
Calcium: 9.9 mg/dL (ref 8.6–10.3)
Chloride: 101 mmol/L (ref 98–110)
Creat: 1.16 mg/dL (ref 0.70–1.33)
Globulin: 3.7 g/dL (calc) (ref 1.9–3.7)
Glucose, Bld: 90 mg/dL (ref 65–99)
Potassium: 4.1 mmol/L (ref 3.5–5.3)
Sodium: 136 mmol/L (ref 135–146)
Total Bilirubin: 0.6 mg/dL (ref 0.2–1.2)
Total Protein: 8.4 g/dL — ABNORMAL HIGH (ref 6.1–8.1)

## 2019-12-12 LAB — CBC
HCT: 41.2 % (ref 38.5–50.0)
Hemoglobin: 14 g/dL (ref 13.2–17.1)
MCH: 30.2 pg (ref 27.0–33.0)
MCHC: 34 g/dL (ref 32.0–36.0)
MCV: 89 fL (ref 80.0–100.0)
MPV: 10.1 fL (ref 7.5–12.5)
Platelets: 236 10*3/uL (ref 140–400)
RBC: 4.63 10*6/uL (ref 4.20–5.80)
RDW: 13.3 % (ref 11.0–15.0)
WBC: 7.6 10*3/uL (ref 3.8–10.8)

## 2019-12-12 LAB — RPR: RPR Ser Ql: REACTIVE — AB

## 2019-12-12 LAB — FLUORESCENT TREPONEMAL AB(FTA)-IGG-BLD: Fluorescent Treponemal ABS: REACTIVE — AB

## 2019-12-12 LAB — RPR TITER: RPR Titer: 1:512 {titer} — ABNORMAL HIGH

## 2019-12-13 LAB — PAIN MGMT, PROFILE 8 W/CONF, U
6 Acetylmorphine: NEGATIVE ng/mL
Alcohol Metabolites: POSITIVE ng/mL — AB (ref <500)
Alphahydroxyalprazolam: NEGATIVE ng/mL
Alphahydroxymidazolam: NEGATIVE ng/mL
Alphahydroxytriazolam: NEGATIVE ng/mL
Aminoclonazepam: NEGATIVE ng/mL
Amphetamines: NEGATIVE ng/mL
Benzodiazepines: POSITIVE ng/mL
Benzoylecgonine: 198675 ng/mL
Buprenorphine, Urine: POSITIVE ng/mL
Buprenorphine: 728.4 ng/mL
Cocaine Metabolite: POSITIVE ng/mL
Creatinine: 269 mg/dL
Ethyl Glucuronide (ETG): >100000 ng/mL
Ethyl Sulfate (ETS): 62647 ng/mL
Hydroxyethylflurazepam: NEGATIVE ng/mL
Lorazepam: NEGATIVE ng/mL
MDMA: NEGATIVE ng/mL
Marijuana Metabolite: 616 ng/mL
Marijuana Metabolite: POSITIVE ng/mL
Norbuprenorphine: 1156.2 ng/mL
Nordiazepam: NEGATIVE ng/mL
Opiates: NEGATIVE ng/mL
Oxazepam: 119 ng/mL
Oxidant: NEGATIVE ug/mL
Oxycodone: NEGATIVE ng/mL
Temazepam: NEGATIVE ng/mL
pH: 5.7 (ref 4.5–9.0)

## 2019-12-15 ENCOUNTER — Telehealth: Payer: Self-pay

## 2019-12-15 NOTE — Telephone Encounter (Signed)
Attempted to contact Mr. Beier to provide test results #2. Unable to reach patient nor able to leave voicemail as it is full/not set up.   Yash Cacciola Loyola Mast, RN

## 2019-12-15 NOTE — Telephone Encounter (Signed)
error 

## 2019-12-17 NOTE — Telephone Encounter (Addendum)
Per result note by Dr. Ninetta Lights patient was treated while I clinic on 12/09/2019 [facility]

## 2019-12-19 ENCOUNTER — Other Ambulatory Visit: Payer: Self-pay | Admitting: Infectious Diseases

## 2019-12-19 MED ORDER — BUPRENORPHINE HCL-NALOXONE HCL 8-2 MG SL FILM: 1.0000 | ORAL_FILM | Freq: Two times a day (BID) | SUBLINGUAL | 0 refills | Status: DC

## 2019-12-19 NOTE — Telephone Encounter (Signed)
Returned call to patient. Made first available appt in OUD for 12/30/2019 at 0845. He will run out of suboxone on 12/21/2019. Will forward refill request to OUD Attendings. Kinnie Feil, BSN, RN-BC

## 2019-12-19 NOTE — Telephone Encounter (Signed)
Will send a 2 week supply.  He needs to follow up as planned.  Thanks!

## 2019-12-19 NOTE — Telephone Encounter (Signed)
Need refill on Buprenorphine HCl-Naloxone HCl (SUBOXONE) 8-2 MG FILM  ;pt contact [phone]  [facility] #[id number] - , [place] - [address] AT [facility] [address]

## 2019-12-30 ENCOUNTER — Telehealth: Payer: Self-pay | Admitting: *Deleted

## 2019-12-30 ENCOUNTER — Ambulatory Visit (INDEPENDENT_AMBULATORY_CARE_PROVIDER_SITE_OTHER): Payer: Medicaid Other | Admitting: Internal Medicine

## 2019-12-30 ENCOUNTER — Other Ambulatory Visit: Payer: Self-pay | Admitting: Infectious Diseases

## 2019-12-30 ENCOUNTER — Encounter: Payer: Self-pay | Admitting: Internal Medicine

## 2019-12-30 VITALS — BP 131/81 | HR 76 | Temp 98.4°F | Wt 191.8 lb

## 2019-12-30 DIAGNOSIS — G8929 Other chronic pain: Secondary | ICD-10-CM

## 2019-12-30 DIAGNOSIS — F1120 Opioid dependence, uncomplicated: Secondary | ICD-10-CM | POA: Diagnosis present

## 2019-12-30 DIAGNOSIS — F1190 Opioid use, unspecified, uncomplicated: Secondary | ICD-10-CM

## 2019-12-30 DIAGNOSIS — B182 Chronic viral hepatitis C: Secondary | ICD-10-CM

## 2019-12-30 DIAGNOSIS — J441 Chronic obstructive pulmonary disease with (acute) exacerbation: Secondary | ICD-10-CM

## 2019-12-30 DIAGNOSIS — F1199 Opioid use, unspecified with unspecified opioid-induced disorder: Secondary | ICD-10-CM

## 2019-12-30 MED ORDER — BENZONATATE 100 MG PO CAPS: 100.0000 mg | ORAL_CAPSULE | Freq: Two times a day (BID) | ORAL | 1 refills | Status: DC | PRN

## 2019-12-30 MED ORDER — PROMETHAZINE HCL 25 MG PO TABS: 25.0000 mg | ORAL_TABLET | ORAL | 3 refills | Status: AC | PRN

## 2019-12-30 MED ORDER — BUPRENORPHINE HCL-NALOXONE HCL 8-2 MG SL FILM: 1.0000 | ORAL_FILM | Freq: Two times a day (BID) | SUBLINGUAL | 0 refills | Status: DC

## 2019-12-30 MED ORDER — MELOXICAM 7.5 MG PO TABS: 7.5000 mg | ORAL_TABLET | Freq: Every day | ORAL | 0 refills | Status: DC

## 2019-12-30 MED ORDER — CYCLOBENZAPRINE HCL 10 MG PO TABS: 10.0000 mg | ORAL_TABLET | Freq: Three times a day (TID) | ORAL | 3 refills | Status: DC | PRN

## 2019-12-30 NOTE — Assessment & Plan Note (Signed)
Patient is here for OUD follow up, doing well on Suboxone twice daily. He is now on disability and is now renting an apartment. He reports cravings are well controlled and denies any relapses. We discussed his prior UDS, which was inappropriate for amphetamines. He denies use. His speech is very pressured today and mood seems elevated. Reviewed recent ID note where is was also noted to have pressured speech and some erratic behavior. I am somewhat concerned for amphetamine abuse, however he denies despite positive urine. Will repeat Utox today. I have sent 4 week supply of suboxone to his pharmacy. I have asked him to follow up in person in 1 month.

## 2019-12-30 NOTE — Telephone Encounter (Signed)
Patient walked in, filled out triage sheet and then left.  The message he wrote is "Please refill meds: Flexeril, tessalon, meloxicam, phenergan. Thank you" Please advise as these were last written 05/2019 (tessalon perles 10/2019). Andree Coss, RN

## 2019-12-30 NOTE — Progress Notes (Signed)
   12/30/2019  Steven [name] presents for follow up of opioid use disorder I have reviewed the prior induction visit, follow up visits, and telephone encounters relevant to opiate use disorder (OUD) treatment.   Current daily dose: Suboxone 8-2 mg BID  Date of Induction: 02/20/2018  Current follow up interval, in weeks: 4 weeks  The patient has been adherent with the buprenorphine for OUD contract.   Last UDS Result: Appropriate for buprenorphine, inappropriate for THC, amphetamine, and alcohol metabolites.    HPI: Patient is here for follow up of his opioid use disorder. For the details of today's visit, please refer to the assessment and plan.  Exam:   Vitals:   12/30/19 0900  BP: 131/81  Pulse: 76  Temp: 98.4 F (36.9 C)  TempSrc: Oral  SpO2: 95%  Weight: 191 lb 12.8 oz (87 kg)    Physical Exam Constitutional: NAD, appears comfortable Cardiovascular: RRR, no murmurs, rubs, or gallops.  Pulmonary/Chest: CTAB, no wheezes, rales, or rhonchi.  Neurological: A&Ox3, CN II - XII grossly intact.  Psychiatric: Pressured speech, elevated mood   Assessment/Plan:  See Problem Based Charting in the Encounters Tab     Steven Poll, MD  12/30/2019  12:00 PM

## 2019-12-30 NOTE — Patient Instructions (Signed)
Mr. Awbrey,  It was a pleasure to see you. Please continue to take your medicine as previously prescribed. Follow up with [place] again in 4 weeks.  If you have any questions or concerns, call our clinic at [phone] or after hours call [phone] and ask for the internal medicine resident on call. Thank you!  Dr. Antony Contras

## 2019-12-31 NOTE — Telephone Encounter (Signed)
Renewed 2 printed and are in my box thanks

## 2020-01-02 LAB — TOXASSURE SELECT,+ANTIDEPR,UR

## 2020-01-15 ENCOUNTER — Ambulatory Visit: Payer: Medicaid Other

## 2020-01-27 ENCOUNTER — Ambulatory Visit (INDEPENDENT_AMBULATORY_CARE_PROVIDER_SITE_OTHER): Payer: Medicaid Other | Admitting: Internal Medicine

## 2020-01-27 ENCOUNTER — Encounter: Payer: Self-pay | Admitting: Internal Medicine

## 2020-01-27 ENCOUNTER — Other Ambulatory Visit: Payer: Self-pay

## 2020-01-27 VITALS — BP 148/103 | HR 97 | Temp 99.1°F | Ht 70.0 in | Wt 182.7 lb

## 2020-01-27 DIAGNOSIS — G8929 Other chronic pain: Secondary | ICD-10-CM

## 2020-01-27 DIAGNOSIS — A539 Syphilis, unspecified: Secondary | ICD-10-CM

## 2020-01-27 DIAGNOSIS — R3911 Hesitancy of micturition: Secondary | ICD-10-CM

## 2020-01-27 DIAGNOSIS — B182 Chronic viral hepatitis C: Secondary | ICD-10-CM

## 2020-01-27 DIAGNOSIS — F1190 Opioid use, unspecified, uncomplicated: Secondary | ICD-10-CM

## 2020-01-27 DIAGNOSIS — H9192 Unspecified hearing loss, left ear: Secondary | ICD-10-CM | POA: Diagnosis not present

## 2020-01-27 DIAGNOSIS — N401 Enlarged prostate with lower urinary tract symptoms: Secondary | ICD-10-CM | POA: Diagnosis not present

## 2020-01-27 DIAGNOSIS — F1120 Opioid dependence, uncomplicated: Secondary | ICD-10-CM

## 2020-01-27 DIAGNOSIS — J441 Chronic obstructive pulmonary disease with (acute) exacerbation: Secondary | ICD-10-CM

## 2020-01-27 DIAGNOSIS — R3916 Straining to void: Secondary | ICD-10-CM

## 2020-01-27 DIAGNOSIS — Z21 Asymptomatic human immunodeficiency virus [HIV] infection status: Secondary | ICD-10-CM

## 2020-01-27 DIAGNOSIS — A5149 Other secondary syphilitic conditions: Secondary | ICD-10-CM | POA: Diagnosis not present

## 2020-01-27 DIAGNOSIS — Z0000 Encounter for general adult medical examination without abnormal findings: Secondary | ICD-10-CM | POA: Insufficient documentation

## 2020-01-27 DIAGNOSIS — I10 Essential (primary) hypertension: Secondary | ICD-10-CM | POA: Diagnosis present

## 2020-01-27 DIAGNOSIS — I2510 Atherosclerotic heart disease of native coronary artery without angina pectoris: Secondary | ICD-10-CM | POA: Insufficient documentation

## 2020-01-27 DIAGNOSIS — R351 Nocturia: Secondary | ICD-10-CM

## 2020-01-27 DIAGNOSIS — F1199 Opioid use, unspecified with unspecified opioid-induced disorder: Secondary | ICD-10-CM

## 2020-01-27 DIAGNOSIS — Z79899 Other long term (current) drug therapy: Secondary | ICD-10-CM | POA: Diagnosis not present

## 2020-01-27 DIAGNOSIS — R202 Paresthesia of skin: Secondary | ICD-10-CM | POA: Diagnosis not present

## 2020-01-27 DIAGNOSIS — K029 Dental caries, unspecified: Secondary | ICD-10-CM

## 2020-01-27 DIAGNOSIS — F320 Major depressive disorder, single episode, mild: Secondary | ICD-10-CM

## 2020-01-27 MED ORDER — BENZONATATE 100 MG PO CAPS: 100.0000 mg | ORAL_CAPSULE | Freq: Two times a day (BID) | ORAL | 0 refills | Status: AC | PRN

## 2020-01-27 MED ORDER — BUPRENORPHINE HCL-NALOXONE HCL 8-2 MG SL FILM: 1.0000 | ORAL_FILM | Freq: Two times a day (BID) | SUBLINGUAL | 0 refills | Status: DC

## 2020-01-27 MED ORDER — TERAZOSIN HCL 1 MG PO CAPS: ORAL_CAPSULE | ORAL | 5 refills | Status: DC

## 2020-01-27 MED ORDER — MELOXICAM 7.5 MG PO TABS: 7.5000 mg | ORAL_TABLET | Freq: Every day | ORAL | 0 refills | Status: DC

## 2020-01-27 MED ORDER — BENZONATATE 100 MG PO CAPS: 100.0000 mg | ORAL_CAPSULE | Freq: Two times a day (BID) | ORAL | 3 refills | Status: DC | PRN

## 2020-01-27 MED ORDER — DULOXETINE HCL 60 MG PO CPEP: ORAL_CAPSULE | ORAL | 3 refills | Status: AC

## 2020-01-27 MED ORDER — ALBUTEROL SULFATE HFA 108 (90 BASE) MCG/ACT IN AERS: 2.0000 | INHALATION_SPRAY | Freq: Four times a day (QID) | RESPIRATORY_TRACT | 3 refills | Status: AC

## 2020-01-27 MED ORDER — DRONABINOL 2.5 MG PO CAPS: ORAL_CAPSULE | ORAL | 4 refills | Status: AC

## 2020-01-27 MED ORDER — TERAZOSIN HCL 1 MG PO CAPS: 2.0000 mg | ORAL_CAPSULE | Freq: Every day | ORAL | 5 refills | Status: AC

## 2020-01-27 MED ORDER — GABAPENTIN 300 MG PO CAPS: 300.0000 mg | ORAL_CAPSULE | Freq: Three times a day (TID) | ORAL | 5 refills | Status: AC

## 2020-01-27 MED ORDER — OMEPRAZOLE 20 MG PO CPDR: DELAYED_RELEASE_CAPSULE | ORAL | 5 refills | Status: AC

## 2020-01-27 MED ORDER — CYCLOBENZAPRINE HCL 10 MG PO TABS: 10.0000 mg | ORAL_TABLET | Freq: Three times a day (TID) | ORAL | 3 refills | Status: AC | PRN

## 2020-01-27 MED ORDER — LISINOPRIL 20 MG PO TABS: 20.0000 mg | ORAL_TABLET | Freq: Every day | ORAL | 3 refills | Status: AC

## 2020-01-27 MED ORDER — ATORVASTATIN CALCIUM 40 MG PO TABS: 40.0000 mg | ORAL_TABLET | Freq: Every day | ORAL | 11 refills | Status: AC

## 2020-01-27 MED ORDER — DULERA 100-5 MCG/ACT IN AERO: 2.0000 | INHALATION_SPRAY | Freq: Two times a day (BID) | RESPIRATORY_TRACT | 5 refills | Status: AC

## 2020-01-27 NOTE — Assessment & Plan Note (Signed)
ASCVD: ASCVD score of 10.7%  Plan: -We will start Lipitor 40 mg daily

## 2020-01-27 NOTE — Assessment & Plan Note (Signed)
HM: Refer to GI for colonoscopy

## 2020-01-27 NOTE — Assessment & Plan Note (Signed)
OUD: 01/27/2020  Lavonia Dana presents for follow up of opioid use disorder I have reviewed the prior induction visit, follow up visits, and telephone encounters relevant to opiate use disorder (OUD) treatment.   Current daily dose: Suboxone 8-2 mg BID  Date of Induction: 02/20/2018  Current follow up interval, in weeks: 4 weeks  The patient has been adherent with the buprenorphine for OUD contract.   Last UDS Result: Appropriate for buprenorphine, inappropriate for EtOH metabolitesHPI:   Mr. Amend states that he has been doing well and has abstained from heroin and amphetamine use.  He states that his last use of heroin was over a year ago.  He continues to drink EtOH socially and smokes marijuana.  His housing situation has stabilized since he started obtaining disability.  He gets about $800 a month and pays $500 in rent.  He has a roommate who drinks heavily however [place] states that he is not influenced by his roommates decisions.  Plan: -Refilled Suboxone 8-2 mg BID

## 2020-01-27 NOTE — Progress Notes (Signed)
   CC: Establish care, follow-up hypertension  HPI:  [name] is a 54 y.o. with medical problems listed below here to establish care.  Please see problem based charting for further details  Past Medical History:  Diagnosis Date  . AIDS (HCC) 1985   . Asthma   . Cerebral hemorrhage (HCC) 2000  . Hemophilia (HCC)   . Hepatitis C    never treated  . HIV (human immunodeficiency virus infection) (HCC)    Allergies: Aspirin Family history: Mother with hypertension and diabetes, father with hypertension Occupation: Disability Social history: Occasional EtOH use, smokes marijuana, smokes cigarette about 1 and half pack a day since 69 years old.  Lives with a roommate  Review of Systems:   Review of Systems  Constitutional: Negative for chills and fever.  HENT: Positive for hearing loss (Left sided (chronic)).        -Dental carries  Eyes: Negative for blurred vision, double vision and photophobia.  Respiratory: Negative for hemoptysis, sputum production and shortness of breath. Cough: Intermittent chronic cough.   Cardiovascular: Negative.   Gastrointestinal: Negative.   Genitourinary:       -Nocturia -Dribbling  Neurological: Positive for tingling (bilateral lower extremities).  Psychiatric/Behavioral: Negative.     Physical Exam:  Vitals:   01/27/20 1428  BP: (!) 148/103  Pulse: 97  Temp: 99.1 F (37.3 C)  TempSrc: Oral  SpO2: 98%  Weight: 182 lb 11.2 oz (82.9 kg)  Height: 5\' 10"  (1.778 m)   Physical Exam  Constitutional: No distress.  Walks with a cane  HENT:  Head: Normocephalic and atraumatic.  Mouth/Throat: He does not have dentures. Dental caries present. No dental abscesses.    Cardiovascular: Normal rate and regular rhythm. Exam reveals no friction rub.  No murmur heard. Pulmonary/Chest: Effort normal and breath sounds normal. No respiratory distress. He has no wheezes.  Abdominal: Soft. Bowel sounds are normal.  Musculoskeletal:      General: No tenderness, deformity or edema.     Cervical back: Neck supple.  Neurological: Cranial nerve deficit: Motor strength intact.  Skin: He is not diaphoretic.  Psychiatric:  Tangential, rambles    Assessment & Plan:   See Encounters Tab for problem based charting.  Patient discussed with Dr. 

## 2020-01-27 NOTE — Assessment & Plan Note (Signed)
BPH: He states this was diagnosed a couple years ago when he began experiencing urinary hesitancy and straining with urination.  He has been maintained on Terazosin 1 mg daily which had improved his symptoms however he recently began experiencing straining with urination, dribbling and nocturia.  Plan: -Increase Terazosin to 2 mg daily -Refer to urology

## 2020-01-27 NOTE — Assessment & Plan Note (Signed)
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  His RPR titer was 1: 512.  He states that he was treated with intramuscular penicillin.  Plan: -Follow-up with infectious disease

## 2020-01-27 NOTE — Patient Instructions (Signed)
Steven [name],   Thanks for seeing [place] today. We are glad to establish care with you. I have refilled your medications. Moving forward, we would slowly take a look at your medications and make adjustments as needed.   Take Care Dr. Dortha Schwalbe  Please call the internal medicine center clinic if you have any questions or concerns, we may be able to help and keep you from a long and expensive emergency room wait. Our clinic and after hours phone number is [phone], the best time to call is Monday through Friday 9 am to 4 pm but there is always someone available 24/7 if you have an emergency. If you need medication refills please notify your pharmacy one week in advance and they will send [place] a request.

## 2020-01-27 NOTE — Assessment & Plan Note (Signed)
HTN: Currently not at goal but he appears tangential and states that he had to walk a while to get to the clinic.  Previously, his BP has been controlled on lisinopril 20 mg with range 100-130s/60s/90s.  BP Readings from Last 3 Encounters:  01/27/20 (!) 148/103  12/30/19 131/81  12/09/19 107/67   Plan: -Continue lisinopril 20 mg for now

## 2020-01-28 NOTE — Progress Notes (Signed)
Internal Medicine Clinic Attending  Case discussed with Dr. Dortha Schwalbe at the time of the visit.  We reviewed the resident's history and exam and pertinent patient test results.  I agree with the assessment, diagnosis, and plan of care documented in the resident's note.   Patient presents for establishment of care for his chronic conditions, in addition he continues on suboxone for OUD.  He is curently on a stable dose with no relapse, I have provided a refill of his suboxone.

## 2020-02-01 ENCOUNTER — Other Ambulatory Visit: Payer: Self-pay | Admitting: Infectious Diseases

## 2020-02-01 DIAGNOSIS — K089 Disorder of teeth and supporting structures, unspecified: Secondary | ICD-10-CM

## 2020-02-12 ENCOUNTER — Other Ambulatory Visit: Payer: Self-pay | Admitting: Infectious Diseases

## 2020-02-12 DIAGNOSIS — B20 Human immunodeficiency virus [HIV] disease: Secondary | ICD-10-CM

## 2020-02-17 ENCOUNTER — Encounter: Payer: Medicaid Other | Admitting: Internal Medicine

## 2020-02-17 NOTE — Addendum Note (Signed)
Addended by: Neomia Dear on: 02/17/2020 05:59 PM   Modules accepted: Orders

## 2020-02-20 ENCOUNTER — Other Ambulatory Visit: Payer: Self-pay | Admitting: Internal Medicine

## 2020-02-20 DIAGNOSIS — F1190 Opioid use, unspecified, uncomplicated: Secondary | ICD-10-CM

## 2020-02-20 DIAGNOSIS — F1199 Opioid use, unspecified with unspecified opioid-induced disorder: Secondary | ICD-10-CM

## 2020-02-20 MED ORDER — BUPRENORPHINE HCL-NALOXONE HCL 8-2 MG SL FILM: 1.0000 | ORAL_FILM | Freq: Two times a day (BID) | SUBLINGUAL | 0 refills | Status: AC

## 2020-02-20 NOTE — Telephone Encounter (Signed)
Needs refill on Buprenorphine HCl-Naloxone HCl (SUBOXONE) 8-2 MG FILM  ;pt contact [phone]   [id number] - [place], [place] - [address] AT [facility] OF [address]

## 2020-02-20 NOTE — Telephone Encounter (Signed)
Will send in 4 weeks.  Please get him an OUD appointment in 2-4 weeks.   Thanks!

## 2020-02-20 NOTE — Telephone Encounter (Signed)
In person

## 2020-02-24 NOTE — Telephone Encounter (Signed)
Spoke with the patient.  Appt has been sch for 03/16/2020 with OUD @ 9:45 am.

## 2020-03-12 ENCOUNTER — Ambulatory Visit: Payer: Medicaid Other | Admitting: Infectious Diseases

## 2020-03-16 ENCOUNTER — Ambulatory Visit: Payer: Medicaid Other

## 2020-03-16 ENCOUNTER — Ambulatory Visit: Payer: Medicaid Other | Admitting: Infectious Diseases

## 2020-04-15 ENCOUNTER — Other Ambulatory Visit: Payer: Self-pay | Admitting: Infectious Diseases

## 2020-04-15 DIAGNOSIS — B20 Human immunodeficiency virus [HIV] disease: Secondary | ICD-10-CM
